# Patient Record
Sex: Male | Born: 1965 | Race: Black or African American | Hispanic: No | Marital: Married | Smoking: Former smoker
Health system: Southern US, Community
[De-identification: ages and names within clinical notes are randomized; demographics above are authoritative.]

## PROBLEM LIST (undated history)

## (undated) VITALS — BP 146/94 | HR 78 | Temp 98.0°F | Resp 18

## (undated) DIAGNOSIS — Z91148 Patient's other noncompliance with medication regimen for other reason: Secondary | ICD-10-CM

## (undated) DIAGNOSIS — Z87891 Personal history of nicotine dependence: Secondary | ICD-10-CM

## (undated) DIAGNOSIS — Z7901 Long term (current) use of anticoagulants: Secondary | ICD-10-CM

## (undated) DIAGNOSIS — I82409 Acute embolism and thrombosis of unspecified deep veins of unspecified lower extremity: Secondary | ICD-10-CM

## (undated) DIAGNOSIS — N400 Enlarged prostate without lower urinary tract symptoms: Secondary | ICD-10-CM

## (undated) DIAGNOSIS — I742 Embolism and thrombosis of arteries of the upper extremities: Secondary | ICD-10-CM

## (undated) DIAGNOSIS — I10 Essential (primary) hypertension: Secondary | ICD-10-CM

## (undated) DIAGNOSIS — Z9114 Patient's other noncompliance with medication regimen: Secondary | ICD-10-CM

## (undated) DIAGNOSIS — F329 Major depressive disorder, single episode, unspecified: Secondary | ICD-10-CM

## (undated) DIAGNOSIS — F32A Depression, unspecified: Secondary | ICD-10-CM

## (undated) DIAGNOSIS — E119 Type 2 diabetes mellitus without complications: Secondary | ICD-10-CM

## (undated) HISTORY — PX: TONSILLECTOMY: SUR1361

---

## 2007-01-27 ENCOUNTER — Emergency Department (HOSPITAL_COMMUNITY): Admission: EM | Admit: 2007-01-27 | Discharge: 2007-01-27 | Payer: Self-pay | Admitting: Emergency Medicine

## 2007-07-26 ENCOUNTER — Emergency Department (HOSPITAL_COMMUNITY): Admission: EM | Admit: 2007-07-26 | Discharge: 2007-07-26 | Payer: Self-pay | Admitting: Family Medicine

## 2007-11-16 ENCOUNTER — Emergency Department (HOSPITAL_COMMUNITY): Admission: EM | Admit: 2007-11-16 | Discharge: 2007-11-17 | Payer: Self-pay | Admitting: Emergency Medicine

## 2009-02-25 ENCOUNTER — Emergency Department (HOSPITAL_COMMUNITY): Admission: EM | Admit: 2009-02-25 | Discharge: 2009-02-25 | Payer: Self-pay | Admitting: Emergency Medicine

## 2010-01-04 ENCOUNTER — Emergency Department (HOSPITAL_COMMUNITY): Admission: EM | Admit: 2010-01-04 | Discharge: 2010-01-05 | Payer: Self-pay | Admitting: Emergency Medicine

## 2010-01-04 ENCOUNTER — Ambulatory Visit: Payer: Self-pay | Admitting: Psychiatry

## 2010-01-05 ENCOUNTER — Inpatient Hospital Stay (HOSPITAL_COMMUNITY)
Admission: EM | Admit: 2010-01-05 | Discharge: 2010-01-10 | Payer: Self-pay | Source: Home / Self Care | Admitting: Psychiatry

## 2010-04-14 ENCOUNTER — Ambulatory Visit (HOSPITAL_COMMUNITY): Payer: Self-pay

## 2010-05-05 ENCOUNTER — Emergency Department (HOSPITAL_COMMUNITY)
Admission: EM | Admit: 2010-05-05 | Discharge: 2010-05-05 | Disposition: A | Discharge status: Home / Self Care | Payer: Self-pay | Attending: Emergency Medicine | Admitting: Emergency Medicine

## 2010-05-05 DIAGNOSIS — S335XXA Sprain of ligaments of lumbar spine, initial encounter: Secondary | ICD-10-CM | POA: Insufficient documentation

## 2010-05-05 DIAGNOSIS — M545 Low back pain, unspecified: Secondary | ICD-10-CM | POA: Insufficient documentation

## 2010-05-05 DIAGNOSIS — X58XXXA Exposure to other specified factors, initial encounter: Secondary | ICD-10-CM | POA: Insufficient documentation

## 2010-05-22 LAB — URINALYSIS, ROUTINE W REFLEX MICROSCOPIC
Bilirubin Urine: NEGATIVE
Nitrite: NEGATIVE
Specific Gravity, Urine: 1.026 (ref 1.005–1.030)
Urobilinogen, UA: 1 mg/dL (ref 0.0–1.0)
pH: 5 (ref 5.0–8.0)

## 2010-05-22 LAB — CBC
HCT: 42.9 % (ref 39.0–52.0)
Hemoglobin: 14.6 g/dL (ref 13.0–17.0)
RDW: 12.7 % (ref 11.5–15.5)
WBC: 6.2 10*3/uL (ref 4.0–10.5)

## 2010-05-22 LAB — BASIC METABOLIC PANEL
Chloride: 105 mEq/L (ref 96–112)
GFR calc non Af Amer: 56 mL/min — ABNORMAL LOW (ref >60)
Potassium: 3.8 mEq/L (ref 3.5–5.1)

## 2010-05-22 LAB — GC/CHLAMYDIA PROBE AMP, URINE

## 2010-05-22 LAB — URINE MICROSCOPIC-ADD ON

## 2010-05-22 LAB — DIFFERENTIAL
Basophils Absolute: 0 10*3/uL (ref 0.0–0.1)
Basophils Relative: 0 % (ref 0–1)
Lymphocytes Relative: 31 % (ref 12–46)
Monocytes Absolute: 0.4 10*3/uL (ref 0.1–1.0)
Monocytes Relative: 6 % (ref 3–12)
Neutro Abs: 3.9 10*3/uL (ref 1.7–7.7)
Neutrophils Relative %: 63 % (ref 43–77)

## 2010-05-22 LAB — HIV ANTIBODY (ROUTINE TESTING W REFLEX): HIV: NONREACTIVE

## 2010-05-22 LAB — RAPID URINE DRUG SCREEN, HOSP PERFORMED
Amphetamines: NOT DETECTED
Opiates: NOT DETECTED
Tetrahydrocannabinol: NOT DETECTED

## 2010-05-22 LAB — ETHANOL

## 2010-05-22 LAB — RPR: RPR Ser Ql: NONREACTIVE

## 2010-06-10 LAB — DIFFERENTIAL
Basophils Absolute: 0 10*3/uL (ref 0.0–0.1)
Basophils Relative: 1 % (ref 0–1)
Lymphocytes Relative: 38 % (ref 12–46)
Monocytes Absolute: 0.5 10*3/uL (ref 0.1–1.0)
Neutro Abs: 3.1 10*3/uL (ref 1.7–7.7)

## 2010-06-10 LAB — BASIC METABOLIC PANEL
BUN: 15 mg/dL (ref 6–23)
Calcium: 9.1 mg/dL (ref 8.4–10.5)
Creatinine, Ser: 1.38 mg/dL (ref 0.4–1.5)
GFR calc non Af Amer: 56 mL/min — ABNORMAL LOW (ref >60)
Glucose, Bld: 96 mg/dL (ref 70–99)
Potassium: 3.5 mEq/L (ref 3.5–5.1)

## 2010-06-10 LAB — POCT CARDIAC MARKERS
Myoglobin, poc: 77.4 ng/mL (ref 12–200)
Troponin i, poc: <0.05 ng/mL (ref 0.00–0.09)
Troponin i, poc: <0.05 ng/mL (ref 0.00–0.09)

## 2010-06-10 LAB — CBC
Hemoglobin: 15 g/dL (ref 13.0–17.0)
MCHC: 34.2 g/dL (ref 30.0–36.0)
Platelets: 242 10*3/uL (ref 150–400)
RDW: 13.6 % (ref 11.5–15.5)

## 2011-01-07 ENCOUNTER — Emergency Department (HOSPITAL_COMMUNITY): Payer: Self-pay

## 2011-01-07 ENCOUNTER — Emergency Department (HOSPITAL_COMMUNITY)
Admission: EM | Admit: 2011-01-07 | Discharge: 2011-01-07 | Disposition: A | Discharge status: Home / Self Care | Payer: Self-pay | Attending: Emergency Medicine | Admitting: Emergency Medicine

## 2011-01-07 DIAGNOSIS — I10 Essential (primary) hypertension: Secondary | ICD-10-CM | POA: Insufficient documentation

## 2011-01-07 DIAGNOSIS — R059 Cough, unspecified: Secondary | ICD-10-CM | POA: Insufficient documentation

## 2011-01-07 DIAGNOSIS — M79609 Pain in unspecified limb: Secondary | ICD-10-CM | POA: Insufficient documentation

## 2011-01-07 DIAGNOSIS — M7989 Other specified soft tissue disorders: Secondary | ICD-10-CM | POA: Insufficient documentation

## 2011-01-07 DIAGNOSIS — R079 Chest pain, unspecified: Secondary | ICD-10-CM | POA: Insufficient documentation

## 2011-01-07 DIAGNOSIS — R05 Cough: Secondary | ICD-10-CM | POA: Insufficient documentation

## 2011-01-07 DIAGNOSIS — F329 Major depressive disorder, single episode, unspecified: Secondary | ICD-10-CM | POA: Insufficient documentation

## 2011-01-07 DIAGNOSIS — R0989 Other specified symptoms and signs involving the circulatory and respiratory systems: Secondary | ICD-10-CM | POA: Insufficient documentation

## 2011-01-07 DIAGNOSIS — Z79899 Other long term (current) drug therapy: Secondary | ICD-10-CM | POA: Insufficient documentation

## 2011-01-07 DIAGNOSIS — R0609 Other forms of dyspnea: Secondary | ICD-10-CM | POA: Insufficient documentation

## 2011-01-07 DIAGNOSIS — J329 Chronic sinusitis, unspecified: Secondary | ICD-10-CM | POA: Insufficient documentation

## 2011-01-07 DIAGNOSIS — R509 Fever, unspecified: Secondary | ICD-10-CM | POA: Insufficient documentation

## 2011-01-07 DIAGNOSIS — J3489 Other specified disorders of nose and nasal sinuses: Secondary | ICD-10-CM | POA: Insufficient documentation

## 2011-01-07 DIAGNOSIS — F3289 Other specified depressive episodes: Secondary | ICD-10-CM | POA: Insufficient documentation

## 2011-01-08 ENCOUNTER — Encounter (HOSPITAL_COMMUNITY): Payer: Self-pay

## 2011-01-08 ENCOUNTER — Inpatient Hospital Stay (HOSPITAL_COMMUNITY)
Admission: EM | Admit: 2011-01-08 | Discharge: 2011-01-16 | DRG: 176 | Disposition: A | Discharge status: Home / Self Care | Payer: Non-veteran care | Attending: Internal Medicine | Admitting: Internal Medicine

## 2011-01-08 ENCOUNTER — Ambulatory Visit (HOSPITAL_COMMUNITY)
Admission: RE | Admit: 2011-01-08 | Discharge: 2011-01-08 | Disposition: A | Discharge status: Home / Self Care | Payer: Non-veteran care | Source: Ambulatory Visit

## 2011-01-08 ENCOUNTER — Encounter (HOSPITAL_COMMUNITY): Payer: Self-pay | Admitting: Radiology

## 2011-01-08 ENCOUNTER — Emergency Department (HOSPITAL_COMMUNITY): Payer: Non-veteran care

## 2011-01-08 DIAGNOSIS — M79609 Pain in unspecified limb: Secondary | ICD-10-CM

## 2011-01-08 DIAGNOSIS — R7309 Other abnormal glucose: Secondary | ICD-10-CM | POA: Diagnosis present

## 2011-01-08 DIAGNOSIS — M7989 Other specified soft tissue disorders: Secondary | ICD-10-CM

## 2011-01-08 DIAGNOSIS — J019 Acute sinusitis, unspecified: Secondary | ICD-10-CM

## 2011-01-08 DIAGNOSIS — Z7901 Long term (current) use of anticoagulants: Secondary | ICD-10-CM

## 2011-01-08 DIAGNOSIS — N400 Enlarged prostate without lower urinary tract symptoms: Secondary | ICD-10-CM | POA: Diagnosis present

## 2011-01-08 DIAGNOSIS — I2699 Other pulmonary embolism without acute cor pulmonale: Principal | ICD-10-CM | POA: Diagnosis present

## 2011-01-08 DIAGNOSIS — I129 Hypertensive chronic kidney disease with stage 1 through stage 4 chronic kidney disease, or unspecified chronic kidney disease: Secondary | ICD-10-CM | POA: Diagnosis present

## 2011-01-08 DIAGNOSIS — I824Z9 Acute embolism and thrombosis of unspecified deep veins of unspecified distal lower extremity: Secondary | ICD-10-CM | POA: Diagnosis present

## 2011-01-08 DIAGNOSIS — I82409 Acute embolism and thrombosis of unspecified deep veins of unspecified lower extremity: Secondary | ICD-10-CM | POA: Diagnosis present

## 2011-01-08 DIAGNOSIS — N182 Chronic kidney disease, stage 2 (mild): Secondary | ICD-10-CM | POA: Diagnosis present

## 2011-01-08 HISTORY — DX: Essential (primary) hypertension: I10

## 2011-01-08 HISTORY — DX: Acute embolism and thrombosis of unspecified deep veins of unspecified lower extremity: I82.409

## 2011-01-08 LAB — DIFFERENTIAL
Basophils Absolute: 0 10*3/uL (ref 0.0–0.1)
Basophils Relative: 0 % (ref 0–1)
Eosinophils Absolute: 0.2 K/uL (ref 0.0–0.7)
Eosinophils Relative: 2 % (ref 0–5)
Lymphocytes Relative: 29 % (ref 12–46)
Lymphs Abs: 2.7 10*3/uL (ref 0.7–4.0)
Monocytes Absolute: 0.7 10*3/uL (ref 0.1–1.0)
Monocytes Relative: 7 % (ref 3–12)
Neutro Abs: 5.8 10*3/uL (ref 1.7–7.7)
Neutrophils Relative %: 62 % (ref 43–77)

## 2011-01-08 LAB — CBC
HCT: 46.5 % (ref 39.0–52.0)
Hemoglobin: 16.2 g/dL (ref 13.0–17.0)
MCH: 32.9 pg (ref 26.0–34.0)
MCHC: 34.8 g/dL (ref 30.0–36.0)
MCV: 94.5 fL (ref 78.0–100.0)
Platelets: 285 K/uL (ref 150–400)
RBC: 4.92 MIL/uL (ref 4.22–5.81)
RDW: 12.6 % (ref 11.5–15.5)
WBC: 9.3 10*3/uL (ref 4.0–10.5)

## 2011-01-08 LAB — BASIC METABOLIC PANEL WITH GFR
BUN: 12 mg/dL (ref 6–23)
Calcium: 10.3 mg/dL (ref 8.4–10.5)
Creatinine, Ser: 1.25 mg/dL (ref 0.50–1.35)
GFR calc non Af Amer: 68 mL/min — ABNORMAL LOW (ref >90)
Glucose, Bld: 100 mg/dL — ABNORMAL HIGH (ref 70–99)
Sodium: 135 meq/L (ref 135–145)

## 2011-01-08 LAB — PROTIME-INR
INR: 0.92 (ref 0.00–1.49)
Prothrombin Time: 12.6 s (ref 11.6–15.2)

## 2011-01-08 LAB — BASIC METABOLIC PANEL
CO2: 28 mEq/L (ref 19–32)
Chloride: 97 mEq/L (ref 96–112)
GFR calc Af Amer: 79 mL/min — ABNORMAL LOW (ref >90)
Potassium: 4.1 mEq/L (ref 3.5–5.1)

## 2011-01-08 LAB — POCT I-STAT TROPONIN I: Troponin i, poc: 0 ng/mL (ref 0.00–0.08)

## 2011-01-08 LAB — APTT: aPTT: 30 seconds (ref 24–37)

## 2011-01-08 MED ORDER — IOHEXOL 350 MG/ML SOLN
80.0000 mL | Freq: Once | INTRAVENOUS | Status: AC | PRN
  Administered 2011-01-08: 80 mL via INTRAVENOUS

## 2011-01-09 DIAGNOSIS — I749 Embolism and thrombosis of unspecified artery: Secondary | ICD-10-CM

## 2011-01-09 LAB — COMPREHENSIVE METABOLIC PANEL
ALT: 38 U/L (ref 0–53)
AST: 22 U/L (ref 0–37)
Alkaline Phosphatase: 67 U/L (ref 39–117)
CO2: 26 mEq/L (ref 19–32)
Calcium: 9.4 mg/dL (ref 8.4–10.5)
Chloride: 99 mEq/L (ref 96–112)
GFR calc Af Amer: 73 mL/min — ABNORMAL LOW (ref >90)
GFR calc non Af Amer: 63 mL/min — ABNORMAL LOW (ref >90)
Glucose, Bld: 102 mg/dL — ABNORMAL HIGH (ref 70–99)
Sodium: 135 mEq/L (ref 135–145)
Total Bilirubin: 0.3 mg/dL (ref 0.3–1.2)

## 2011-01-09 LAB — CBC
Hemoglobin: 13.8 g/dL (ref 13.0–17.0)
MCH: 31.2 pg (ref 26.0–34.0)
MCHC: 33 g/dL (ref 30.0–36.0)
MCV: 94.4 fL (ref 78.0–100.0)

## 2011-01-10 LAB — LUPUS ANTICOAGULANT PANEL
DRVVT: 46.3 secs — ABNORMAL HIGH (ref 34.1–42.2)
Lupus Anticoagulant: NOT DETECTED
PTT Lupus Anticoagulant: 50.9 secs — ABNORMAL HIGH (ref 28.0–43.0)
PTTLA 4:1 Mix: 44.8 secs — ABNORMAL HIGH (ref 28.0–43.0)
PTTLA Confirmation: 3.8 secs (ref <8.0)

## 2011-01-10 LAB — PROTIME-INR
INR: 1.04 (ref 0.00–1.49)
Prothrombin Time: 13.8 s (ref 11.6–15.2)

## 2011-01-10 LAB — BLOOD GAS, ARTERIAL
Bicarbonate: 25.2 mEq/L — ABNORMAL HIGH (ref 20.0–24.0)
TCO2: 26.4 mmol/L (ref 0–100)
pCO2 arterial: 38.8 mmHg (ref 35.0–45.0)
pH, Arterial: 7.429 (ref 7.350–7.450)
pO2, Arterial: 71.7 mmHg — ABNORMAL LOW (ref 80.0–100.0)

## 2011-01-10 LAB — ANTITHROMBIN III: AntiThromb III Func: 100 % (ref 76–126)

## 2011-01-10 LAB — PROTEIN C ACTIVITY: Protein C Activity: 162 % — ABNORMAL HIGH (ref 75–133)

## 2011-01-11 ENCOUNTER — Inpatient Hospital Stay (HOSPITAL_COMMUNITY): Payer: Non-veteran care

## 2011-01-11 LAB — PROTIME-INR
INR: 1.17 (ref 0.00–1.49)
Prothrombin Time: 15.1 seconds (ref 11.6–15.2)

## 2011-01-11 MED ORDER — ACETAMINOPHEN 325 MG PO TABS
650.0000 mg | ORAL_TABLET | ORAL | Status: DC | PRN
Start: 2011-01-11 — End: 2011-01-16

## 2011-01-11 MED ORDER — TRAZODONE 25 MG HALF TABLET
25.0000 mg | ORAL_TABLET | Freq: Every evening | ORAL | Status: DC | PRN
Start: 2011-01-11 — End: 2011-01-16
  Filled 2011-01-11: qty 1

## 2011-01-11 MED ORDER — GUAIFENESIN-DM 100-10 MG/5ML PO SYRP: 5.0000 mL | ORAL_SOLUTION | ORAL | Status: DC | PRN

## 2011-01-11 MED ORDER — TAMSULOSIN HCL 0.4 MG PO CAPS
0.4000 mg | ORAL_CAPSULE | Freq: Every day | ORAL | Status: DC
  Administered 2011-01-12 – 2011-01-15 (×4): 0.4 mg via ORAL
  Filled 2011-01-11 (×6): qty 1

## 2011-01-11 MED ORDER — FLUTICASONE PROPIONATE 50 MCG/ACT NA SUSP
2.0000 | Freq: Every day | NASAL | Status: DC
Start: 2011-01-11 — End: 2011-01-16
  Administered 2011-01-12 – 2011-01-16 (×5): 2 via NASAL

## 2011-01-11 MED ORDER — BISACODYL 10 MG RE SUPP: 10.0000 mg | Freq: Every day | RECTAL | Status: DC | PRN

## 2011-01-11 MED ORDER — ONDANSETRON HCL 4 MG/2ML IJ SOLN: 4.0000 mg | INTRAMUSCULAR | Status: DC | PRN

## 2011-01-11 MED ORDER — LEVOFLOXACIN 750 MG PO TABS
750.0000 mg | ORAL_TABLET | Freq: Every day | ORAL | Status: AC
  Administered 2011-01-12 – 2011-01-15 (×4): 750 mg via ORAL
  Filled 2011-01-11 (×6): qty 1

## 2011-01-11 MED ORDER — ONDANSETRON HCL 4 MG PO TABS: 4.0000 mg | ORAL_TABLET | ORAL | Status: DC | PRN

## 2011-01-11 MED ORDER — SODIUM CHLORIDE 0.9 % IJ SOLN
3.0000 mL | Freq: Two times a day (BID) | INTRAMUSCULAR | Status: DC
  Administered 2011-01-12 – 2011-01-16 (×9): 3 mL via INTRAVENOUS

## 2011-01-11 MED ORDER — ENOXAPARIN SODIUM 100 MG/ML ~~LOC~~ SOLN
90.0000 mg | Freq: Two times a day (BID) | SUBCUTANEOUS | Status: DC
  Administered 2011-01-12 – 2011-01-15 (×8): 90 mg via SUBCUTANEOUS
  Filled 2011-01-11 (×13): qty 1

## 2011-01-11 MED ORDER — DOCUSATE SODIUM 100 MG PO CAPS
100.0000 mg | ORAL_CAPSULE | Freq: Two times a day (BID) | ORAL | Status: DC | PRN
  Filled 2011-01-11: qty 1

## 2011-01-11 MED ORDER — HYDROMORPHONE HCL PF 1 MG/ML IJ SOLN: 0.5000 mg | INTRAMUSCULAR | Status: DC | PRN

## 2011-01-11 MED ORDER — SENNA 8.6 MG PO TABS
2.0000 | ORAL_TABLET | Freq: Every day | ORAL | Status: DC | PRN
  Filled 2011-01-11: qty 2

## 2011-01-11 MED ORDER — OXYCODONE HCL 5 MG PO TABS
5.0000 mg | ORAL_TABLET | ORAL | Status: DC | PRN
  Administered 2011-01-12 – 2011-01-15 (×7): 5 mg via ORAL

## 2011-01-11 NOTE — H&P (Signed)
[name], BUDNICK NO.:  [id number]  MEDICAL RECORD NO.:  [id number]  LOCATION:  4705                         FACILITY:  [facility]  PHYSICIAN:  Vania Rea, M.D. DATE OF BIRTH:  01-03-1966  DATE OF ADMISSION:  01/08/2011 DATE OF DISCHARGE:                             HISTORY & PHYSICAL   PRIMARY CARE PHYSICIAN:  Dr. Carmie Kanner at the [facility] in [place].  CHIEF COMPLAINT:  Swelling of his left leg for the past 8 days.  HISTORY OF PRESENT ILLNESS:  This is a 45 year old African American gentleman who has a history of hypertension and depression but does not know which medications he uses, who reports that he has not been sedentary, has not been on any long journeys, but believes he has a brother with a history of a blood clot in his leg, who reports that he was in good baseline state of health until about 8 or 9 days ago when he developed an upper respiratory infection with coughing and noticed he was also short of breath.  Two days later, he noticed he had pain and swelling of his left leg up to the calf and also some tenderness about the knee.  After seeing no improvement, he called his primary care physician at the [facility] who advised him to come to the emergency room for evaluation.  He presented to the emergency room yesterday where he was felt to have a DVT, received a dose of Lovenox, and was scheduled to have a Doppler ultrasound.  Doppler ultrasound confirmed a DVT and he was called back to the emergency room for further testing since he also gave a history of shortness of breath.  The patient has been in the emergency room since midday.  He eventually had a CT angiogram of the chest which revealed multiple bilateral pulmonary emboli, and the Hospitalist Service was called to assist with management.  The patient does report central chest pain when he coughs.  He is having no hemoptysis, no dizziness or syncope.  No palpitations.  PAST  MEDICAL HISTORY: 1. Hypertension. 2. Depression. 3. History of alcohol abuse. 4. Benign prostatic hypertrophy.  MEDICATIONS:  He reports that he only takes medication for his high blood pressure and a medication for his benign prostatic hypertrophy, but he cannot remember the name.  ALLERGIES:  No known drug allergies.  SOCIAL HISTORY:  Smokes 1 Black and Milds per week.  Admits to drinking 24 ounces of beer 2-3 times per week.  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  He is a Sales executive; left the service in 1996.  FAMILY HISTORY:  Significant for a brother who had a stroke, another brother had diabetes and a blood clots in his legs.  REVIEW OF SYSTEMS:  Other than noted above, unremarkable.  He denies any current depression.  PHYSICAL EXAMINATION:  GENERAL:  Very pleasant, athletic middle-aged African American gentleman, lying in a stretcher, appears mildly dehydrated. VITAL SIGNS:  His temperature is 99, his pulse is 78, respirations 20, blood pressure 130/66.  He is saturating at 94% on  room air. HEENT:  His pupils are round and equal.  Mucous membranes are pink, mildly dry.  Anicteric.  No cervical lymphadenopathy.  No thyromegaly or carotid bruit. CHEST:  Clear to auscultation bilaterally. CARDIOVASCULAR:  Regular rhythm. ABDOMEN:  Soft, nontender.  No masses. EXTREMITIES:  His left leg is tender and tense and swollen at the level of the knee with marked calf tenderness, but dorsalis pedis pulses are 2+ bilaterally.  He does have dilated veins in the left leg. CENTRAL NERVOUS SYSTEM:  Cranial nerves II through XII are grossly intact.  He has no focal lateralizing signs.  LABORATORY DATA:  His white count is 9.3, hemoglobin 16.2, platelets 285.  His sodium is 135, potassium 4.1, chloride 97, CO2 28, glucose 100, BUN 12 creatinine 1.25, calcium is 10.3.  His PT is 12.6 and his PTT is 30.  His  troponin is undetectable.  CT angiogram of his chest shows multiple bilateral pulmonary emboli, particularly in the lower lobe pulmonary arterial tree, but right more than left.  ASSESSMENTS: 1. Venous thromboembolism with bilateral pulmonary emboli and left     lower extremity deep vein thrombosis, unclear precipitating     factors. 2. Hypertension.  Medications unknown. 3. History of depression. 4. History of benign prostatic hypertrophy.  PLAN: 1. We will admit this gentleman for anticoagulation and     hypercoagulability workup. 2. We will also ensure adequate pain control. 3. We will have pharmacy track down his medications and give     prescriptions and adjustments as indicated. 4. Other plans as per orders.     Vania Rea, M.D.     LC/MEDQ  D:  01/09/2011  T:  01/09/2011  Job:  [id number]  cc:   Dr. Carmie Kanner  Electronically Signed by Vania Rea M.D. on 01/11/2011 12:27:44 PM

## 2011-01-12 ENCOUNTER — Encounter (HOSPITAL_COMMUNITY): Payer: Self-pay | Admitting: Internal Medicine

## 2011-01-12 DIAGNOSIS — I82409 Acute embolism and thrombosis of unspecified deep veins of unspecified lower extremity: Secondary | ICD-10-CM

## 2011-01-12 DIAGNOSIS — I2699 Other pulmonary embolism without acute cor pulmonale: Secondary | ICD-10-CM | POA: Diagnosis present

## 2011-01-12 DIAGNOSIS — J019 Acute sinusitis, unspecified: Secondary | ICD-10-CM

## 2011-01-12 HISTORY — DX: Acute embolism and thrombosis of unspecified deep veins of unspecified lower extremity: I82.409

## 2011-01-12 LAB — PROTIME-INR
INR: 1.38 (ref 0.00–1.49)
Prothrombin Time: 17.2 seconds — ABNORMAL HIGH (ref 11.6–15.2)

## 2011-01-12 LAB — CBC
Platelets: 345 10*3/uL (ref 150–400)
RBC: 4.79 MIL/uL (ref 4.22–5.81)
WBC: 6.6 10*3/uL (ref 4.0–10.5)

## 2011-01-12 MED ORDER — WARFARIN SODIUM 2.5 MG PO TABS
12.5000 mg | ORAL_TABLET | Freq: Once | ORAL | Status: AC
  Administered 2011-01-12: 12.5 mg via ORAL
  Filled 2011-01-12: qty 1

## 2011-01-12 NOTE — Progress Notes (Signed)
Subjective: [name] was admitted on October 31 for swelling and pain in his left leg and dyspnea. He was diagnosed to have left lower extremity deep vein thrombosis and acute bilateral pulmonary embolism confirmed on CT angiogram of the chest. He has been started on Lovenox full dose bridging and Coumadin. Dyspnea on exertion continues to improve daily. He denies chest pains. His left leg pain and swelling are decreased. He still has some pain on weightbearing. Yesterday his headaches are improved. He feels less stuffy in his nose and head.  Objective: Blood pressure 127/71, pulse 86, temperature 98.7 F (37.1 C), resp. rate 18, height 5' 7.5" (1.715 m), weight 89.585 kg (197 lb 8 oz).  Intake/Output Summary (Last 24 hours) at 01/12/11 0956 Last data filed at 01/12/11 0700  Gross per 24 hour  Intake    240 ml  Output    851 ml  Net   -611 ml   Gen. exam: Comfortable with no increased work of breathing Respiratory system: clear Cardiovascular system: First and second heart sounds heard, regular Extremities: Left leg pain and swelling are improved. He still has mild tenderness and cordlike feeling in the medial aspect of the left lower thigh. Peripheral pulses are well felt symmetrically. Color is normal.  Lab Results: Basic Metabolic Panel: No results found for this basename: NA:2,K:2,CL:2,CO2:2,GLUCOSE:2,BUN:2,CREATININE:2,CALCIUM:2,MG:2,PHOS:2 in the last 72 hours Liver Function Tests: No results found for this basename: AST:2,ALT:2,ALKPHOS:2,BILITOT:2,PROT:2,ALBUMIN:2 in the last 72 hours No results found for this basename: LIPASE:2,AMYLASE:2 in the last 72 hours No results found for this basename: AMMONIA:2 in the last 72 hours CBC:  Basename 01/12/11 0655  WBC 6.6  NEUTROABS --  HGB 15.5  HCT 44.6  MCV 93.1  PLT 345   Cardiac Enzymes: No results found for this basename: CKTOTAL:3,CKMB:3,CKMBINDEX:3,TROPONINI:3 in the last 72 hours BNP: No results found for this  basename: POCBNP:3 in the last 72 hours D-Dimer: No results found for this basename: DDIMER:2 in the last 72 hours CBG: No results found for this basename: GLUCAP:6 in the last 72 hours Hemoglobin A1C: No results found for this basename: HGBA1C in the last 72 hours Fasting Lipid Panel: No results found for this basename: CHOL,HDL,LDLCALC,TRIG,CHOLHDL,LDLDIRECT in the last 72 hours Thyroid Function Tests: No results found for this basename: TSH,T4TOTAL,FREET4,T3FREE,THYROIDAB in the last 72 hours Anemia Panel: No results found for this basename: VITAMINB12,FOLATE,FERRITIN,TIBC,IRON,RETICCTPCT in the last 72 hours Coagulation:  Basename 01/11/11 0630 01/10/11 0630  INR 1.17 1.04   Urine Drug Screen:  Alcohol Level: No results found for this basename: ETH:2 in the last 72 hours Urinalysis:  Misc. Labs:   Micro Results: No results found for this or any previous visit (from the past 240 hour(s)).  Studies/Results: Dg Chest 2 View  01/07/2011  *RADIOLOGY REPORT*  Clinical Data: Cough, hypertension.  CHEST - 2 VIEW  Comparison: 02/25/2009  Findings: Heart and mediastinal contours are within normal limits. No focal opacities or effusions.  No acute bony abnormality.  IMPRESSION: No active cardiopulmonary disease.  Original Report Authenticated By: Cyndie Chime, M.D.   Ct Head Wo Contrast  01/11/2011  *RADIOLOGY REPORT*  Clinical Data: Right frontal headache, eye pain  CT HEAD WITHOUT CONTRAST  Technique:  Contiguous axial images were obtained from the base of the skull through the vertex without contrast.  Comparison: None.  Findings: No evidence of parenchymal hemorrhage or extra-axial fluid collection. No mass lesion, mass effect, or midline shift.  No CT evidence of acute infarction.  Cerebral volume is age appropriate.  No ventriculomegaly.  Near complete opacification of the left maxillary sinus.  Fluid level in the right maxillary sinus.  Partial opacification of the ethmoid  sinuses.  Mastoid air cells are clear.  No evidence of calvarial fracture.  IMPRESSION: No evidence of acute intracranial abnormality.  Partial opacification of the bilateral maxillary and ethmoid sinuses, as described above.  Original Report Authenticated By: Charline Bills, M.D.   Ct Angio Chest W/cm &/or Wo Cm  01/08/2011  *RADIOLOGY REPORT*  Clinical Data:  Left leg DVT.  Chest pain and shortness of breath.  CT ANGIOGRAPHY CHEST WITH CONTRAST  Technique:  Multidetector CT imaging of the chest was performed using the standard protocol during bolus administration of intravenous contrast.  Multiplanar CT image reconstructions including MIPs were obtained to evaluate the vascular anatomy.  Contrast: 80mL OMNIPAQUE IOHEXOL 350 MG/ML IV SOLN  Comparison:  Chest radiography 10/13  Findings:  The study is positive for multiple bilateral pulmonary emboli, most pronounced in the lower lobe arterial tree right more than left.  The lungs remain clear.  No collapse, effusion or infarction.  No pericardial fluid.  The aorta appears unremarkable. No hilar mediastinal mass.  Upper abdominal structures appear normal as seen.  Review of the MIP images confirms the above findings.  IMPRESSION: The study is positive for multiple bilateral pulmonary emboli, particularly in the lower lobe pulmonary arterial tree and right more than left.Critical Value/emergent results were called by telephone at the time of interpretation on 01/08/2011  at 2140 hours  to  [facility] EDP, who verbally acknowledged these results.  Original Report Authenticated By: Thomasenia Sales, M.D.    Medications: Scheduled Meds:   . enoxaparin (LOVENOX) injection  90 mg Subcutaneous Q12H  . fluticasone  2 spray Each Nare Daily  . levofloxacin  750 mg Oral Daily  . sodium chloride  3 mL Intravenous Q12H  . Tamsulosin HCl  0.4 mg Oral QHS   Continuous Infusions:  PRN Meds:.acetaminophen, bisacodyl, docusate sodium, guaiFENesin-dextromethorphan,  HYDROmorphone (DILAUDID) injection, ondansetron, ondansetron, oxyCODONE, senna, traZODone  Assessment/Plan: Patient Active Hospital Problem List: 1. Acute bilateral pulmonary embolism: Hemodynamically stable. Day 4 for 5 Lovenox bridging. Continue full dose Lovenox and Coumadin as per pharmacy recommendations. 2. Left lower extremity deep vein thrombosis: Continue anticoagulation as above 3. Acute sinusitis: Possibly cause of his headaches. No other acute intracranial abnormality on CT. Patient is day 2 of 5 days levofloxacin. Headache is better. 4. Chronic kidney disease stage II: Stable 5. Hypertension: Controlled without medications.  No active hospital problems.    Tery Hoeger 01/12/2011, 9:56 AM

## 2011-01-12 NOTE — Progress Notes (Signed)
ANTICOAGULATION CONSULT NOTE - Follow Up Consult  Pharmacy Consult for Lovenox/coumadin Indication: pulmonary embolus and DVT  No Known Allergies  Patient Measurements: Height: 5' 7.5" (171.5 cm) (Entered for [employer]) Weight: 197 lb 8 oz (89.585 kg) IBW/kg (Calculated) : 67.25   Vital Signs: Temp: 98.7 F (37.1 C) (11/04 0500) BP: 127/71 mmHg (11/04 0500) Pulse Rate: 86  (11/04 0500)  Labs:  Basename 01/12/11 1020 01/12/11 0655 01/11/11 0630 01/10/11 0630  HGB -- 15.5 -- --  HCT -- 44.6 -- --  PLT -- 345 -- --  APTT -- -- -- --  LABPROT 17.2* -- 15.1 13.8  INR 1.38 -- 1.17 1.04  HEPARINUNFRC -- -- -- --  CREATININE -- -- -- --  CKTOTAL -- -- -- --  CKMB -- -- -- --  TROPONINI -- -- -- --   Estimated Creatinine Clearance: 75.6 ml/min (by C-G formula based on Cr of 1.33).   Medications:   Assessment: 44 YOM on D#5 Lovenox bridge to coumadin for acute B/L PE and LLE DVT, INR remains subtherapeutic but trending up. CBC stable, no bleeding noted. Pt. Renal fx has been stable.  Goal of Therapy:  INR 2-3   Plan:  1) Continue Lovenox 90mg  sq q12hr 2) Give coumadin 12.5mg  this evening 3) Continue to f/u PT/INR, and d/c lovenox when INR is > 2 for 24 hrs  Riki Rusk 01/12/2011,11:44 AM

## 2011-01-13 LAB — PROTIME-INR
INR: 1.43 (ref 0.00–1.49)
Prothrombin Time: 17.7 seconds — ABNORMAL HIGH (ref 11.6–15.2)

## 2011-01-13 MED ORDER — WARFARIN SODIUM 7.5 MG PO TABS
15.0000 mg | ORAL_TABLET | Freq: Once | ORAL | Status: AC
  Administered 2011-01-13: 15 mg via ORAL
  Filled 2011-01-13: qty 2

## 2011-01-13 NOTE — Progress Notes (Signed)
ANTICOAGULATION CONSULT NOTE - Follow Up Consult  Pharmacy Consult for Lovenox/Coumadin Indication: Bilateral PE, DVT  No Known Allergies  Patient Measurements: Height: 5' 7.5" (171.5 cm) (Entered for [employer]) Weight: 196 lb 14.4 oz (89.313 kg) IBW/kg (Calculated) : 67.25  Adjusted Body Weight  Vital Signs: Temp: 98 F (36.7 C) (11/05 0530) Temp src: Oral (11/05 0530) BP: 123/84 mmHg (11/05 0530) Pulse Rate: 79  (11/05 0530)  Labs:  Basename 01/13/11 0605 01/12/11 1020 01/12/11 0655 01/11/11 0630  HGB -- -- 15.5 --  HCT -- -- 44.6 --  PLT -- -- 345 --  APTT -- -- -- --  LABPROT 17.7* 17.2* -- 15.1  INR 1.43 1.38 -- 1.17  HEPARINUNFRC -- -- -- --  CREATININE -- -- -- --  CKTOTAL -- -- -- --  CKMB -- -- -- --  TROPONINI -- -- -- --   Estimated Creatinine Clearance: 75.5 ml/min (by C-G formula based on Cr of 1.33).  Goal of Therapy:  INR 2-3  Assessment: 44yom on Lovenox bridging to Coumadin (Day 6) for bilateral PE and DVT treatment. INR remains subtherapeutic and not responding significantly to Coumadin 12.5mg  doses.No bleeding reported. Wt 89kg, CrCl 17ml/min   Plan:  1. Coumadin 15mg  po x1 today 2. Continue Lovenox 90mg  SQ q12h 3. F/U AM INR  Cleon Dew 01/13/2011,9:17 AM

## 2011-01-13 NOTE — Progress Notes (Signed)
PT SEES DR. SALMON AT [facility], CHECKING TO MAKE SURE SHE CAN FOLLOW OP LABS.  WILL F/U.  Willa Rough [phone] OR [phone] 01/13/2011

## 2011-01-13 NOTE — Progress Notes (Signed)
Subjective:   Overall patient indicates that he feels better. There is improvement in his headaches, dyspnea and left leg pain. He still has dyspnea on exertion on ambulating to the bathroom or talking a lot.  [name] was admitted on October 31 for swelling and pain in his left leg and dyspnea. He was diagnosed to have left lower extremity deep vein thrombosis and acute bilateral pulmonary embolism confirmed on CT angiogram of the chest. He has been started on Lovenox full dose bridging and Coumadin.  Objective: Blood pressure 140/85, pulse 93, temperature 98.6 F (37 C), temperature source Oral, resp. rate 18, height 5' 7.5" (1.715 m), weight 89.313 kg (196 lb 14.4 oz), SpO2 98.00%.  Intake/Output Summary (Last 24 hours) at 01/13/11 1740 Last data filed at 01/13/11 1500  Gross per 24 hour  Intake    723 ml  Output   1050 ml  Net   -327 ml   Gen. exam: Comfortable with no increased work of breathing Respiratory system: clear Cardiovascular system: First and second heart sounds heard, regular Extremities: Left leg pain and swelling are improved. He still has mild tenderness and cordlike feeling in the medial aspect of the left lower thigh. Peripheral pulses are well felt symmetrically. Color is normal.  Lab Results: Basic Metabolic Panel: No results found for this basename: NA:2,K:2,CL:2,CO2:2,GLUCOSE:2,BUN:2,CREATININE:2,CALCIUM:2,MG:2,PHOS:2 in the last 72 hours Liver Function Tests: No results found for this basename: AST:2,ALT:2,ALKPHOS:2,BILITOT:2,PROT:2,ALBUMIN:2 in the last 72 hours No results found for this basename: LIPASE:2,AMYLASE:2 in the last 72 hours No results found for this basename: AMMONIA:2 in the last 72 hours CBC:  Basename 01/12/11 0655  WBC 6.6  NEUTROABS --  HGB 15.5  HCT 44.6  MCV 93.1  PLT 345   Cardiac Enzymes: No results found for this basename: CKTOTAL:3,CKMB:3,CKMBINDEX:3,TROPONINI:3 in the last 72 hours BNP: No results found for this  basename: POCBNP:3 in the last 72 hours D-Dimer: No results found for this basename: DDIMER:2 in the last 72 hours CBG: No results found for this basename: GLUCAP:6 in the last 72 hours Hemoglobin A1C: No results found for this basename: HGBA1C in the last 72 hours Fasting Lipid Panel: No results found for this basename: CHOL,HDL,LDLCALC,TRIG,CHOLHDL,LDLDIRECT in the last 72 hours Thyroid Function Tests: No results found for this basename: TSH,T4TOTAL,FREET4,T3FREE,THYROIDAB in the last 72 hours Anemia Panel: No results found for this basename: VITAMINB12,FOLATE,FERRITIN,TIBC,IRON,RETICCTPCT in the last 72 hours Coagulation:  Basename 01/13/11 0605 01/12/11 1020  INR 1.43 1.38   Urine Drug Screen:  Alcohol Level: No results found for this basename: ETH:2 in the last 72 hours Urinalysis:  Misc. Labs:   Micro Results: No results found for this or any previous visit (from the past 240 hour(s)).  Studies/Results: Dg Chest 2 View  01/07/2011  *RADIOLOGY REPORT*  Clinical Data: Cough, hypertension.  CHEST - 2 VIEW  Comparison: 02/25/2009  Findings: Heart and mediastinal contours are within normal limits. No focal opacities or effusions.  No acute bony abnormality.  IMPRESSION: No active cardiopulmonary disease.  Original Report Authenticated By: Cyndie Chime, M.D.   Ct Head Wo Contrast  01/11/2011  *RADIOLOGY REPORT*  Clinical Data: Right frontal headache, eye pain  CT HEAD WITHOUT CONTRAST  Technique:  Contiguous axial images were obtained from the base of the skull through the vertex without contrast.  Comparison: None.  Findings: No evidence of parenchymal hemorrhage or extra-axial fluid collection. No mass lesion, mass effect, or midline shift.  No CT evidence of acute infarction.  Cerebral volume is age appropriate.  No ventriculomegaly.  Near complete opacification of the left maxillary sinus.  Fluid level in the right maxillary sinus.  Partial opacification of the ethmoid  sinuses.  Mastoid air cells are clear.  No evidence of calvarial fracture.  IMPRESSION: No evidence of acute intracranial abnormality.  Partial opacification of the bilateral maxillary and ethmoid sinuses, as described above.  Original Report Authenticated By: Charline Bills, M.D.   Ct Angio Chest W/cm &/or Wo Cm  01/08/2011  *RADIOLOGY REPORT*  Clinical Data:  Left leg DVT.  Chest pain and shortness of breath.  CT ANGIOGRAPHY CHEST WITH CONTRAST  Technique:  Multidetector CT imaging of the chest was performed using the standard protocol during bolus administration of intravenous contrast.  Multiplanar CT image reconstructions including MIPs were obtained to evaluate the vascular anatomy.  Contrast: 80mL OMNIPAQUE IOHEXOL 350 MG/ML IV SOLN  Comparison:  Chest radiography 10/13  Findings:  The study is positive for multiple bilateral pulmonary emboli, most pronounced in the lower lobe arterial tree right more than left.  The lungs remain clear.  No collapse, effusion or infarction.  No pericardial fluid.  The aorta appears unremarkable. No hilar mediastinal mass.  Upper abdominal structures appear normal as seen.  Review of the MIP images confirms the above findings.  IMPRESSION: The study is positive for multiple bilateral pulmonary emboli, particularly in the lower lobe pulmonary arterial tree and right more than left.Critical Value/emergent results were called by telephone at the time of interpretation on 01/08/2011  at 2140 hours  to  [facility] EDP, who verbally acknowledged these results.  Original Report Authenticated By: Thomasenia Sales, M.D.    Medications: Scheduled Meds:    . enoxaparin (LOVENOX) injection  90 mg Subcutaneous Q12H  . fluticasone  2 spray Each Nare Daily  . levofloxacin  750 mg Oral Daily  . sodium chloride  3 mL Intravenous Q12H  . Tamsulosin HCl  0.4 mg Oral QHS  . warfarin  12.5 mg Oral ONCE-1800  . warfarin  15 mg Oral ONCE-1800   Continuous Infusions:  PRN  Meds:.acetaminophen, bisacodyl, docusate sodium, guaiFENesin-dextromethorphan, HYDROmorphone (DILAUDID) injection, ondansetron, ondansetron, oxyCODONE, senna, traZODone  Assessment/Plan: Patient Active Hospital Problem List: 1. Acute bilateral pulmonary embolism: Hemodynamically stable. Day 5 for 5 Lovenox bridging. Continue full dose Lovenox and Coumadin as per pharmacy recommendations. The INR has finally started to rise. He should be therapeutic in in the next 48 hours. 2. Left lower extremity deep vein thrombosis: Continue anticoagulation as above 3. Acute sinusitis: Possibly cause of his headaches. No other acute intracranial abnormality on CT. Patient is day 3 of 5 days levofloxacin. Headache is better. 4. Chronic kidney disease stage II: Stable 5.    Hypertension: Controlled without medications.   Disposition: Patient should be able to discharge on 7 November.  [name],[name] 01/13/2011, 5:40 PM

## 2011-01-14 LAB — CBC
HCT: 43.2 % (ref 39.0–52.0)
Hemoglobin: 15.7 g/dL (ref 13.0–17.0)
MCH: 33.6 pg (ref 26.0–34.0)
MCHC: 36.3 g/dL — ABNORMAL HIGH (ref 30.0–36.0)

## 2011-01-14 LAB — CARDIOLIPIN ANTIBODIES, IGG, IGM, IGA
Anticardiolipin IgG: 8 GPL U/mL — ABNORMAL LOW (ref <23)
Anticardiolipin IgM: 5 MPL U/mL — ABNORMAL LOW (ref <11)

## 2011-01-14 MED ORDER — WARFARIN SODIUM 7.5 MG PO TABS
15.0000 mg | ORAL_TABLET | Freq: Once | ORAL | Status: AC
  Administered 2011-01-14: 15 mg via ORAL
  Filled 2011-01-14: qty 2

## 2011-01-14 MED ORDER — LORATADINE 10 MG PO TABS
10.0000 mg | ORAL_TABLET | Freq: Every day | ORAL | Status: DC
  Administered 2011-01-14 – 2011-01-16 (×4): 10 mg via ORAL
  Filled 2011-01-14 (×4): qty 1

## 2011-01-14 NOTE — Progress Notes (Signed)
Summary: [name] was admitted on October 31 for swelling and pain in his left leg and dyspnea. He had dyspnea both at rest and on minimal exertion and left lower extremity pain. He was diagnosed to have left lower extremity deep vein thrombosis and acute bilateral pulmonary embolism confirmed on CT angiogram of the chest. He has been started on Lovenox full dose bridging and Coumadin. He is day #7 of Lovenox Coumadin bridging. His dyspnea on exertion and left lower extremity pain continue to improve but have not fully resolved.Marland Kitchen He was managed as inpatient because of how severely symptomatic he was on admission. He continues to improve and should be stable for discharge in the next 24-48 hours. His Lovenox will be discontinued after his INR has been therapeutic on 2 consecutive days. He has symptoms suggestive of chronic sinusitis but had worsening nasal congestion and headache on admission attribute it to possible acute sinusitis. This is improving with antibiotics. Patient follows up with primary care physician at the [facility]. Arrangements are to be made on discharge for close monitoring of his INR as outpatient.     Subjective: Dyspnea on exertion, left lower extremity pain and headache all continued to improve. He has some dyspnea on exertion on ambulating to the bathroom. Left lower extremity pain is more on ambulation. He complains of some postnasal drip but nasal stuffiness has improved.  Objective: Blood pressure 127/82, pulse 89, temperature 98.5 F (36.9 C), temperature source Oral, resp. rate 18, height 5' 7.5" (1.715 m), weight 90.1 kg (198 lb 10.2 oz), SpO2 96.00%.  Intake/Output Summary (Last 24 hours) at 01/14/11 1325 Last data filed at 01/14/11 1100  Gross per 24 hour  Intake    603 ml  Output   1575 ml  Net   -972 ml   Gen. exam: Comfortable and in no obvious distress. Respiratory system: Clear. Cardiovascular system: First and second heart sounds heard regular. Nor  JVD. Extremities: Left lower extremity swelling has resolved. It is too asymmetrically mildly warm but improved. Mild tenderness in the medial left lower thigh. Central nervous system: Awake alert and oriented  Lab Results: Basic Metabolic Panel: No results found for this basename: NA:2,K:2,CL:2,CO2:2,GLUCOSE:2,BUN:2,CREATININE:2,CALCIUM:2,MG:2,PHOS:2 in the last 72 hours Liver Function Tests: No results found for this basename: AST:2,ALT:2,ALKPHOS:2,BILITOT:2,PROT:2,ALBUMIN:2 in the last 72 hours No results found for this basename: LIPASE:2,AMYLASE:2 in the last 72 hours No results found for this basename: AMMONIA:2 in the last 72 hours CBC:  Basename 01/14/11 0635 01/12/11 0655  WBC 6.0 6.6  NEUTROABS -- --  HGB 15.7 15.5  HCT 43.2 44.6  MCV 92.5 93.1  PLT 342 345   Cardiac Enzymes: No results found for this basename: CKTOTAL:3,CKMB:3,CKMBINDEX:3,TROPONINI:3 in the last 72 hours BNP: No results found for this basename: POCBNP:3 in the last 72 hours D-Dimer: No results found for this basename: DDIMER:2 in the last 72 hours CBG: No results found for this basename: GLUCAP:6 in the last 72 hours Hemoglobin A1C: No results found for this basename: HGBA1C in the last 72 hours Fasting Lipid Panel: No results found for this basename: CHOL,HDL,LDLCALC,TRIG,CHOLHDL,LDLDIRECT in the last 72 hours Thyroid Function Tests: No results found for this basename: TSH,T4TOTAL,FREET4,T3FREE,THYROIDAB in the last 72 hours Anemia Panel: No results found for this basename: VITAMINB12,FOLATE,FERRITIN,TIBC,IRON,RETICCTPCT in the last 72 hours Coagulation:  Basename 01/14/11 0635 01/13/11 0605  LABPROT 20.2* 17.7*  INR 1.69* 1.43   Urine Drug Screen:  Alcohol Level: No results found for this basename: ETH:2 in the last 72 hours Urinalysis:  Misc. Labs:   Micro Results: No results found for this or any previous visit (from the past 240 hour(s)).  Studies/Results: Dg Chest 2  View  01/07/2011  *RADIOLOGY REPORT*  Clinical Data: Cough, hypertension.  CHEST - 2 VIEW  Comparison: 02/25/2009  Findings: Heart and mediastinal contours are within normal limits. No focal opacities or effusions.  No acute bony abnormality.  IMPRESSION: No active cardiopulmonary disease.  Original Report Authenticated By: Cyndie Chime, M.D.   Ct Head Wo Contrast  01/11/2011  *RADIOLOGY REPORT*  Clinical Data: Right frontal headache, eye pain  CT HEAD WITHOUT CONTRAST  Technique:  Contiguous axial images were obtained from the base of the skull through the vertex without contrast.  Comparison: None.  Findings: No evidence of parenchymal hemorrhage or extra-axial fluid collection. No mass lesion, mass effect, or midline shift.  No CT evidence of acute infarction.  Cerebral volume is age appropriate.  No ventriculomegaly.  Near complete opacification of the left maxillary sinus.  Fluid level in the right maxillary sinus.  Partial opacification of the ethmoid sinuses.  Mastoid air cells are clear.  No evidence of calvarial fracture.  IMPRESSION: No evidence of acute intracranial abnormality.  Partial opacification of the bilateral maxillary and ethmoid sinuses, as described above.  Original Report Authenticated By: Charline Bills, M.D.   Ct Angio Chest W/cm &/or Wo Cm  01/08/2011  *RADIOLOGY REPORT*  Clinical Data:  Left leg DVT.  Chest pain and shortness of breath.  CT ANGIOGRAPHY CHEST WITH CONTRAST  Technique:  Multidetector CT imaging of the chest was performed using the standard protocol during bolus administration of intravenous contrast.  Multiplanar CT image reconstructions including MIPs were obtained to evaluate the vascular anatomy.  Contrast: 80mL OMNIPAQUE IOHEXOL 350 MG/ML IV SOLN  Comparison:  Chest radiography 10/13  Findings:  The study is positive for multiple bilateral pulmonary emboli, most pronounced in the lower lobe arterial tree right more than left.  The lungs remain clear.  No  collapse, effusion or infarction.  No pericardial fluid.  The aorta appears unremarkable. No hilar mediastinal mass.  Upper abdominal structures appear normal as seen.  Review of the MIP images confirms the above findings.  IMPRESSION: The study is positive for multiple bilateral pulmonary emboli, particularly in the lower lobe pulmonary arterial tree and right more than left.Critical Value/emergent results were called by telephone at the time of interpretation on 01/08/2011  at 2140 hours  to  [facility] EDP, who verbally acknowledged these results.  Original Report Authenticated By: Thomasenia Sales, M.D.    Medications: Scheduled Meds:   . enoxaparin (LOVENOX) injection  90 mg Subcutaneous Q12H  . fluticasone  2 spray Each Nare Daily  . levofloxacin  750 mg Oral Daily  . loratadine  10 mg Oral Daily  . sodium chloride  3 mL Intravenous Q12H  . Tamsulosin HCl  0.4 mg Oral QHS  . warfarin  15 mg Oral ONCE-1800  . warfarin  15 mg Oral ONCE-1800   Continuous Infusions:  PRN Meds:.acetaminophen, bisacodyl, docusate sodium, guaiFENesin-dextromethorphan, HYDROmorphone (DILAUDID) injection, ondansetron, ondansetron, oxyCODONE, senna, traZODone  Assessment/Plan Patient Active Hospital Problem List: 1. Acute bilateral pulmonary embolism: Hemodynamically stable. INR continues to gradually increase. Discontinue Lovenox after his INR is therapeutic for 2 consecutive days. 2. Left lower extremity deep vein thrombosis: Continue anticoagulation as above 3. Acute sinusitis: Possibly cause of his headaches. No other acute intracranial abnormality on CT. Patient is day 4 of 5 days levofloxacin. Headache is better.  4. Chronic kidney disease stage II: Stable 5. Hypertension: Controlled without medications.    [name] 01/14/2011, 1:25 PM

## 2011-01-14 NOTE — Progress Notes (Signed)
ANTICOAGULATION CONSULT NOTE - Follow Up Consult  Pharmacy Consult for Lovenox and Coumadin Indication: pulmonary embolus and DVT  No Known Allergies  Patient Measurements: Height: 5' 7.5" (171.5 cm) (Entered for Cutover) Weight: 198 lb 10.2 oz (90.1 kg) (scale a) IBW/kg (Calculated) : 67.25  Adjusted Body Weight:   Vital Signs: Temp: 98.5 F (36.9 C) (11/06 0921) Temp src: Oral (11/06 0921) BP: 127/82 mmHg (11/06 0921) Pulse Rate: 89  (11/06 0921)  Labs:  Basename 01/14/11 0635 01/13/11 0605 01/12/11 1020 01/12/11 0655  HGB 15.7 -- -- 15.5  HCT 43.2 -- -- 44.6  PLT 342 -- -- 345  APTT -- -- -- --  LABPROT 20.2* 17.7* 17.2* --  INR 1.69* 1.43 1.38 --  HEPARINUNFRC -- -- -- --  CREATININE -- -- -- --  CKTOTAL -- -- -- --  CKMB -- -- -- --  TROPONINI -- -- -- --   Estimated Creatinine Clearance: 75.8 ml/min (by C-G formula based on Cr of 1.33).   Medications:  Scheduled:    . enoxaparin (LOVENOX) injection  90 mg Subcutaneous Q12H  . fluticasone  2 spray Each Nare Daily  . levofloxacin  750 mg Oral Daily  . loratadine  10 mg Oral Daily  . sodium chloride  3 mL Intravenous Q12H  . Tamsulosin HCl  0.4 mg Oral QHS  . warfarin  15 mg Oral ONCE-1800    Assessment: 44yom on Lovenox bridging to Coumadin (Day 7) for bilateral PE and DVT treatment. INR is subtherapeutic (1.69) but trended up appropriately with 15mg  dose.  - Wt 90kg - CrCl 68ml/min  Goal of Therapy:  INR 2-3; Anti-Xa level: 0.6-1.2   Plan:  1. Coumadin 15mg  po x 1 2. Continue Lovenox 90mg  (1mg /kg) SQ q12h 3. Check BMET in AM  Cleon Dew [phone] 01/14/2011,11:42 AM

## 2011-01-14 NOTE — Progress Notes (Signed)
Clinical Social Work completed the psychosocial assessment which can be found in the shadow chart. Pt given resources for [employer] and the [employer] for financial resources while patient is out of work. No further CSW needs, signing off.   Catha Gosselin,  Theresia Majors [phone]

## 2011-01-15 ENCOUNTER — Encounter (HOSPITAL_COMMUNITY): Payer: Self-pay | Admitting: *Deleted

## 2011-01-15 LAB — BASIC METABOLIC PANEL
BUN: 17 mg/dL (ref 6–23)
CO2: 25 mEq/L (ref 19–32)
Calcium: 9.3 mg/dL (ref 8.4–10.5)
Creatinine, Ser: 1.32 mg/dL (ref 0.50–1.35)
GFR calc Af Amer: 74 mL/min — ABNORMAL LOW (ref >90)

## 2011-01-15 LAB — PROTIME-INR
INR: 1.96 — ABNORMAL HIGH (ref 0.00–1.49)
Prothrombin Time: 22.7 seconds — ABNORMAL HIGH (ref 11.6–15.2)

## 2011-01-15 MED ORDER — WARFARIN SODIUM 7.5 MG PO TABS
15.0000 mg | ORAL_TABLET | Freq: Once | ORAL | Status: AC
  Administered 2011-01-15: 15 mg via ORAL
  Filled 2011-01-15: qty 2

## 2011-01-15 NOTE — Clinical Documentation Improvement (Signed)
While training, I co-signed this chart in error.  Terrance Mass

## 2011-01-15 NOTE — Progress Notes (Signed)
Subjective: Patient seen and examined this morning. he informs having some shortness of breath on exertion however feels much better. Denies chest pain. No overnight issues.  Objective:  Vital signs in last 24 hours:  Filed Vitals:   01/14/11 0921 01/14/11 1405 01/14/11 2157 01/15/11 0529  BP: 127/82 144/69 128/78 116/78  Pulse: 89 69 96 85  Temp: 98.5 F (36.9 C) 98.7 F (37.1 C) 98.2 F (36.8 C) 98.5 F (36.9 C)  TempSrc: Oral  Oral Oral  Resp: 18 20 18 16   Height:      Weight:    89.9 kg (198 lb 3.1 oz)  SpO2: 96% 99% 94% 98%    Intake/Output from previous day:   Intake/Output Summary (Last 24 hours) at 01/15/11 1334 Last data filed at 01/15/11 0936  Gross per 24 hour  Intake   1143 ml  Output    575 ml  Net    568 ml    Physical Exam:  General: Middle-aged male lying in bed in no acute distress. HEENT: no pallor, no icterus, moist oral mucosa, no JVD, no lymphadenopathy Heart: Normal  s1 &s2  Regular rate and rhythm, without murmurs, rubs, gallops. Lungs: Clear to auscultation bilaterally. Abdomen: Soft, nontender, nondistended, positive bowel sounds. Extremities: No clubbing cyanosis or edema  Neuro: Alert, awake, oriented x3, nonfocal.   Lab Results:  Basic Metabolic Panel:    Component Value Date/Time   NA 131* 01/15/2011 0720   K 4.4 01/15/2011 0720   CL 97 01/15/2011 0720   CO2 25 01/15/2011 0720   BUN 17 01/15/2011 0720   CREATININE 1.32 01/15/2011 0720   GLUCOSE 246* 01/15/2011 0720   CALCIUM 9.3 01/15/2011 0720   CBC:    Component Value Date/Time   WBC 6.0 01/14/2011 0635   HGB 15.7 01/14/2011 0635   HCT 43.2 01/14/2011 0635   PLT 342 01/14/2011 0635   MCV 92.5 01/14/2011 0635   NEUTROABS 5.8 01/08/2011 1832   LYMPHSABS 2.7 01/08/2011 1832   MONOABS 0.7 01/08/2011 1832   EOSABS 0.2 01/08/2011 1832   BASOSABS 0.0 01/08/2011 1832    No results found for this or any previous visit (from the past 240 hour(s)).  Studies/Results: No results  found.  Medications: Scheduled Meds:   . enoxaparin (LOVENOX) injection  90 mg Subcutaneous Q12H  . fluticasone  2 spray Each Nare Daily  . levofloxacin  750 mg Oral Daily  . loratadine  10 mg Oral Daily  . sodium chloride  3 mL Intravenous Q12H  . Tamsulosin HCl  0.4 mg Oral QHS  . warfarin  15 mg Oral ONCE-1800  . warfarin  15 mg Oral ONCE-1800   Continuous Infusions:  PRN Meds:.acetaminophen, bisacodyl, docusate sodium, guaiFENesin-dextromethorphan, HYDROmorphone (DILAUDID) injection, ondansetron, ondansetron, oxyCODONE, senna, traZODone  Assessment  Mr. [name] is a 45 year old African American male with history of hypertension and stage II kidney disease who presented with pain and swelling in his left leg with worsening dyspnea. He was diagnosed to have left lower extremity deep vein thrombosis and acute bilateral pulmonary embolism. Given his significant symptoms of dyspnea he has been started on subcutaneous Lovenox to bridge with by mouth Coumadin while in the hospital. An unrelated   Plan #1 acute bilateral pulmonary embolism Secondary to lower extremity DVT. Patient is hemodynamically stable at this time He has been started on subcutaneous Lovenox to bridge for her oral Coumadin. His INR is improving progressively. INR today is 1.96. Continue subcutaneous Lovenox for now and if INR becomes  therapeutic tomorrow we will monitor her for one more day and he can be discharged in the next day on oral Coumadin.  #2 acute sinusitis Claudie Fisherman has completed a 5 day course of by mouth Levaquin and he's asymptomatic at this time  #3 chronic kidney disease stage II: Stable  #4 elevated blood glucose: His blood glucose this morning was noted to be 246. He does not have a history of underlying diabetes. I would recheck his fingerstick monitoring. #5 hypertension stable  LOS: 7 days   Julita Ozbun 01/15/2011, 1:34 PM

## 2011-01-15 NOTE — Progress Notes (Signed)
ANTICOAGULATION CONSULT NOTE - Follow Up Consult  Pharmacy Consult for Lovenox and Coumadin Indication: pulmonary embolus and DVT  No Known Allergies  Patient Measurements: Height: 5' 7.5" (171.5 cm) (Entered for Cutover) Weight: 198 lb 3.1 oz (89.9 kg) (scale a) IBW/kg (Calculated) : 67.25  Adjusted Body Weight:   Vital Signs: Temp: 98.5 F (36.9 C) (11/07 0529) Temp src: Oral (11/07 0529) BP: 116/78 mmHg (11/07 0529) Pulse Rate: 85  (11/07 0529)  Labs:  Basename 01/15/11 0720 01/14/11 0635 01/13/11 0605  HGB -- 15.7 --  HCT -- 43.2 --  PLT -- 342 --  APTT -- -- --  LABPROT 22.7* 20.2* 17.7*  INR 1.96* 1.69* 1.43  HEPARINUNFRC -- -- --  CREATININE 1.32 -- --  CKTOTAL -- -- --  CKMB -- -- --  TROPONINI -- -- --   Estimated Creatinine Clearance: 76.3 ml/min (by C-G formula based on Cr of 1.32).   Medications:  See electronic MAR Lovenox 90mg  SQ q12h  Assessment: 44yom on Lovenox bridging to Coumadin (Day 8) for bilateral PE and DVT treatment. INR (1.96) is just below goal but continues to trend up appropriately with Coumadin 15mg  doses. Pt's renal function has remain stable  Goal of Therapy:  INR 2-3 Anti-Xa level 06-1.2  Plan:  1. Coumadin 15mg  po x1 2. Continue Lovenox 90mg  SQ Q12h. Will most likely be able to discontinue Lovenox tomorrow if INR is therapeutic  Cleon Dew [phone] 01/15/2011,9:59 AM

## 2011-01-16 LAB — PROTIME-INR: Prothrombin Time: 23.6 seconds — ABNORMAL HIGH (ref 11.6–15.2)

## 2011-01-16 MED ORDER — LORATADINE 10 MG PO TABS: 10.0000 mg | ORAL_TABLET | Freq: Every day | ORAL | Status: DC

## 2011-01-16 MED ORDER — OXYCODONE HCL 5 MG PO TABS: 5.0000 mg | ORAL_TABLET | Freq: Four times a day (QID) | ORAL | Status: AC | PRN

## 2011-01-16 MED ORDER — COUMADIN 10 MG PO TABS: 10.0000 mg | ORAL_TABLET | Freq: Every day | ORAL | Status: DC

## 2011-01-16 NOTE — Plan of Care (Signed)
Problem: Phase I Progression Outcomes Goal: Discharge plan established Outcome: Completed/Met Date Met:  01/16/11 Will discharge home when INR is therapeutic and observation is complete.

## 2011-01-16 NOTE — Discharge Summary (Addendum)
Patient ID: [name] MRN: [id number] DOB/AGE: 46-May-1967 46 y.o.  Admit date: 01/08/2011 Discharge date: 01/16/2011  Primary Care Physician:  Dr Concha Pyo at [facility]   Principal discharge diagnosis:  . Acute bilateral pulmonary embolism  . acute deep vein thrombosis of left lower extremity . New onset diabetes mellitus  Secondary discharge diagnosis Acute sinusitis CKD stage II BPH   Current Discharge Medication List    CONTINUE these medications which have NOT CHANGED   Details  fish oil-omega-3 fatty acids 1000 MG capsule Take 2 g by mouth 2 (two) times daily.      naproxen (NAPROSYN) 500 MG tablet Take 500 mg by mouth 2 (two) times daily with a meal.      Tamsulosin HCl (FLOMAX) 0.4 MG CAPS Take 0.4 mg by mouth daily.    Oxycodone 5 mg                                     Take 1 tablet every 6hrs as needed for pain, total 12 tablets  Tab warfarin 10 mg                                     Take 1 tab daily in the evening     Disposition and Follow-up:  Discharge home with a followup with Dr. Langston Masker in the Coumadin clinic in [place] at 9:30 AM 1 in 11/9  Consults: None  Significant Diagnostic Studies:  Ct Angio Chest W/cm &/or Wo Cm  01/08/2011  *RADIOLOGY REPORT*  Clinical Data:  Left leg DVT.  Chest pain and shortness of breath.  CT ANGIOGRAPHY CHEST WITH CONTRAST  Technique:  Multidetector CT imaging of the chest was performed using the standard protocol during bolus administration of intravenous contrast.  Multiplanar CT image reconstructions including MIPs were obtained to evaluate the vascular anatomy.  Contrast: 80mL OMNIPAQUE IOHEXOL 350 MG/ML IV SOLN  Comparison:  Chest radiography 10/13  Findings:  The study is positive for multiple bilateral pulmonary emboli, most pronounced in the lower lobe arterial tree right more than left.  The lungs remain clear.  No collapse, effusion or infarction.  No pericardial fluid.  The aorta appears  unremarkable. No hilar mediastinal mass.  Upper abdominal structures appear normal as seen.  Review of the MIP images confirms the above findings.  IMPRESSION: The study is positive for multiple bilateral pulmonary emboli, particularly in the lower lobe pulmonary arterial tree and right more than left.Critical Value/emergent results were called by telephone at the time of interpretation on 01/08/2011  at 2140 hours  to  [facility] EDP, who verbally acknowledged these results.  Original Report Authenticated By: Thomasenia Sales, M.D.   Venous Doppler off lower extremity DVT of left lower extremity  Brief H and P: For complete details please refer to admission H and P, but in brief patient is a 45 year old [place] American male with a history of hypertension and depression not on medications, who had an upper respiratory infection about 8 or 9 days back with persistent cough and shortness of breath 2 days later he also noticed pain and swelling in his left leg up to the calf. He spoke with his primary care physician who suggested him and took go to the emergency room. Patient had a Doppler of the lower extremity done in the emergency  room which confirmed for DVT off and left lower extremity. Since he was persistently short of breath he also had a CT angiogram of the chest which revealed multiple bilateral pulmonary emboli. Given his a significant symptom of dyspnea he was started on subcutaneous Lovenox to bridge with her by mouth Coumadin 1 and a hospital.  Physical Exam on Discharge:  Filed Vitals:   01/15/11 0529 01/15/11 1358 01/15/11 2114 01/16/11 0357  BP: 116/78 127/76 118/77 129/72  Pulse: 85 88 87 76  Temp: 98.5 F (36.9 C) 98.9 F (37.2 C) 98.6 F (37 C) 97.8 F (36.6 C)  TempSrc: Oral Oral    Resp: 16 20 18 18   Height:      Weight: 89.9 kg (198 lb 3.1 oz)   90.4 kg (199 lb 4.7 oz)  SpO2: 98% 95% 96% 95%     Intake/Output Summary (Last 24 hours) at 01/16/11 1006 Last data filed at  01/16/11 0957  Gross per 24 hour  Intake   1086 ml  Output   1000 ml  Net     86 ml    General: Middle aged male  in no acute distress. HEENT: no pallor , no icterus Heart: Regular rate and rhythm, without murmurs, rubs, gallops. Lungs: Clear to auscultation bilaterally. Abdomen: Soft, nontender, nondistended, positive bowel sounds. Extremities: No clubbing cyanosis or edema with positive pedal pulses. Neuro: AAOx3, non focal  CBC:    Component Value Date/Time   WBC 6.0 01/14/2011 0635   HGB 15.7 01/14/2011 0635   HCT 43.2 01/14/2011 0635   PLT 342 01/14/2011 0635   MCV 92.5 01/14/2011 0635   NEUTROABS 5.8 01/08/2011 1832   LYMPHSABS 2.7 01/08/2011 1832   MONOABS 0.7 01/08/2011 1832   EOSABS 0.2 01/08/2011 1832   BASOSABS 0.0 01/08/2011 1832    Basic Metabolic Panel:    Component Value Date/Time   NA 131* 01/15/2011 0720   K 4.4 01/15/2011 0720   CL 97 01/15/2011 0720   CO2 25 01/15/2011 0720   BUN 17 01/15/2011 0720   CREATININE 1.32 01/15/2011 0720   GLUCOSE 246* 01/15/2011 0720   CALCIUM 9.3 01/15/2011 0720    Hospital Course:  Acute bilateral pulmonary embolism  Secondary to left lower extremity DVT. Patient was started on subcutaneous Lovenox with plan on inpatient management given his significant shortness of breath.  He has been started on oral Coumadin. His INR is improving progressively. INR today is 2.06. His symptoms all for shortness of breath has resolved and lower extremity pain over the left calf has also significantly improved. He will be discharged home on her oral warfarin 10 mg daily and has a scheduled followup in the Coumadin clinic in [facility] tomorrow morning at 9:30 AM with Dr. Langston Masker   acute sinusitis   has completed a 5 day course of by mouth Levaquin and he's asymptomatic at this time    chronic kidney disease stage II: Stable   elevated blood glucose: His blood glucose on 11/7 was noted to be 246. He does not have a history of underlying  diabetes.  rechecked his fingerstick this morning and it was 243. It is likely that he has a new onset diabetes mellitus. He agrees to follow up with his primary care doctor regarding this.   Patient clinically stable to be discharged home with outpatient followup. A copy of the discharge summary has been provided to the patient.   Time spent on Discharge: 45 minutes  Signed: Keashia Haskins 01/16/2011, 10:06 AM  Patient had hypercoaguable work up sent on admission.  his antithrombin III level was wnl Lupus AC was not detected Homocysteine level was wnl ( 11.3) Protein C was elevated ( 162%) Protein S was high normal ( 129%)  Discussed hospital course and follow up with PCP who had called after aptient discharged. D/c summary to be sent to PCP

## 2011-01-16 NOTE — Progress Notes (Signed)
PT DC'D TO HOME WITH SELF CARE.  PT HAS OP APPT TOMORROW [facility] WITH DR. Baxter Hire MORRIS AT 0930 FOR INITIAL PLAN OF CARE TEST. Willa Rough 01/16/2011 [phone] OR [phone]

## 2011-04-01 ENCOUNTER — Encounter (HOSPITAL_COMMUNITY): Payer: Self-pay | Admitting: Emergency Medicine

## 2011-04-01 ENCOUNTER — Emergency Department (HOSPITAL_COMMUNITY)
Admission: EM | Admit: 2011-04-01 | Discharge: 2011-04-01 | Disposition: A | Discharge status: Home / Self Care | Payer: Non-veteran care | Attending: Emergency Medicine | Admitting: Emergency Medicine

## 2011-04-01 DIAGNOSIS — I10 Essential (primary) hypertension: Secondary | ICD-10-CM | POA: Insufficient documentation

## 2011-04-01 DIAGNOSIS — Z86718 Personal history of other venous thrombosis and embolism: Secondary | ICD-10-CM | POA: Insufficient documentation

## 2011-04-01 DIAGNOSIS — R51 Headache: Secondary | ICD-10-CM | POA: Insufficient documentation

## 2011-04-01 DIAGNOSIS — Z7901 Long term (current) use of anticoagulants: Secondary | ICD-10-CM | POA: Insufficient documentation

## 2011-04-01 DIAGNOSIS — Z79899 Other long term (current) drug therapy: Secondary | ICD-10-CM | POA: Insufficient documentation

## 2011-04-01 DIAGNOSIS — M25519 Pain in unspecified shoulder: Secondary | ICD-10-CM | POA: Insufficient documentation

## 2011-04-01 DIAGNOSIS — M542 Cervicalgia: Secondary | ICD-10-CM | POA: Insufficient documentation

## 2011-04-01 MED ORDER — TRAMADOL HCL 50 MG PO TABS: 50.0000 mg | ORAL_TABLET | Freq: Four times a day (QID) | ORAL | Status: AC | PRN

## 2011-04-01 MED ORDER — CYCLOBENZAPRINE HCL 10 MG PO TABS: 10.0000 mg | ORAL_TABLET | Freq: Two times a day (BID) | ORAL | Status: AC | PRN
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
## 2011-04-03 NOTE — ED Provider Notes (Signed)
Medical screening examination/treatment/procedure(s) were performed by non-physician practitioner and as supervising physician I was immediately available for consultation/collaboration.  Loren Racer, MD 04/03/11 [phone]

## 2011-07-22 ENCOUNTER — Inpatient Hospital Stay (HOSPITAL_COMMUNITY)
Admission: EM | Admit: 2011-07-22 | Discharge: 2011-07-25 | DRG: 301 | Disposition: A | Discharge status: Home / Self Care | Payer: Non-veteran care | Source: Ambulatory Visit | Attending: Internal Medicine | Admitting: Internal Medicine

## 2011-07-22 ENCOUNTER — Encounter (HOSPITAL_COMMUNITY): Payer: Self-pay | Admitting: Emergency Medicine

## 2011-07-22 ENCOUNTER — Emergency Department (INDEPENDENT_AMBULATORY_CARE_PROVIDER_SITE_OTHER)
Admission: EM | Admit: 2011-07-22 | Discharge: 2011-07-22 | Disposition: A | Discharge status: Nursing Home (Includes ICF) | Payer: Non-veteran care | Source: Home / Self Care | Attending: Emergency Medicine | Admitting: Emergency Medicine

## 2011-07-22 ENCOUNTER — Encounter (HOSPITAL_COMMUNITY): Payer: Self-pay

## 2011-07-22 ENCOUNTER — Emergency Department (INDEPENDENT_AMBULATORY_CARE_PROVIDER_SITE_OTHER): Payer: Non-veteran care

## 2011-07-22 DIAGNOSIS — M79646 Pain in unspecified finger(s): Secondary | ICD-10-CM

## 2011-07-22 DIAGNOSIS — Z91199 Patient's noncompliance with other medical treatment and regimen due to unspecified reason: Secondary | ICD-10-CM

## 2011-07-22 DIAGNOSIS — N139 Obstructive and reflux uropathy, unspecified: Secondary | ICD-10-CM | POA: Diagnosis present

## 2011-07-22 DIAGNOSIS — M25531 Pain in right wrist: Secondary | ICD-10-CM | POA: Diagnosis present

## 2011-07-22 DIAGNOSIS — I80299 Phlebitis and thrombophlebitis of other deep vessels of unspecified lower extremity: Secondary | ICD-10-CM

## 2011-07-22 DIAGNOSIS — N400 Enlarged prostate without lower urinary tract symptoms: Secondary | ICD-10-CM | POA: Diagnosis present

## 2011-07-22 DIAGNOSIS — N401 Enlarged prostate with lower urinary tract symptoms: Secondary | ICD-10-CM

## 2011-07-22 DIAGNOSIS — Z86718 Personal history of other venous thrombosis and embolism: Secondary | ICD-10-CM

## 2011-07-22 DIAGNOSIS — Z9114 Patient's other noncompliance with medication regimen: Secondary | ICD-10-CM

## 2011-07-22 DIAGNOSIS — R0989 Other specified symptoms and signs involving the circulatory and respiratory systems: Secondary | ICD-10-CM

## 2011-07-22 DIAGNOSIS — M25539 Pain in unspecified wrist: Secondary | ICD-10-CM

## 2011-07-22 DIAGNOSIS — Z9119 Patient's noncompliance with other medical treatment and regimen: Secondary | ICD-10-CM

## 2011-07-22 DIAGNOSIS — I749 Embolism and thrombosis of unspecified artery: Secondary | ICD-10-CM | POA: Diagnosis present

## 2011-07-22 DIAGNOSIS — I742 Embolism and thrombosis of arteries of the upper extremities: Secondary | ICD-10-CM

## 2011-07-22 DIAGNOSIS — I10 Essential (primary) hypertension: Secondary | ICD-10-CM | POA: Diagnosis present

## 2011-07-22 DIAGNOSIS — Z87891 Personal history of nicotine dependence: Secondary | ICD-10-CM

## 2011-07-22 DIAGNOSIS — M79609 Pain in unspecified limb: Secondary | ICD-10-CM

## 2011-07-22 DIAGNOSIS — Z86711 Personal history of pulmonary embolism: Secondary | ICD-10-CM

## 2011-07-22 DIAGNOSIS — I7779 Dissection of other artery: Secondary | ICD-10-CM | POA: Diagnosis present

## 2011-07-22 DIAGNOSIS — N138 Other obstructive and reflux uropathy: Secondary | ICD-10-CM | POA: Diagnosis present

## 2011-07-22 HISTORY — DX: Patient's other noncompliance with medication regimen: Z91.14

## 2011-07-22 HISTORY — DX: Embolism and thrombosis of arteries of the upper extremities: I74.2

## 2011-07-22 HISTORY — DX: Patient's other noncompliance with medication regimen for other reason: Z91.148

## 2011-07-22 HISTORY — DX: Long term (current) use of anticoagulants: Z79.01

## 2011-07-22 HISTORY — DX: Benign prostatic hyperplasia without lower urinary tract symptoms: N40.0

## 2011-07-22 LAB — DIFFERENTIAL
Eosinophils Relative: 1 % (ref 0–5)
Lymphocytes Relative: 36 % (ref 12–46)
Lymphs Abs: 2.3 10*3/uL (ref 0.7–4.0)
Monocytes Absolute: 0.6 10*3/uL (ref 0.1–1.0)

## 2011-07-22 LAB — CBC
HCT: 43.2 % (ref 39.0–52.0)
MCV: 94.3 fL (ref 78.0–100.0)
RDW: 13.4 % (ref 11.5–15.5)
WBC: 6.5 10*3/uL (ref 4.0–10.5)

## 2011-07-22 LAB — URIC ACID: Uric Acid, Serum: 6.9 mg/dL (ref 4.0–7.8)

## 2011-07-22 MED ORDER — HEPARIN (PORCINE) IN NACL 100-0.45 UNIT/ML-% IJ SOLN
1100.0000 [IU]/h | INTRAMUSCULAR | Status: DC
  Administered 2011-07-22: 1150 [IU]/h via INTRAVENOUS
  Administered 2011-07-23: 1100 [IU]/h via INTRAVENOUS
  Filled 2011-07-22 (×4): qty 250

## 2011-07-22 MED ORDER — SODIUM CHLORIDE 0.9 % IV SOLN: 250.0000 mL | INTRAVENOUS | Status: DC | PRN

## 2011-07-22 MED ORDER — SODIUM CHLORIDE 0.9 % IJ SOLN: 3.0000 mL | INTRAMUSCULAR | Status: DC | PRN

## 2011-07-22 MED ORDER — HEPARIN BOLUS VIA INFUSION
5000.0000 [IU] | Freq: Once | INTRAVENOUS | Status: AC
  Administered 2011-07-22: 5000 [IU] via INTRAVENOUS

## 2011-07-22 MED ORDER — ACETAMINOPHEN 650 MG RE SUPP: 650.0000 mg | Freq: Four times a day (QID) | RECTAL | Status: DC | PRN

## 2011-07-22 MED ORDER — SODIUM CHLORIDE 0.9 % IJ SOLN
3.0000 mL | Freq: Two times a day (BID) | INTRAMUSCULAR | Status: DC
  Administered 2011-07-22 – 2011-07-23 (×2): 3 mL via INTRAVENOUS

## 2011-07-22 MED ORDER — ONDANSETRON HCL 4 MG/2ML IJ SOLN: 4.0000 mg | Freq: Four times a day (QID) | INTRAMUSCULAR | Status: DC | PRN

## 2011-07-22 MED ORDER — SENNOSIDES-DOCUSATE SODIUM 8.6-50 MG PO TABS: 1.0000 | ORAL_TABLET | Freq: Every evening | ORAL | Status: DC | PRN

## 2011-07-22 MED ORDER — ONDANSETRON HCL 4 MG PO TABS: 4.0000 mg | ORAL_TABLET | Freq: Four times a day (QID) | ORAL | Status: DC | PRN

## 2011-07-22 MED ORDER — MORPHINE SULFATE 2 MG/ML IJ SOLN: 1.0000 mg | INTRAMUSCULAR | Status: DC | PRN

## 2011-07-22 MED ORDER — HEPARIN SODIUM (PORCINE) 5000 UNIT/ML IJ SOLN
INTRAMUSCULAR | Status: AC
  Filled 2011-07-22: qty 1

## 2011-07-22 MED ORDER — ACETAMINOPHEN 325 MG PO TABS: 650.0000 mg | ORAL_TABLET | Freq: Four times a day (QID) | ORAL | Status: DC | PRN

## 2011-07-22 MED ORDER — HYDROCODONE-ACETAMINOPHEN 5-325 MG PO TABS
1.0000 | ORAL_TABLET | ORAL | Status: DC | PRN
  Administered 2011-07-22 – 2011-07-25 (×8): 2 via ORAL
  Filled 2011-07-22 (×8): qty 2

## 2011-07-22 NOTE — ED Notes (Signed)
Attempted to call report nurse unavailable at this time will call back

## 2011-07-22 NOTE — ED Notes (Signed)
Vascular surgeons in to assess

## 2011-07-22 NOTE — ED Notes (Signed)
PT HERE WITH SUDDEN RIGHT PALM BELOW THUMB WITH SWELLING AND DISCOLORATION THAT FLARED UP X 3 DYS AGO.PT UNAWARE OF REASON.SHARP CONSTANT PAIN WITH GRASP OR MOVEMENT.NAILBED DISCOLORED AND SWOLLEN.PAIN RADIATING TO WRIST

## 2011-07-22 NOTE — Consult Note (Addendum)
[facility] CONSULT NOTE 07/22/2011 DOB: [id number] MRN : [id number]  CC: right thumb pain/discoloration  History of Present Illness: [name] is a 46 y.o. male with Hx of DVT in past who has been on Xarelto who presents with right thumb pain.  Pt states he has not taken Xarelto over the past 3 days. Last PM He noted increasing pain and discoloration in his right thumb from wrist area at base of thumb to the fingertip. He also has had decreased sensation in the thumb and wrist. His grip is normal. He denies pain or numbness in other fingers of either hand. He is right hand dominant.  He denies any recent trauma to right arm or any smoking.  He came to ER for evaluation.  Past Medical History  Diagnosis Date  . DVT (deep venous thrombosis) 01/08/2011    lt leg  . Hypertension   . DVT of lower limb, acute 01/12/2011   History reviewed. No pertinent past surgical history.  Social History History  Substance Use Topics  . Smoking status: Former Smoker -- 6 years    Types: Cigars    Quit date: 09/14/2010  . Smokeless tobacco: Never Used  . Alcohol Use: 1.2 oz/week    2 Cans of beer per week    Family History History reviewed. No pertinent family history.  No Known Allergies  No current facility-administered medications for this encounter.   Current Outpatient Prescriptions  Medication Sig Dispense Refill  . acetaminophen (TYLENOL ARTHRITIS PAIN) 650 MG CR tablet Take 1,300 mg by mouth every 8 (eight) hours as needed. For arthritis pain      . Rivaroxaban (XARELTO) 20 MG TABS Take 20 mg by mouth daily.       ROS: [x]  Positive  [ ]  Denies    General: [ ]  Weight loss, [ ]  Fever, [ ]  chills Neurologic: [ ]  Dizziness, [ ]  Blackouts, [ ]  Seizure [ ]  Stroke, [ ]  "Mini stroke", [ ]  Slurred speech, [ ]  Temporary blindness; [ ]  weakness in arms or legs, [ ]  Hoarseness Cardiac: [ ]  Chest pain/pressure, [ ]  Shortness of breath at rest [ ]  Shortness of breath  with exertion, [ ]  Atrial fibrillation or irregular heartbeat Vascular: [ ]  Pain in legs with walking, [ ]  Pain in legs at rest, [ ]  Pain in legs at night,  [ ]  Non-healing ulcer, [x ] Blood clot in vein/DVT,   Pulmonary: [ ]  Home oxygen, [ ]  Productive cough, [ ]  Coughing up blood, [ ]  Asthma,  [ ]  Wheezing Musculoskeletal:  [ ]  Arthritis, [ ]  Low back pain, [ ]  Joint pain Hematologic: [ ]  Easy Bruising, [ ]  Anemia; [ ]  Hepatitis Gastrointestinal: [ ]  Blood in stool, [ ]  Gastroesophageal Reflux/heartburn, [ ]  Trouble swallowing Urinary: [ ]  chronic Kidney disease, [ ]  on HD - [ ]  MWF or [ ]  TTHS, [ ]  Burning with urination, [ ]  Difficulty urinating Skin: [ ]  Rashes, [ ]  Wounds Psychological: [ ]  Anxiety, [ ]  Depression  Physical Examination  Patient Vitals for the past 24 hrs:  BP Temp Temp src Pulse Resp SpO2  07/22/11 1414 149/94 mmHg 97.9 F (36.6 C) Oral 58  22  95 %   Pulse Readings from Last 3 Encounters:  07/22/11 58  04/01/11 70  01/16/11 76    General:  WDWN in NAD Gait: Normal HENT: WNL Eyes: Pupils equal Pulmonary: normal non-labored breathing , without Rales, rhonchi,  wheezing Cardiac:  RRR, without  Murmurs, rubs or gallops; No carotid bruits Abdomen: soft, NT, no masses Skin: no rashes, ulcers noted Vascular Exam/Pulses:  Right thumb warm, painful/pale distal phalanx to tip,  RUE Prob venous flow at radial -non palppable Biphasic Doppler ulnar  Biphasic flow in palmar arch  Palpable brachial pulses bilat Right biphasic doppler flow  LUE 3+ Palpable radial pulse Palp ulnar pulse BLE 3+ PT pulses  Both hands are warm and grip is 5/5 bilat. Extremities with ischemic changes in right thumb - discoloration, no Gangrene , no cellulitis; no open wounds;  Musculoskeletal: no muscle wasting or atrophy  Neurologic: A&O X 3; Appropriate Affect ;  SENSATION: normal; MOTOR FUNCTION:  moving all extremities equally.  Speech is fluent/normal Psychiatric:  judgement intact, mood and affect appropriate Lymph: no LAD in all basins    Imaging: Dg Wrist Complete Right  07/22/2011  *RADIOLOGY REPORT*  Clinical Data: Right wrist pain and swelling, no injury  RIGHT WRIST - COMPLETE 3+ VIEW  Comparison: None  Findings: Bone mineralization normal. Joint spaces preserved. No fracture, dislocation, or bone destruction.  IMPRESSION: No acute abnormalities.  Original Report Authenticated By: Lollie Marrow, M.D.    Significant Diagnostic Studies: CBC Lab Results Component Value Date  WBC 6.5 07/22/2011  HGB 15.2 07/22/2011  HCT 43.2 07/22/2011  MCV 94.3 07/22/2011  PLT 262 07/22/2011   BMET   Component Value Date/Time  NA 131* 01/15/2011 0720  K 4.4 01/15/2011 0720  CL 97 01/15/2011 0720  CO2 25 01/15/2011 0720  GLUCOSE 246* 01/15/2011 0720  BUN 17 01/15/2011 0720  CREATININE 1.32 01/15/2011 0720  CALCIUM 9.3 01/15/2011 0720  GFRNONAA 64* 01/15/2011 0720  GFRAA 74* 01/15/2011 0720   COAG Lab Results Component Value Date  INR 1.36 07/22/2011  INR 2.06* 01/16/2011  INR 1.96* 01/15/2011  No results found for this basename: PTT    Non-Invasive Vascular Imaging:duplex pending  ASSESSMENT: 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 May need further hypercoag W/U in this pt with HX of DVT. Also may need 2D echo. Maintain NPO  Addendum  I have independently interviewed and examined the patient, and I agree with the physician assistant's findings.  I reviewed the preliminary findings directly with the vascular technician: tapering of the radial artery off the take off from the brachial artery.  No evidence of thrombus.  Intact ulnar flow with trickle of radial artery flow.  On exam, he has a viable hand with intact palmar flow via the ulnar artery.  I repeated the ultrasound exam with a Sonosite at bedside.  I found findings consist with the  radial arterial tapering and also likely dissection flap.  The distal radial artery has some findings possibly consistent with thrombus.  Based on these findings, I suspect this patient has:  1. Isolated right radial artery dissection 2. 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 Non-surgical management: no advantage to surgical thrombectomy as the dissection flap can be accidentally propagated more proximally with a retrograde thrombectomy. 2. Most dissections can managed non-operatively with anticoagulation. 3. Admit to Internal Medicine service for Heparin drip, thrombophilia work-up, and transition back to an anticoagulation regimen dependent on thrombophilia work-up 4. Right arm angiogram tomorrow to further evaluate the arterial supply of this right arm 5. I doubt this patient will need any revascularization surgery for this right hand.  Additionally, any long-term patency of such a reconstruction would be limited in the setting of poor compliance with anticoagulation given the small size of the radial artery.  Leonides Sake, MD [facility] Office: [phone] Pager: [phone]  07/22/2011, 4:52 PM

## 2011-07-22 NOTE — ED Notes (Signed)
Bag lunch and Ginger Ale given per pt request - awaiting inpt bed assignment - pt aware of same

## 2011-07-22 NOTE — ED Notes (Signed)
hospitalist in to assess  

## 2011-07-22 NOTE — ED Provider Notes (Signed)
History     CSN: [id number]  Arrival date & time 07/22/11  1332   None     Chief Complaint  Patient presents with  . Wrist Pain    (Consider location/radiation/quality/duration/timing/severity/associated sxs/prior treatment) HPI Comments: Worsening pain in R wrist for last few days.  Pain, coolness in right thumb today.  Otherwise doing well.  On riveroxiban for prior DVT, PE.  Has missed several doses including one 3 days ago.  Patient is a 46 y.o. male presenting with wrist pain. The history is provided by the patient.  Wrist Pain This is a new problem. Episode onset: 3 days. The problem occurs constantly. The problem has been gradually worsening. Pertinent negatives include no abdominal pain, arthralgias, chest pain, congestion, fatigue, fever, headaches or rash. The symptoms are aggravated by nothing. He has tried nothing for the symptoms.    Past Medical History  Diagnosis Date  . DVT (deep venous thrombosis) 01/08/2011    lt leg  . Hypertension   . DVT of lower limb, acute 01/12/2011    History reviewed. No pertinent past surgical history.  History reviewed. No pertinent family history.  History  Substance Use Topics  . Smoking status: Former Smoker -- 6 years    Types: Cigars    Quit date: 09/14/2010  . Smokeless tobacco: Never Used  . Alcohol Use: 1.2 oz/week    2 Cans of beer per week      Review of Systems  Constitutional: Negative for fever, activity change and fatigue.  HENT: Negative for congestion.   Eyes: Negative for pain.  Respiratory: Negative for chest tightness, shortness of breath, wheezing and stridor.   Cardiovascular: Negative for chest pain and leg swelling.  Gastrointestinal: Negative for abdominal pain.  Genitourinary: Negative for dysuria.  Musculoskeletal: Negative for arthralgias.  Skin: Negative for rash.  Neurological: Negative for headaches.  Psychiatric/Behavioral: Negative for behavioral problems.    Allergies  Review of  patient's allergies indicates no known allergies.  Home Medications   Current Outpatient Rx  Name Route Sig Dispense Refill  . ACETAMINOPHEN ER 650 MG PO TBCR Oral Take 1,300 mg by mouth every 8 (eight) hours as needed. For arthritis pain    . RIVAROXABAN 20 MG PO TABS Oral Take 20 mg by mouth daily.      BP 149/94  Pulse 58  Temp(Src) 97.9 F (36.6 C) (Oral)  Resp 22  SpO2 95%  Physical Exam  Constitutional: He is oriented to person, place, and time. He appears well-developed and well-nourished. No distress.  HENT:  Head: Normocephalic and atraumatic.  Eyes: Conjunctivae and EOM are normal. Pupils are equal, round, and reactive to light. No scleral icterus.  Neck: Normal range of motion. Neck supple.  Cardiovascular: Normal rate and regular rhythm.  Exam reveals no gallop and no friction rub.   No murmur heard. Pulmonary/Chest: Effort normal and breath sounds normal. No respiratory distress. He has no wheezes. He has no rales. He exhibits no tenderness.  Abdominal: Soft. He exhibits no distension and no mass. There is no tenderness. There is no rebound and no guarding.  Musculoskeletal: Normal range of motion. He exhibits no edema.       Cool right thumb with duskiness and delayed cap refill.  2+ radial pulse.  Neurological: He is alert and oriented to person, place, and time. He has normal reflexes. No cranial nerve deficit. He exhibits normal muscle tone. Coordination normal.  Skin: Skin is warm and dry. No rash noted. He is  not diaphoretic. No erythema.  Psychiatric: He has a normal mood and affect. His behavior is normal. Judgment and thought content normal.    ED Course  Procedures (including critical care time)  Labs Reviewed - No data to display Dg Wrist Complete Right  07/22/2011  *RADIOLOGY REPORT*  Clinical Data: Right wrist pain and swelling, no injury  RIGHT WRIST - COMPLETE 3+ VIEW  Comparison: None  Findings: Bone mineralization normal. Joint spaces preserved. No  fracture, dislocation, or bone destruction.  IMPRESSION: No acute abnormalities.  Original Report Authenticated By: Lollie Marrow, M.D.     1. Right radial artery thrombus       MDM  Worsening pain in R wrist for last few days.  Pain, coolness in right thumb today.  Otherwise doing well.  On riveroxiban for prior DVT, PE.  Has missed several doses including one 3 days ago.  Vascular - Dr. Imogene Burn has evaled.  Duplex shows radial arterial thrombus/dissection.  Recommended admission and continued anticoagulation.  Plan for am angio.  D/w hospitalist- will admit for further management.        Army Chaco, MD 07/22/11 1758

## 2011-07-22 NOTE — ED Notes (Signed)
Remains in vascular radiology

## 2011-07-22 NOTE — ED Notes (Signed)
Pt states he woke this am with right thumb with pain and discoloration, feels cold, pain into the wrist

## 2011-07-22 NOTE — ED Notes (Addendum)
In to obtain consent for angiogram; pt has questions; Dr.Neuner paged - states procedure will not be done today; therefore, consent should not be signed until all questions answered for pt; for this reason, consent not obtained at this time

## 2011-07-22 NOTE — ED Notes (Signed)
Ambulatory to vascular radiology

## 2011-07-22 NOTE — Progress Notes (Signed)
ANTICOAGULATION CONSULT NOTE - Initial Consult  Pharmacy Consult for Heparin Indication: Right radial artery thrombus  No Known Allergies  Patient Measurements:   Heparin Dosing Weight: 86kg  Vital Signs: Temp: 97.9 F (36.6 C) (05/14 1414) Temp src: Oral (05/14 1414) BP: 144/96 mmHg (05/14 1757) Pulse Rate: 63  (05/14 1757)  Labs:  Basename 07/22/11 1210  HGB 15.2  HCT 43.2  PLT 262  APTT 33  LABPROT 17.0*  INR 1.36  HEPARINUNFRC --  CREATININE --  CKTOTAL --  CKMB --  TROPONINI --    The CrCl is unknown because both a height and weight (above a minimum accepted value) are required for this calculation.   Medical History: Past Medical History  Diagnosis Date  . DVT (deep venous thrombosis) 01/08/2011    lt leg  . Hypertension   . DVT of lower limb, acute 01/12/2011  . BPH (benign prostatic hyperplasia)   . Current use of long term anticoagulation     Medications:   (Not in a hospital admission)  Assessment: 46 y/o male patient admitted with right radial artery thrombus requiring anticoagulation with heparin. Previously was receiving Xarelto but has been off for past 3 days, ok for full dose ufh gtt.  Goal of Therapy:  Heparin level 0.3-0.7 units/ml Monitor platelets by anticoagulation protocol: Yes   Plan:  Heparin 5000 unit IV bolus followed by infusion at 1150 units/hr. Check anti-Xa level in 6 hours and daily while on heparin Continue to monitor H&H and platelets  Verlene Mayer, PharmD, BCPS Pager [phone] 07/22/2011,6:41 PM

## 2011-07-22 NOTE — ED Notes (Signed)
Pt complains of pain in right thumb and wrist, thumb noted to be cool to touch, pale and now purplelish in color. Pt sts decreased sensation and painful. Denies injry

## 2011-07-22 NOTE — ED Provider Notes (Signed)
History     CSN: [id number]  Arrival date & time 07/22/11  1039   First MD Initiated Contact with Patient 07/22/11 1114      Chief Complaint  Patient presents with  . Hand Pain    (Consider location/radiation/quality/duration/timing/severity/associated sxs/prior treatment) HPI Comments: Been having pain in my right wrist near where my comments have noticed some swelling and discoloration of my thumb. It feels like my thumb is partially numb and it looks pale. His constantly hurting even without movement. I have not had any injuries or falls or anything that I can think of to have injured my right thumb or wrist. Pain shoots up towards my wrist and its worsening. Yesterday he felt much colder than the other thumb  No fevers.  Patient is a 46 y.o. male presenting with hand pain. The history is provided by the patient.  Hand Pain This is a new problem. The current episode started more than 2 days ago. The problem occurs constantly. The problem has been gradually worsening. Pertinent negatives include no chest pain, no abdominal pain and no shortness of breath. Exacerbated by: MOVEMENT. He has tried nothing for the symptoms. The treatment provided no relief.    Past Medical History  Diagnosis Date  . DVT (deep venous thrombosis) 01/08/2011    lt leg  . Hypertension   . DVT of lower limb, acute 01/12/2011    History reviewed. No pertinent past surgical history.  History reviewed. No pertinent family history.  History  Substance Use Topics  . Smoking status: Former Smoker -- 6 years    Types: Cigars    Quit date: 09/14/2010  . Smokeless tobacco: Never Used  . Alcohol Use: 1.2 oz/week    2 Cans of beer per week      Review of Systems  Constitutional: Negative for fever, chills, activity change and fatigue.  Respiratory: Negative for shortness of breath.   Cardiovascular: Negative for chest pain and leg swelling.  Gastrointestinal: Negative for abdominal pain.    Musculoskeletal: Negative for myalgias.  Skin: Positive for color change and pallor. Negative for rash and wound.    Allergies  Review of patient's allergies indicates no known allergies.  Home Medications   Current Outpatient Rx  Name Route Sig Dispense Refill  . RIVAROXABAN 15 MG PO TABS Oral Take 20 mg by mouth daily.    Marland Kitchen TAMSULOSIN HCL 0.4 MG PO CAPS Oral Take 0.4 mg by mouth daily.      . WARFARIN SODIUM 5 MG PO TABS Oral Take 10-15 mg by mouth daily. 2 tablets on Mon Wed and Friday, 3 tablets all other days      There were no vitals taken for this visit.  Physical Exam  Nursing note and vitals reviewed. Constitutional: He appears well-developed and well-nourished.  Musculoskeletal: He exhibits tenderness. He exhibits no edema.       Hands: Neurological: He is alert. He exhibits normal muscle tone. Coordination normal.  Skin: No rash noted. No erythema. There is pallor.    ED Course  Procedures (including critical care time)  Labs Reviewed  PROTIME-INR - Abnormal; Notable for the following:    Prothrombin Time 17.0 (*)    All other components within normal limits  CBC  DIFFERENTIAL  APTT  URIC ACID   Dg Wrist Complete Right  07/22/2011  *RADIOLOGY REPORT*  Clinical Data: Right wrist pain and swelling, no injury  RIGHT WRIST - COMPLETE 3+ VIEW  Comparison: None  Findings: Bone mineralization normal.  Joint spaces preserved. No fracture, dislocation, or bone destruction.  IMPRESSION: No acute abnormalities.  Original Report Authenticated By: Lollie Marrow, M.D.     1. Thumb pain   2. Wrist pain       MDM  Patient with exam suggestive of a peripheral vascular deficiency on the radial and perfusion, 6 x-rays and pulmonary lab work at [facility] was inconclusive and within normal. Patient has a significant history of thromboembolic events and is reporting that he is currently taking anticoagulant medicine. Exam and symptoms were not suggestive of an infectious  tenosynovitis or tendinitis as patient has no discomfort pain on the dorsal aspect of his thumb. On exam is most remarkable for a decrease capillary refill and a barely palpable radial pulse when compare with the opposite side        Jimmie Molly, MD 07/22/11 1321

## 2011-07-22 NOTE — ED Provider Notes (Addendum)
I saw and evaluated the patient, reviewed the resident's note and I agree with the findings and plan.  Hx of dvt. On anticoagulant.   Wrist pain for 3 d.  Today got "duskiness" in right thumb.  Mild cyanosis.  crf delayed. Consulted Radio producer.  Will admit.  Cheri Guppy, MD 07/22/11 1836  Cheri Guppy, MD 07/22/11 2011

## 2011-07-22 NOTE — H&P (Addendum)
History and Physical Examination  Date: 07/22/2011  Patient name: [name] Medical record number: [id number] Date of birth: Jun 09, 1965 Age: 46 y.o. Gender: male  PCP: Dr. Fredrich Birks at [facility]  Chief Complaint:  Chief Complaint  Patient presents with  . Wrist Pain     History of Present Illness: [name] is an 46 y.o. male hypertension a history of a DVT and bilateral pulmonary emboli and October 2012 currently on Rivaroxaban after being on Coumadin for approximately 6 months presented to the urgent care Center today with reports of 3 days of having a painful right wrist.  The patient reports that the pain increased in intensity and the wrist pain woke him up in the middle of the night.  The patient reports that he will woke up early this morning with right wrist pain he noticed that his thumb began to turn a purple color.  The patient reports that he has been followed by hematologist Dr. Randa Evens at the [facility] clinic in [place].  The patient says that he's been on Rivaroxaban for approximately 3 weeks.  He reports that in the last week he has missed several doses of the medication.  The patient reports that he believes that he had a hypercoagulable workup by his hematologist earlier in the year.  The patient says that he was told that he was going to need to be anticoagulated for the rest of his life.  He is not able to recall that he was given a specific diagnosis of a hypercoagulable disease.  The patient was seen at the urgent care center and sent to the emergency department for evaluation.  In the emergency department he was evaluated with an arterial Doppler ultrasound of the right upper extremity that revealed thrombosis of the distal right radius.  He had diminished pulses.  He had significant pain and discoloration of the right thumb.  He was seen by the vascular surgeon who reported isolated radial artery dissection and that no acute surgical intervention was  required at this time.  He did recommend that the patient be started on a heparin infusion.  The patient is going to be scheduled for an arteriogram tomorrow morning.  A medical hospitalization was requested to facilitate full anticoagulation with heparin.  Past Medical History Past Medical History  Diagnosis Date  . DVT (deep venous thrombosis) 01/08/2011    lt leg  . Hypertension   . DVT of lower limb, acute 01/12/2011       BPH      Long term anticoagulation  Past Surgical History History reviewed. No pertinent past surgical history.  Home Meds: Prior to Admission medications   Medication Sig Start Date End Date Taking? Authorizing Provider  acetaminophen (TYLENOL ARTHRITIS PAIN) 650 MG CR tablet Take 1,300 mg by mouth every 8 (eight) hours as needed. For arthritis pain   Yes Historical Provider, MD  Rivaroxaban (XARELTO) 20 MG TABS Take 20 mg by mouth daily.   Yes Historical Provider, MD    Allergies: Review of patient's allergies indicates no known allergies.  Social History:  History   Social History  . Marital Status: Married    Spouse Name: N/A    Number of Children: N/A  . Years of Education: N/A   Occupational History  . Not on file.   Social History Main Topics  . Smoking status: Former Smoker -- 6 years    Types: Cigars    Quit date: 09/14/2010  . Smokeless tobacco: Never Used  .  Alcohol Use: 1.2 oz/week    2 Cans of beer per week  . Drug Use: No  . Sexually Active:    Other Topics Concern  . Not on file   Social History Narrative  . No narrative on file   Family History: History reviewed. No pertinent family history.  Review of Systems: Pertinent items are noted in HPI. All other systems reviewed and reported as negative.   Physical Exam: Blood pressure 144/96, pulse 63, temperature 97.9 F (36.6 C), temperature source Oral, resp. rate 17, SpO2 95.00%. General appearance: alert, cooperative and appears stated age Head: Normocephalic, without  obvious abnormality, atraumatic Eyes: negative Nose: Nares normal. Septum midline. Mucosa normal. No drainage or sinus tenderness., no discharge Neck: no adenopathy, no carotid bruit, no JVD, supple, symmetrical, trachea midline and thyroid not enlarged, symmetric, no tenderness/mass/nodules Lungs: clear to auscultation bilaterally and normal percussion bilaterally Chest wall: no tenderness Heart: regular rate and rhythm, S1, S2 normal, no murmur, click, rub or gallop Abdomen: soft, non-tender; bowel sounds normal; no masses,  no organomegaly Extremities: Weak radial pulse palpated right upper extremity, weak ulnar pulse palpated, discoloration with the purplish hue of the right thumb Skin: Purple discoloration of right thumb Neurologic: Alert and oriented X 3, normal strength and tone. Normal symmetric reflexes. Normal coordination and gait  Lab  And Imaging results:  Results for orders placed during the hospital encounter of 07/22/11 (from the past 24 hour(s))  CBC     Status: Normal   Collection Time   07/22/11 12:10 PM      Component Value Range   WBC 6.5  4.0 - 10.5 (K/uL)   RBC 4.58  4.22 - 5.81 (MIL/uL)   Hemoglobin 15.2  13.0 - 17.0 (g/dL)   HCT 78.2  95.6 - 21.3 (%)   MCV 94.3  78.0 - 100.0 (fL)   MCH 33.2  26.0 - 34.0 (pg)   MCHC 35.2  30.0 - 36.0 (g/dL)   RDW 08.6  57.8 - 46.9 (%)   Platelets 262  150 - 400 (K/uL)  DIFFERENTIAL     Status: Normal   Collection Time   07/22/11 12:10 PM      Component Value Range   Neutrophils Relative 55  43 - 77 (%)   Neutro Abs 3.6  1.7 - 7.7 (K/uL)   Lymphocytes Relative 36  12 - 46 (%)   Lymphs Abs 2.3  0.7 - 4.0 (K/uL)   Monocytes Relative 9  3 - 12 (%)   Monocytes Absolute 0.6  0.1 - 1.0 (K/uL)   Eosinophils Relative 1  0 - 5 (%)   Eosinophils Absolute 0.1  0.0 - 0.7 (K/uL)   Basophils Relative 0  0 - 1 (%)   Basophils Absolute 0.0  0.0 - 0.1 (K/uL)  PROTIME-INR     Status: Abnormal   Collection Time   07/22/11 12:10 PM       Component Value Range   Prothrombin Time 17.0 (*) 11.6 - 15.2 (seconds)   INR 1.36  0.00 - 1.49   APTT     Status: Normal   Collection Time   07/22/11 12:10 PM      Component Value Range   aPTT 33  24 - 37 (seconds)  URIC ACID     Status: Normal   Collection Time   07/22/11 12:10 PM      Component Value Range   Uric Acid, Serum 6.9  4.0 - 7.8 (mg/dL)     Impression   *  Thrombosis/embolism, 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  Consult pharmacy for management of heparin infusion.  The patient reports that he has a Theatre manager that is working with him for anticoagulation and that he has had a workup for hypercoagulable state which which she reports that he was told he would have to be fully anticoagulated for life.  The patient has missed several doses of Rivaroxaban which may have led to this thrombosis event.  I had a long discussion with the patient regarding this.  He prefers at this time the benefits of using Rivaroxaban for anticoagulation versus warfarin.  The patient says that the food and drug interactions associated with warfarin diminished his quality of life.  He reports that he had better quality of life using the Rivaroxaban.  Vascular surgery has been consult and following the patient.  He is scheduled for an arteriogram tomorrow.  Will make him n.p.o. after midnight.  Will provide medication for pain management as the patient does have significant pain.  Will monitor blood pressure.  Please see orders.  Follow hospital course and consultation notes and procedural notes.  Standley Dakins MD [facility] [place], [place] [phone] 07/22/2011, 6:26 PM

## 2011-07-22 NOTE — ED Notes (Signed)
Ambulatory from vascular radiology

## 2011-07-22 NOTE — ED Notes (Addendum)
Care assumed at this time; resting quietly in chair; presently c/o pain to rgt thumb; rgt thumb cool to touch, medial and lateral aspects of thumb pale, mottled appearing with ecchymosis noted to plantar aspect; area painful to touch; palpable rgt radial pulse noted at this time

## 2011-07-22 NOTE — ED Notes (Signed)
After pt. Transferred upstairs to Room 5525, E.D. Secretary asked me to check in ED room #10 for pt's cell phone and charger. The phone and charger were found, plugged into the wall. Both items were walked up by myself to Room #5525 and given to the pt.

## 2011-07-22 NOTE — ED Notes (Signed)
Report called to [place], RN on [phone]

## 2011-07-23 ENCOUNTER — Encounter (HOSPITAL_COMMUNITY)
Admission: EM | Disposition: A | Discharge status: Home / Self Care | Payer: Self-pay | Source: Ambulatory Visit | Attending: Internal Medicine

## 2011-07-23 DIAGNOSIS — M79609 Pain in unspecified limb: Secondary | ICD-10-CM

## 2011-07-23 DIAGNOSIS — Z86711 Personal history of pulmonary embolism: Secondary | ICD-10-CM

## 2011-07-23 DIAGNOSIS — N401 Enlarged prostate with lower urinary tract symptoms: Secondary | ICD-10-CM

## 2011-07-23 DIAGNOSIS — I742 Embolism and thrombosis of arteries of the upper extremities: Secondary | ICD-10-CM

## 2011-07-23 HISTORY — PX: BILATERAL UPPER EXTREMITY ANGIOGRAM: SHX5503

## 2011-07-23 LAB — CBC
HCT: 41 % (ref 39.0–52.0)
MCHC: 35.4 g/dL (ref 30.0–36.0)
RDW: 13.4 % (ref 11.5–15.5)
WBC: 7.2 10*3/uL (ref 4.0–10.5)

## 2011-07-23 LAB — POCT ACTIVATED CLOTTING TIME: Activated Clotting Time: 105 seconds

## 2011-07-23 LAB — BASIC METABOLIC PANEL
CO2: 25 mEq/L (ref 19–32)
Calcium: 8.5 mg/dL (ref 8.4–10.5)
Creatinine, Ser: 1.4 mg/dL — ABNORMAL HIGH (ref 0.50–1.35)
Glucose, Bld: 129 mg/dL — ABNORMAL HIGH (ref 70–99)

## 2011-07-23 LAB — HEPARIN LEVEL (UNFRACTIONATED)
Heparin Unfractionated: 0.75 IU/mL — ABNORMAL HIGH (ref 0.30–0.70)
Heparin Unfractionated: 0.56 IU/mL (ref 0.30–0.70)

## 2011-07-23 LAB — APTT: aPTT: 64 seconds — ABNORMAL HIGH (ref 24–37)

## 2011-07-23 SURGERY — BILATERAL UPPER EXTREMITY ANGIOGRAM
Anesthesia: LOCAL

## 2011-07-23 MED ORDER — FENTANYL CITRATE 0.05 MG/ML IJ SOLN
INTRAMUSCULAR | Status: AC
  Filled 2011-07-23: qty 2

## 2011-07-23 MED ORDER — MORPHINE SULFATE 4 MG/ML IJ SOLN
2.0000 mg | INTRAMUSCULAR | Status: DC | PRN
Start: 2011-07-23 — End: 2011-07-25

## 2011-07-23 MED ORDER — HEPARIN (PORCINE) IN NACL 2-0.9 UNIT/ML-% IJ SOLN
INTRAMUSCULAR | Status: AC
  Filled 2011-07-23: qty 1000

## 2011-07-23 MED ORDER — SODIUM CHLORIDE 0.9 % IV SOLN
1.0000 mL/kg/h | INTRAVENOUS | Status: DC
  Administered 2011-07-23 – 2011-07-24 (×4): 1 mL/kg/h via INTRAVENOUS

## 2011-07-23 MED ORDER — OXYCODONE-ACETAMINOPHEN 5-325 MG PO TABS
1.0000 | ORAL_TABLET | ORAL | Status: DC | PRN
  Administered 2011-07-23: 2 via ORAL

## 2011-07-23 MED ORDER — MIDAZOLAM HCL 2 MG/2ML IJ SOLN
INTRAMUSCULAR | Status: AC
  Filled 2011-07-23: qty 2

## 2011-07-23 MED ORDER — LIDOCAINE HCL (PF) 1 % IJ SOLN
INTRAMUSCULAR | Status: AC
  Filled 2011-07-23: qty 30

## 2011-07-23 MED ORDER — ONDANSETRON HCL 4 MG/2ML IJ SOLN: 4.0000 mg | Freq: Four times a day (QID) | INTRAMUSCULAR | Status: DC | PRN

## 2011-07-23 MED ORDER — OXYCODONE-ACETAMINOPHEN 5-325 MG PO TABS
ORAL_TABLET | ORAL | Status: AC
  Filled 2011-07-23: qty 2

## 2011-07-23 MED ORDER — ACETAMINOPHEN 325 MG PO TABS: 650.0000 mg | ORAL_TABLET | ORAL | Status: DC | PRN

## 2011-07-23 MED ORDER — ACETAMINOPHEN 325 MG PO TABS
650.0000 mg | ORAL_TABLET | ORAL | Status: DC | PRN
  Administered 2011-07-23: 650 mg via ORAL
  Filled 2011-07-23: qty 2

## 2011-07-23 NOTE — H&P (Signed)
  [facility] [place]  History and Physical Update  The patient was interviewed and re-examined.  The patient's previous History and Physical has been reviewed from and is unchanged from my consult on 07/22/11.  There is no change in the plan of care: R arm angiogram.  Leonides Sake, MD [facility] [place] Office: [phone] Pager: [phone]  07/23/2011, 11:00 AM

## 2011-07-23 NOTE — Op Note (Signed)
OPERATIVE NOTE   PROCEDURE: 1.  Right common femoral artery cannulation under ultrasound guidance 2.  Arch aortogram 3.  Second order arterial selection 3.  Right arm angiogram  PRE-OPERATIVE DIAGNOSIS: Right thumb ischemia  POST-OPERATIVE DIAGNOSIS: same as above   SURGEON: Leonides Sake, MD  ANESTHESIA: conscious sedation  ESTIMATED BLOOD LOSS: 30 cc  CONTRAST: 65 cc  FINDING(S):  Aortic arch: Type I, widely patent  Innominate artery, left common carotid artery, and left subclavian artery widely patent  Right common carotid artery widely patent  Proximal bilateral vertebral arteries widely patent  Right axillary artery widely patent  Right brachial artery widely patent  Right ulnar artery widely patent  Right radial artery proximally patent and tapers off distally, not present distally, no evidence of obvious dissection  Intact palmar arch with intact paired digital arteries throughout except thumb which demonstrates patent digital artery to proximal phalange but no evidence of distal phalange perfusion  SPECIMEN(S):  none  INDICATIONS:   [name] is a 46 y.o. male who presents with right thumb ischemia.  The patient presents for: arch aortogram and right arm angiogram.  I discussed with the patient the nature of angiographic procedures, especially the limited patencies of any endovascular intervention.  The patient is aware of that the risks of an angiographic procedure include but are not limited to: bleeding, infection, access site complications, renal failure, embolization, rupture of vessel, dissection, possible need for emergent surgical intervention, possible need for surgical procedures to treat the patient's pathology, and stroke and death.  The patient is aware of the risks and agrees to proceed.  DESCRIPTION: After full informed consent was obtained from the patient, the patient was brought back to the angiography suite.  The patient was placed supine  upon the angiography table and connected to monitoring equipment.  The patient was then given conscious sedation, the amounts of which are documented in the patient's chart.  The patient was prepped and drape in the standard fashion for an angiographic procedure.  At this point, attention was turned to the right groin.  Under ultrasound guidance, the right common femoral artery will be cannulated with a 18 gauge needle.  The [facility] wire was passed up into the aorta.  The needle was exchanged for a 5-Fr sheath, which was advanced over the wire into the common femoral artery.  The dilator was then removed.  The Pigtail catheter was then loaded over the wire up to the level of the ascending aorta.  The catheter was connected to the power injector circuit.  After de-airring and de-clotting the circuit, a power injector arch aortogram was completed at 40 degrees LAO.  The findings are as listed above.  I then pulled the catheter back into the descending thoracic aorta and exchanged the catheter for a JB-1 catheter.  Using this wire and catheter, I selected out the innominate artery and advanced into the innominate artery.  I then selected out the right subclavian artery and advanced the catheter into the proximal axillary artery.  The catheter was connected to the power injector circuit.  After de-airring and de-clotting the circuit, a power injector right arm angiogram was completed in stations.  I replaced the wire in the catheter and removed both the catheter and wire.  The sheath was aspirated.  No clots were present and the sheath was reloaded with heparinized saline.    COMPLICATIONS: none  CONDITION: stable   Leonides Sake, MD [facility] [place] Office: [phone] Pager:  [phone]  07/23/2011, 12:09 PM

## 2011-07-23 NOTE — Progress Notes (Signed)
ANTICOAGULATION CONSULT NOTE - Follow Up Consult  Pharmacy Consult for heparin Indication: radial artery thrombus  Labs:  [facility] 07/23/11 0052 07/22/11 1210  HGB 14.5 15.2  HCT 41.0 43.2  PLT 258 262  APTT 64* 33  LABPROT -- 17.0*  INR -- 1.36  HEPARINUNFRC 0.75* --  CREATININE 1.40* --  CKTOTAL -- --  CKMB -- --  TROPONINI -- --    Assessment: 46yo male slightly supratherapeutic on heparin with initial dosing for radial artery thrombus; pt was previously on Xarelto, which could also affect level.  Goal of Therapy:  Heparin level 0.3-0.7 units/ml  Plan:  Will decrease heparin slightly to 1100 units/hr and check level in 6hr.  Colleen Can PharmD BCPS 07/23/2011,2:16 AM

## 2011-07-23 NOTE — Care Management Note (Signed)
    Page 1 of 1   07/25/2011     10:55:39 AM   CARE MANAGEMENT NOTE 07/25/2011  Patient:  [name]   Account Number:  [id number]  Date Initiated:  07/23/2011  Documentation initiated by:  Letha Cape  Subjective/Objective Assessment:   dx thrombosis/embolism arterial  admit- lives with cousin and son.  pta independent.     Action/Plan:   Anticipated DC Date:  07/25/2011   Anticipated DC Plan:  HOME/SELF CARE      DC Planning Services  CM consult      Choice offered to / List presented to:             Status of service:  Completed, signed off Medicare Important Message given?   (If response is "NO", the following Medicare IM given date fields will be blank) Date Medicare IM given:   Date Additional Medicare IM given:    Discharge Disposition:  HOME/SELF CARE  Per UR Regulation:  Reviewed for med. necessity/level of care/duration of stay  If discussed at Long Length of Stay Meetings, dates discussed:    Comments:  [facility]  07/25/11 10:38 Letha Cape RN, BSN [phone] patient is for dc today, he states he would like to talk to someone from financial counseling about getting disability. I called financial counseling and spoke with Gearldine Bienenstock who said she will have someone to call patient today in his room.  Patient has all his meds filled at home he does not need any medication assistance or transportation assistance.  Patient has follow up appt with a hand specialist.   07/23/11 16:53 Letha Cape RN, BSN [phone] patient lives with cousin and son, pta independent. Patient states he gets his medications from the [place] in [place] and he has all of his meds filled already at home unless he is started on something new.  Patient has transportation when he is discharged.  NCM will continue to follow for dc needs.  Patient is eligible for med ast.

## 2011-07-23 NOTE — Progress Notes (Signed)
[facility] [place]  Daily Progress Note  Assessment/Planning: R radial artery dissection   Hold Heparin  RUE angiogram this AM  Likely home after angiogram today  Per Hospitalist, pt has known thrombophilia with life long need for anticoagulation with Xarelto  Subjective    No changes overnight  Objective Filed Vitals:   07/22/11 1414 07/22/11 1757 07/22/11 2025 07/23/11 0449  BP: 149/94 144/96 160/81 133/76  Pulse: 58 63 64 65  Temp: 97.9 F (36.6 C)  98.3 F (36.8 C) 97.7 F (36.5 C)  TempSrc: Oral  Oral Oral  Resp: 22 17 16 16   Height:   5' 9.6" (1.768 m)   Weight:   196 lb 6.4 oz (89.086 kg)   SpO2: 95% 95% 90% 95%    Intake/Output Summary (Last 24 hours) at 07/23/11 0912 Last data filed at 07/23/11 0600  Gross per 24 hour  Intake 235.93 ml  Output      0 ml  Net 235.93 ml   VASC  R hand warm and perfuse except thumb which has some mottling, palpable ulnar, faintly palpable radial  Laboratory CBC    Component Value Date/Time   WBC 7.2 07/23/2011 0052   HGB 14.5 07/23/2011 0052   HCT 41.0 07/23/2011 0052   PLT 258 07/23/2011 0052    BMET    Component Value Date/Time   NA 136 07/23/2011 0052   K 3.6 07/23/2011 0052   CL 104 07/23/2011 0052   CO2 25 07/23/2011 0052   GLUCOSE 129* 07/23/2011 0052   BUN 16 07/23/2011 0052   CREATININE 1.40* 07/23/2011 0052   CALCIUM 8.5 07/23/2011 0052   GFRNONAA 59* 07/23/2011 0052   GFRAA 68* 07/23/2011 0052    Leonides Sake, MD [facility] [place] Office: [phone] Pager: [phone]  07/23/2011, 9:12 AM

## 2011-07-23 NOTE — Progress Notes (Signed)
DAILY PROGRESS NOTE                              GENERAL INTERNAL MEDICINE [facility]  SUBJECTIVE: Complaining about right thumb pain.  OBJECTIVE: BP 133/76  Pulse 55  Temp(Src) 97.7 F (36.5 C) (Oral)  Resp 16  Ht 5' 9.6" (1.768 m)  Wt 89.086 kg (196 lb 6.4 oz)  BMI 28.51 kg/m2  SpO2 95%  Intake/Output Summary (Last 24 hours) at 07/23/11 1237 Last data filed at 07/23/11 1109  Gross per 24 hour  Intake 238.93 ml  Output      0 ml  Net 238.93 ml                      Weight change:  Physical Exam: General: Alert and awake oriented x3 not in any acute distress. HEENT: anicteric sclera, pupils equal reactive to light and accommodation CVS: S1-S2 heard, no murmur rubs or gallops Chest: clear to auscultation bilaterally, no wheezing rales or rhonchi Abdomen:  normal bowel sounds, soft, nontender, nondistended, no organomegaly Neuro: Cranial nerves II-XII intact, no focal neurological deficits Extremities: no cyanosis, no clubbing or edema noted bilaterally   Lab Results:  Basename 07/23/11 0052  NA 136  K 3.6  CL 104  CO2 25  GLUCOSE 129*  BUN 16  CREATININE 1.40*  CALCIUM 8.5  MG --  PHOS --   No results found for this basename: AST:2,ALT:2,ALKPHOS:2,BILITOT:2,PROT:2,ALBUMIN:2 in the last 72 hours No results found for this basename: LIPASE:2,AMYLASE:2 in the last 72 hours  Basename 07/23/11 0052 07/22/11 1210  WBC 7.2 6.5  NEUTROABS -- 3.6  HGB 14.5 15.2  HCT 41.0 43.2  MCV 93.2 94.3  PLT 258 262   No results found for this basename: CKTOTAL:3,CKMB:3,CKMBINDEX:3,TROPONINI:3 in the last 72 hours No components found with this basename: POCBNP:3 No results found for this basename: DDIMER:2 in the last 72 hours No results found for this basename: HGBA1C:2 in the last 72 hours No results found for this basename: CHOL:2,HDL:2,LDLCALC:2,TRIG:2,CHOLHDL:2,LDLDIRECT:2 in the last 72 hours No results found for this basename: TSH,T4TOTAL,FREET3,T3FREE,THYROIDAB  in the last 72 hours No results found for this basename: VITAMINB12:2,FOLATE:2,FERRITIN:2,TIBC:2,IRON:2,RETICCTPCT:2 in the last 72 hours  Micro Results: No results found for this or any previous visit (from the past 240 hour(s)).  Studies/Results: Dg Wrist Complete Right  07/22/2011  *RADIOLOGY REPORT*  Clinical Data: Right wrist pain and swelling, no injury  RIGHT WRIST - COMPLETE 3+ VIEW  Comparison: None  Findings: Bone mineralization normal. Joint spaces preserved. No fracture, dislocation, or bone destruction.  IMPRESSION: No acute abnormalities.  Original Report Authenticated By: Lollie Marrow, M.D.   Medications: Scheduled Meds:   . fentaNYL      . heparin      . heparin      . heparin  5,000 Units Intravenous Once  . lidocaine      . midazolam      . sodium chloride  3 mL Intravenous Q12H   Continuous Infusions:   . heparin 1,100 Units/hr (07/23/11 0230)   PRN Meds:.sodium chloride, acetaminophen, acetaminophen, HYDROcodone-acetaminophen, ondansetron (ZOFRAN) IV, ondansetron, senna-docusate, sodium chloride, DISCONTD:  morphine injection  ASSESSMENT & PLAN: Principal Problem:  *Thrombosis/embolism, arterial Active Problems:  Pain, wrist, right  History of pulmonary embolism  BPH (benign prostatic hyperplasia)  Obstructive uropathy  HTN (hypertension)  History of DVT of lower extremity  Former cigar smoker  Dissection of other artery  Noncompliance w/medication treatment due to intermit use of medication   Arterial thrombosis -Patient seen by Dr. Imogene Burn from vascular surgery. -Evaluated by arteriogram this morning. -Arteriogram showed tapered radial artery, and absent digital artery perfusion to the distal phalanx. -Patient developed tissue loss of the right thumb he might need services hand surgeon. -There is no surgically amenable disease. Patient needs pain control.  History of DVT/PE -Patient mentioned that he has some type of thrombophilia, he was on  Coumadin for it. -Now present PRADAXA, he mentioned that he was advised he will be on PRADAXA for the rest of his life. -Will restart that at the time of discharge. Patient currently on heparin drip.  Right wrist pain -This is secondary to the right radial artery thrombosis/dissection. -Being controlled with narcotics.  Thrombophilia -Patient does not know what type of hypercoagulable state he does have. -Is on PRADAXA as outpatient, as will be continued on patient discharge.   LOS: 1 day   [name] A 07/23/2011, 12:37 PM

## 2011-07-23 NOTE — Progress Notes (Signed)
[facility]  The patient angiogram demonstrates normal anatomy except for tapering of the radial artery distally and absent digital arteries in the distal thumb phalange.  There is no surgically amendable disease.  I would recommend continue anticoagulation and follow up as an outpatient in one month in my office [phone]).   If he develops tissue loss in the right thumb, he may need the services of a hand surgeon.     Leonides Sake, MD [facility] [place] Office: [phone] Pager: [phone]  07/23/2011, 12:20 PM

## 2011-07-24 ENCOUNTER — Encounter (HOSPITAL_COMMUNITY): Payer: Self-pay | Admitting: Orthopedic Surgery

## 2011-07-24 ENCOUNTER — Telehealth: Payer: Self-pay | Admitting: Vascular Surgery

## 2011-07-24 DIAGNOSIS — Z86711 Personal history of pulmonary embolism: Secondary | ICD-10-CM

## 2011-07-24 DIAGNOSIS — M79609 Pain in unspecified limb: Secondary | ICD-10-CM

## 2011-07-24 DIAGNOSIS — N401 Enlarged prostate with lower urinary tract symptoms: Secondary | ICD-10-CM

## 2011-07-24 DIAGNOSIS — I742 Embolism and thrombosis of arteries of the upper extremities: Secondary | ICD-10-CM

## 2011-07-24 LAB — CBC
HCT: 41.9 % (ref 39.0–52.0)
MCH: 32.5 pg (ref 26.0–34.0)
MCV: 94.6 fL (ref 78.0–100.0)
Platelets: 265 10*3/uL (ref 150–400)
RBC: 4.43 MIL/uL (ref 4.22–5.81)

## 2011-07-24 MED ORDER — RIVAROXABAN 10 MG PO TABS
20.0000 mg | ORAL_TABLET | Freq: Every day | ORAL | Status: DC
  Administered 2011-07-24: 20 mg via ORAL
  Filled 2011-07-24 (×2): qty 2

## 2011-07-24 MED ORDER — RIVAROXABAN 20 MG PO TABS: 20.0000 mg | ORAL_TABLET | Freq: Every day | ORAL | Status: DC

## 2011-07-24 MED ORDER — SULFAMETHOXAZOLE-TMP DS 800-160 MG PO TABS
1.0000 | ORAL_TABLET | Freq: Two times a day (BID) | ORAL | Status: DC
  Administered 2011-07-24 – 2011-07-25 (×3): 1 via ORAL
  Filled 2011-07-24 (×4): qty 1

## 2011-07-24 NOTE — Progress Notes (Signed)
Addendum  Patient seen and examined, chart and data base reviewed.  I agree with the above assessment and plan  For full details please see Mrs. Cordelia Pen, NP note.  Clint Lipps Pager: [phone] 07/24/2011, 3:21 PM

## 2011-07-24 NOTE — Telephone Encounter (Addendum)
Message copied by Shari Prows on Thu Jul 24, 2011  9:22 AM ------      Message from: Phillips Odor      Created: Wed Jul 23, 2011  1:43 PM      Regarding: follow up appt.                   ----- Message -----         From: Fransisco Hertz, MD         Sent: 07/23/2011  12:23 PM           To: Cydney Ok, Melene Plan, RN            CARSTON RIEDL      [id number]      1965-11-27            PROCEDURE:      1.  Right common femoral artery cannulation under ultrasound guidance      2.  Arch aortogram      3.  Second order arterial selection      3.  Right arm angiogram            Follow-up: 4 weeks I scheduled an appt for this patient on Friday 08/22/11 at 2:45pm. I spoke w/ this patient regarding this and mailed letter also. Jacklyn Shell

## 2011-07-24 NOTE — Consult Note (Signed)
[name] is an 46 y.o. male.   Consult: right wrist pain From: Dr. Arthor Captain HPI: 46 yo rhd male with right wrist pain x 3 days.  Doesn't remember any injury or wound to wrist.  No previous problems with right wrist other than sprain when younger.  No history of gout.  Pain not worsening but not getting better.  Has had no direct treatment to wrist.  Used Icy Hot type medication once with some relief.  Past Medical History  Diagnosis Date  . DVT (deep venous thrombosis) 01/08/2011    lt leg  . Hypertension   . DVT of lower limb, acute 01/12/2011  . BPH (benign prostatic hyperplasia)   . Current use of long term anticoagulation   . 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 Noncompliance w/medication treatment due to intermit use of medication     Past Surgical History  Procedure Date  . Tonsillectomy     History reviewed. No pertinent family history. Social History:  reports that he quit smoking about 10 months ago. His smoking use included Cigars. He has never used smokeless tobacco. He reports that he drinks about 1.2 ounces of alcohol per week. He reports that he does not use illicit drugs.  Allergies: No Known Allergies  Medications Prior to Admission  Medication Sig Dispense Refill  . acetaminophen (TYLENOL ARTHRITIS PAIN) 650 MG CR tablet Take 1,300 mg by mouth every 8 (eight) hours as needed. For arthritis pain      . Rivaroxaban (XARELTO) 20 MG TABS Take 20 mg by mouth daily.        Results for orders placed during the hospital encounter of 07/22/11 (from the past 48 hour(s))  HEPARIN LEVEL (UNFRACTIONATED)     Status: Abnormal   Collection Time   07/23/11 12:52 AM      Component Value Range Comment   Heparin Unfractionated 0.75 (*) 0.30 - 0.70 (IU/mL)   BASIC METABOLIC PANEL     Status: Abnormal   Collection Time   07/23/11 12:52 AM      Component Value Range Comment   Sodium 136  135 - 145 (mEq/L)    Potassium 3.6   3.5 - 5.1 (mEq/L)    Chloride 104  96 - 112 (mEq/L)    CO2 25  19 - 32 (mEq/L)    Glucose, Bld 129 (*) 70 - 99 (mg/dL)    BUN 16  6 - 23 (mg/dL)    Creatinine, Ser 1.61 (*) 0.50 - 1.35 (mg/dL)    Calcium 8.5  8.4 - 10.5 (mg/dL)    GFR calc non Af Amer 59 (*) >90 (mL/min)    GFR calc Af Amer 68 (*) >90 (mL/min)   CBC     Status: Normal   Collection Time   07/23/11 12:52 AM      Component Value Range Comment   WBC 7.2  4.0 - 10.5 (K/uL)    RBC 4.40  4.22 - 5.81 (MIL/uL)    Hemoglobin 14.5  13.0 - 17.0 (g/dL)    HCT 09.6  04.5 - 40.9 (%)    MCV 93.2  78.0 - 100.0 (fL)    MCH 33.0  26.0 - 34.0 (pg)    MCHC 35.4  30.0 - 36.0 (g/dL)    RDW 81.1  91.4 - 78.2 (%)    Platelets 258  150 - 400 (K/uL)   APTT     Status: Abnormal   Collection Time  07/23/11 12:52 AM      Component Value Range Comment   aPTT 64 (*) 24 - 37 (seconds)   HEPARIN LEVEL (UNFRACTIONATED)     Status: Normal   Collection Time   07/23/11  9:04 AM      Component Value Range Comment   Heparin Unfractionated 0.56  0.30 - 0.70 (IU/mL)   POCT ACTIVATED CLOTTING TIME     Status: Normal   Collection Time   07/23/11 12:05 PM      Component Value Range Comment   Activated Clotting Time 105     CBC     Status: Normal   Collection Time   07/24/11  5:45 AM      Component Value Range Comment   WBC 5.7  4.0 - 10.5 (K/uL)    RBC 4.43  4.22 - 5.81 (MIL/uL)    Hemoglobin 14.4  13.0 - 17.0 (g/dL)    HCT 29.5  28.4 - 13.2 (%)    MCV 94.6  78.0 - 100.0 (fL)    MCH 32.5  26.0 - 34.0 (pg)    MCHC 34.4  30.0 - 36.0 (g/dL)    RDW 44.0  10.2 - 72.5 (%)    Platelets 265  150 - 400 (K/uL)   HEPARIN LEVEL (UNFRACTIONATED)     Status: Normal   Collection Time   07/24/11  5:45 AM      Component Value Range Comment   Heparin Unfractionated 0.39  0.30 - 0.70 (IU/mL)     No results found.   A comprehensive review of systems was negative.  Blood pressure 126/77, pulse 67, temperature 97.9 F (36.6 C), temperature source Oral, resp.  rate 20, height 5' 9.6" (1.768 m), weight 89.086 kg (196 lb 6.4 oz), SpO2 97.00%.  General appearance: alert, cooperative and appears stated age Head: Normocephalic, without obvious abnormality, atraumatic Extremities: Left UE: sensation and capillary refill intact all digits.  +epl/fpl/io.  no wounds or tenderness.  small splinter hemorrhage type mark in small fingernail.  Right UE: light touch sensation intact all digits.  capillary refill intact all digits, but slow in thumb.  area of ischemia on pad of thumb.  splinter hemorrhages in thumbnail.  tender over thumb and thenar eminence.  +epl/fpl/io.  uncomfortable to make fist.  tender and volar radial side of wrist directly over radial artery.  not as tender over fcr tendon.  can flex wrist against resistance.  no tenderness at anatomic snuffbox, sl ligament, lt ligament, ulnar styloid, 1st dorsal compartment.  wrist not swollen or erythematous or warm. Pulses: right radial pulse palpable just proximal to wrist. Skin: as above Neurologic: Grossly normal Incision/Wound: na  Assessment/Plan Right wrist pain seems related to radial artery rather than joint itself.  No signs of joint infection or gout.  Tendon function intact.  Recommend wrist splint for comfort.  Patient currently on anticoagulation.  Arteriogram showed very small vessels in thumb and poor radial artery flow.  Per vascular no surgical intervention.  Will follow up as outpatient.    [name] R 07/24/2011, 5:13 PM

## 2011-07-24 NOTE — Progress Notes (Signed)
Asked by RN to examine painful area on pt's abdomen. Small blister-like lesion just above umbilicus with minimal amount of sanguinous drainage and mild surrounding erythema c/w folliculitis. Will order Septra DS to treat.  Cordelia Pen, NP-C [facility]  pgr [phone]

## 2011-07-24 NOTE — Progress Notes (Signed)
DAILY PROGRESS NOTE                              GENERAL INTERNAL MEDICINE [facility]  SUBJECTIVE: Complaining about right wrist and thumb pain  OBJECTIVE: BP 126/77  Pulse 67  Temp(Src) 97.9 F (36.6 C) (Oral)  Resp 20  Ht 5' 9.6" (1.768 m)  Wt 89.086 kg (196 lb 6.4 oz)  BMI 28.51 kg/m2  SpO2 97%  Intake/Output Summary (Last 24 hours) at 07/24/11 1457 Last data filed at 07/24/11 1435  Gross per 24 hour  Intake 2316.14 ml  Output   1925 ml  Net 391.14 ml                      Weight change:  Physical Exam: General: Alert and awake oriented x3 not in any acute distress. HEENT: anicteric sclera, pupils equal reactive to light and accommodation CVS: S1-S2 heard, no murmur rubs or gallops Chest: clear to auscultation bilaterally, no wheezing rales or rhonchi Abdomen:  normal bowel sounds, soft, nontender, nondistended, no organomegaly Neuro: Cranial nerves II-XII intact, no focal neurological deficits Extremities: no cyanosis, no clubbing or edema noted bilaterally   Lab Results:  Basename 07/23/11 0052  NA 136  K 3.6  CL 104  CO2 25  GLUCOSE 129*  BUN 16  CREATININE 1.40*  CALCIUM 8.5  MG --  PHOS --   No results found for this basename: AST:2,ALT:2,ALKPHOS:2,BILITOT:2,PROT:2,ALBUMIN:2 in the last 72 hours No results found for this basename: LIPASE:2,AMYLASE:2 in the last 72 hours  Basename 07/24/11 0545 07/23/11 0052 07/22/11 1210  WBC 5.7 7.2 --  NEUTROABS -- -- 3.6  HGB 14.4 14.5 --  HCT 41.9 41.0 --  MCV 94.6 93.2 --  PLT 265 258 --   No results found for this basename: CKTOTAL:3,CKMB:3,CKMBINDEX:3,TROPONINI:3 in the last 72 hours No components found with this basename: POCBNP:3 No results found for this basename: DDIMER:2 in the last 72 hours No results found for this basename: HGBA1C:2 in the last 72 hours No results found for this basename: CHOL:2,HDL:2,LDLCALC:2,TRIG:2,CHOLHDL:2,LDLDIRECT:2 in the last 72 hours No results found for this  basename: TSH,T4TOTAL,FREET3,T3FREE,THYROIDAB in the last 72 hours No results found for this basename: VITAMINB12:2,FOLATE:2,FERRITIN:2,TIBC:2,IRON:2,RETICCTPCT:2 in the last 72 hours  Micro Results: No results found for this or any previous visit (from the past 240 hour(s)).  Studies/Results: Dg Wrist Complete Right  07/22/2011  *RADIOLOGY REPORT*  Clinical Data: Right wrist pain and swelling, no injury  RIGHT WRIST - COMPLETE 3+ VIEW  Comparison: None  Findings: Bone mineralization normal. Joint spaces preserved. No fracture, dislocation, or bone destruction.  IMPRESSION: No acute abnormalities.  Original Report Authenticated By: Lollie Marrow, M.D.   Medications: Scheduled Meds:    . sodium chloride  3 mL Intravenous Q12H  . sulfamethoxazole-trimethoprim  1 tablet Oral Q12H  . DISCONTD: Rivaroxaban  20 mg Oral Daily   Continuous Infusions:    . sodium chloride 1 mL/kg/hr (07/24/11 0909)  . DISCONTD: heparin 1,100 Units/hr (07/23/11 2028)   PRN Meds:.sodium chloride, acetaminophen, acetaminophen, acetaminophen, HYDROcodone-acetaminophen, morphine injection, ondansetron (ZOFRAN) IV, ondansetron, oxyCODONE-acetaminophen, senna-docusate, sodium chloride  ASSESSMENT & PLAN: Principal Problem:  *Thrombosis/embolism, arterial Active Problems:  Pain, wrist, right  History of pulmonary embolism  BPH (benign prostatic hyperplasia)  Obstructive uropathy  HTN (hypertension)  History of DVT of lower extremity  Former cigar smoker  Dissection of other artery  Noncompliance w/medication treatment due to intermit use  of medication   Arterial thrombosis -Patient seen by Dr. Imogene Burn from vascular surgery. -Evaluated by arteriogram this morning. -Arteriogram showed tapered radial artery, and absent digital artery perfusion to the distal phalanx. -Patient developed tissue loss of the right thumb he might need services hand surgeon. -There is no surgically amenable disease. Patient needs  pain control.  History of DVT/PE -Patient mentioned that he has some type of thrombophilia, he was on Coumadin for it. -Now present PRADAXA, he mentioned that he was advised he will be on PRADAXA for the rest of his life. -Will restart that at the time of discharge. Patient currently on heparin drip.  Right wrist pain -Thought initially to be secondary to the right radial artery thrombosis/dissection. -Patient seems to have muscular component of it as it is painful with movement. -We will obtain an MRI of the right wrist.  Thrombophilia -Patient does not know what type of hypercoagulable state he does have. -Is on PRADAXA as outpatient, as will be continued on patient discharge.   LOS: 2 days   Gelsey Amyx A 07/24/2011, 2:57 PM

## 2011-07-24 NOTE — Progress Notes (Signed)
ANTICOAGULATION CONSULT NOTE - Follow Up Consult  Pharmacy Consult for heparin Indication: radial artery thrombus  Labs:  Basename 07/24/11 0545 07/23/11 0904 07/23/11 0052 07/22/11 1210  HGB 14.4 -- 14.5 --  HCT 41.9 -- 41.0 43.2  PLT 265 -- 258 262  APTT -- -- 64* 33  LABPROT -- -- -- 17.0*  INR -- -- -- 1.36  HEPARINUNFRC 0.39 0.56 0.75* --  CREATININE -- -- 1.40* --  CKTOTAL -- -- -- --  CKMB -- -- -- --  TROPONINI -- -- -- --    Assessment: 46yo male with therapeutic heparin level  Previously on Xarelto  Goal of Therapy:  Heparin level 0.3-0.7 units/ml  Plan:  1) Continue heparin at 1100 units / hr 2) Follow up AM  Elwin Sleight PharmD  07/24/2011,9:49 AM

## 2011-07-24 NOTE — Progress Notes (Signed)
[facility]  Daily Progress Note  Assessment/Planning: POD #1 s/p Arch aortogram, R arm angiogram   No complications from procedure  Cont anticoagulation  Follow up in the office in 4 weeks  Subjective  - 1 Day Post-Op  Pain in R thumb unchanged  Objective Filed Vitals:   07/23/11 1414 07/23/11 2205 07/24/11 0542 07/24/11 0805  BP: 141/75 131/74 119/67 131/76  Pulse: 90 65 58 60  Temp:  98.7 F (37.1 C) 98.1 F (36.7 C) 98 F (36.7 C)  TempSrc:  Oral Oral Oral  Resp:  18 20 20   Height:      Weight:      SpO2:  96% 94% 96%    Intake/Output Summary (Last 24 hours) at 07/24/11 0930 Last data filed at 07/24/11 0727  Gross per 24 hour  Intake 1959.14 ml  Output   1675 ml  Net 284.14 ml    PULM  CTAB CV  RRR GI  soft, NTND VASC  R thumb slightly cyanotic but appears viable, palp. Right radial pulse, R groin without hematoma or echymosis  Laboratory CBC    Component Value Date/Time   WBC 5.7 07/24/2011 0545   HGB 14.4 07/24/2011 0545   HCT 41.9 07/24/2011 0545   PLT 265 07/24/2011 0545    BMET    Component Value Date/Time   NA 136 07/23/2011 0052   K 3.6 07/23/2011 0052   CL 104 07/23/2011 0052   CO2 25 07/23/2011 0052   GLUCOSE 129* 07/23/2011 0052   BUN 16 07/23/2011 0052   CREATININE 1.40* 07/23/2011 0052   CALCIUM 8.5 07/23/2011 0052   GFRNONAA 59* 07/23/2011 0052   GFRAA 68* 07/23/2011 0052    Leonides Sake, MD [facility] Office: [phone] Pager: [phone]  07/24/2011, 9:30 AM

## 2011-07-24 NOTE — Progress Notes (Signed)
Orthopedic Tech Progress Note Patient Details:  [name] 06-20-1965 [id number]  Type of Splint: Other (comment) (velcro wrist splint) Splint Location: right wrist Splint Interventions: Application    Nikki Dom 07/24/2011, 9:46 PM

## 2011-07-24 NOTE — Plan of Care (Signed)
Problem: Phase I Progression Outcomes Goal: Initial discharge plan identified Outcome: Completed/Met Date Met:  07/24/11 To return home

## 2011-07-24 NOTE — Progress Notes (Signed)
Noted bruising on buttocks &  Sl abrasion on inner cheek.Pt.claimed he got it  From metal rail in his house from moving furniture.No bleeding noted.Applied small foam dsd.MD  On call Maren Reamer made aware. ,& ordered to observe pt.

## 2011-07-25 ENCOUNTER — Inpatient Hospital Stay (HOSPITAL_COMMUNITY): Payer: Non-veteran care

## 2011-07-25 DIAGNOSIS — Z86711 Personal history of pulmonary embolism: Secondary | ICD-10-CM

## 2011-07-25 DIAGNOSIS — M79609 Pain in unspecified limb: Secondary | ICD-10-CM

## 2011-07-25 DIAGNOSIS — N401 Enlarged prostate with lower urinary tract symptoms: Secondary | ICD-10-CM

## 2011-07-25 DIAGNOSIS — I742 Embolism and thrombosis of arteries of the upper extremities: Secondary | ICD-10-CM

## 2011-07-25 LAB — BASIC METABOLIC PANEL
BUN: 8 mg/dL (ref 6–23)
CO2: 26 mEq/L (ref 19–32)
Calcium: 8.7 mg/dL (ref 8.4–10.5)
Creatinine, Ser: 1.4 mg/dL — ABNORMAL HIGH (ref 0.50–1.35)
GFR calc non Af Amer: 59 mL/min — ABNORMAL LOW (ref >90)
Glucose, Bld: 111 mg/dL — ABNORMAL HIGH (ref 70–99)
Sodium: 135 mEq/L (ref 135–145)

## 2011-07-25 LAB — CBC
HCT: 41.2 % (ref 39.0–52.0)
Hemoglobin: 14.6 g/dL (ref 13.0–17.0)
MCH: 33.3 pg (ref 26.0–34.0)
MCHC: 35.4 g/dL (ref 30.0–36.0)
MCV: 94.1 fL (ref 78.0–100.0)

## 2011-07-25 MED ORDER — SULFAMETHOXAZOLE-TMP DS 800-160 MG PO TABS: 1.0000 | ORAL_TABLET | Freq: Two times a day (BID) | ORAL | Status: AC

## 2011-07-25 MED ORDER — RIVAROXABAN 20 MG PO TABS: 20.0000 mg | ORAL_TABLET | Freq: Every day | ORAL | Status: DC

## 2011-07-25 MED ORDER — OXYCODONE-ACETAMINOPHEN 5-325 MG PO TABS: 1.0000 | ORAL_TABLET | ORAL | Status: AC | PRN

## 2011-07-25 MED ORDER — SENNOSIDES-DOCUSATE SODIUM 8.6-50 MG PO TABS: 1.0000 | ORAL_TABLET | Freq: Every evening | ORAL | Status: DC | PRN
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
## 2011-07-25 NOTE — Discharge Summary (Signed)
Addendum  Patient seen and examined, chart and data base reviewed.  I agree with the above assessment and plan  For full details please see Mrs. Algis Downs PA. Note.  Clint Lipps Pager: [phone] 07/25/2011, 11:47 AM

## 2011-07-26 ENCOUNTER — Encounter (HOSPITAL_COMMUNITY): Payer: Self-pay

## 2011-08-21 ENCOUNTER — Encounter: Payer: Self-pay | Admitting: Vascular Surgery

## 2011-08-22 ENCOUNTER — Ambulatory Visit: Payer: Self-pay | Admitting: Vascular Surgery

## 2011-09-11 ENCOUNTER — Encounter (HOSPITAL_COMMUNITY): Payer: Self-pay | Admitting: *Deleted

## 2011-09-11 ENCOUNTER — Emergency Department (HOSPITAL_COMMUNITY)
Admission: EM | Admit: 2011-09-11 | Discharge: 2011-09-12 | Disposition: A | Discharge status: Other Acute Inpatient Hospital | Payer: Non-veteran care | Attending: Emergency Medicine | Admitting: Emergency Medicine

## 2011-09-11 DIAGNOSIS — I10 Essential (primary) hypertension: Secondary | ICD-10-CM | POA: Insufficient documentation

## 2011-09-11 DIAGNOSIS — Z86718 Personal history of other venous thrombosis and embolism: Secondary | ICD-10-CM | POA: Insufficient documentation

## 2011-09-11 DIAGNOSIS — F329 Major depressive disorder, single episode, unspecified: Secondary | ICD-10-CM

## 2011-09-11 DIAGNOSIS — F3289 Other specified depressive episodes: Secondary | ICD-10-CM | POA: Insufficient documentation

## 2011-09-11 LAB — COMPREHENSIVE METABOLIC PANEL
ALT: 31 U/L (ref 0–53)
Albumin: 3.4 g/dL — ABNORMAL LOW (ref 3.5–5.2)
Alkaline Phosphatase: 42 U/L (ref 39–117)
BUN: 16 mg/dL (ref 6–23)
Chloride: 105 mEq/L (ref 96–112)
Glucose, Bld: 117 mg/dL — ABNORMAL HIGH (ref 70–99)
Potassium: 4.2 mEq/L (ref 3.5–5.1)
Sodium: 140 mEq/L (ref 135–145)
Total Bilirubin: 0.2 mg/dL — ABNORMAL LOW (ref 0.3–1.2)
Total Protein: 5.7 g/dL — ABNORMAL LOW (ref 6.0–8.3)

## 2011-09-11 LAB — CBC WITH DIFFERENTIAL/PLATELET
Basophils Relative: 0 % (ref 0–1)
Eosinophils Absolute: 0 10*3/uL (ref 0.0–0.7)
Hemoglobin: 16.4 g/dL (ref 13.0–17.0)
Lymphs Abs: 1.6 10*3/uL (ref 0.7–4.0)
MCH: 34 pg (ref 26.0–34.0)
Monocytes Relative: 6 % (ref 3–12)
Neutro Abs: 4.6 10*3/uL (ref 1.7–7.7)
Neutrophils Relative %: 69 % (ref 43–77)
Platelets: 250 10*3/uL (ref 150–400)
RBC: 4.83 MIL/uL (ref 4.22–5.81)

## 2011-09-11 LAB — URINALYSIS, ROUTINE W REFLEX MICROSCOPIC
Bilirubin Urine: NEGATIVE
Glucose, UA: NEGATIVE mg/dL
Hgb urine dipstick: NEGATIVE
Ketones, ur: NEGATIVE mg/dL
Protein, ur: NEGATIVE mg/dL
Urobilinogen, UA: 0.2 mg/dL (ref 0.0–1.0)

## 2011-09-11 LAB — RAPID URINE DRUG SCREEN, HOSP PERFORMED
Amphetamines: NOT DETECTED
Barbiturates: NOT DETECTED
Tetrahydrocannabinol: POSITIVE — AB

## 2011-09-11 MED ORDER — IBUPROFEN 400 MG PO TABS
600.0000 mg | ORAL_TABLET | Freq: Once | ORAL | Status: AC
  Administered 2011-09-11: 600 mg via ORAL
  Filled 2011-09-11: qty 1

## 2011-09-11 NOTE — ED Provider Notes (Signed)
History     CSN: [id number]  Arrival date & time 09/11/11  1820   First MD Initiated Contact with Patient 09/11/11 1846      Chief Complaint  Patient presents with  . Depression    (Consider location/radiation/quality/duration/timing/severity/associated sxs/prior treatment) HPI Pt has had increasing depressive thoughts and occasional SI without definite plan. Has history of depression but is not currently taking meds for it. Was hospitalized for depression at [facility] years ago. Admit to "drug problem". States he use cocaine regularly. Last used this AM. Drink EtOH occasionally. Last drink this AM. No other coingestants  Past Medical History  Diagnosis Date  . DVT (deep venous thrombosis) 01/08/2011    lt leg  . Hypertension   . DVT of lower limb, acute 01/12/2011  . BPH (benign prostatic hyperplasia)   . Current use of long term anticoagulation   . 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 Noncompliance w/medication treatment due to intermit use of medication     Past Surgical History  Procedure Date  . Tonsillectomy     History reviewed. No pertinent family history.  History  Substance Use Topics  . Smoking status: Former Smoker -- 6 years    Types: Cigars    Quit date: 09/14/2010  . Smokeless tobacco: Never Used  . Alcohol Use: 1.2 oz/week    2 Cans of beer per week      Review of Systems  Constitutional: Negative for fever and chills.  Respiratory: Negative for shortness of breath.   Cardiovascular: Negative for chest pain.  Gastrointestinal: Negative for nausea, vomiting and abdominal pain.  Skin: Negative for rash and wound.  Neurological: Negative for dizziness, weakness, light-headedness and headaches.  Psychiatric/Behavioral: Positive for suicidal ideas, dysphoric mood and decreased concentration. Negative for self-injury.    Allergies  Review of patient's allergies indicates no known allergies.  Home  Medications   Current Outpatient Rx  Name Route Sig Dispense Refill  . ACETAMINOPHEN ER 650 MG PO TBCR Oral Take 1,300 mg by mouth every 8 (eight) hours as needed. For arthritis pain    . RIVAROXABAN 20 MG PO TABS Oral Take 20 mg by mouth daily. 30 tablet 0  . SENNOSIDES-DOCUSATE SODIUM 8.6-50 MG PO TABS Oral Take 1 tablet by mouth at bedtime as needed (Use while taking narcotic pain medication to prevent constipation).      BP 147/83  Pulse 60  Temp 98 F (36.7 C) (Oral)  Resp 18  SpO2 98%  Physical Exam  Nursing note and vitals reviewed. Constitutional: He is oriented to person, place, and time. He appears well-developed and well-nourished. No distress.  HENT:  Head: Normocephalic and atraumatic.  Mouth/Throat: Oropharynx is clear and moist.  Eyes: EOM are normal. Pupils are equal, round, and reactive to light.  Neck: Normal range of motion. Neck supple.  Cardiovascular: Normal rate and regular rhythm.   Pulmonary/Chest: Effort normal and breath sounds normal. No respiratory distress. He has no wheezes. He has no rales.  Abdominal: Soft. Bowel sounds are normal. There is no tenderness. There is no rebound and no guarding.  Musculoskeletal: Normal range of motion. He exhibits no edema and no tenderness.  Neurological: He is alert and oriented to person, place, and time.  Skin: Skin is warm and dry. No rash noted. No erythema.  Psychiatric:       Flat affect    ED Course  Procedures (including critical care time)  Labs Reviewed  URINALYSIS, ROUTINE W REFLEX MICROSCOPIC - Abnormal; Notable for the following:    Leukocytes, UA TRACE (*)     All other components within normal limits  URINE RAPID DRUG SCREEN (HOSP PERFORMED) - Abnormal; Notable for the following:    Cocaine POSITIVE (*)     Tetrahydrocannabinol POSITIVE (*)     All other components within normal limits  CBC WITH DIFFERENTIAL - Abnormal; Notable for the following:    MCHC 36.1 (*)     All other components  within normal limits  COMPREHENSIVE METABOLIC PANEL - Abnormal; Notable for the following:    Glucose, Bld 117 (*)     Creatinine, Ser 1.50 (*)     Total Protein 5.7 (*)     Albumin 3.4 (*)     Total Bilirubin 0.2 (*)     GFR calc non Af Amer 54 (*)     GFR calc Af Amer 63 (*)     All other components within normal limits  ETHANOL  URINE MICROSCOPIC-ADD ON   No results found.   No diagnosis found.    MDM          Loren Racer, MD 09/11/11 [phone]

## 2011-09-11 NOTE — ED Notes (Signed)
AC notified of need for sitter.  AC is making arrangement.

## 2011-09-11 NOTE — ED Notes (Addendum)
Pt moving to C21. RN aware. Pt has been wanded by security. Pt is in paper scrubs.

## 2011-09-11 NOTE — ED Notes (Signed)
The pt wants help dealing with issues.

## 2011-09-12 ENCOUNTER — Inpatient Hospital Stay (HOSPITAL_COMMUNITY)
Admission: EM | Admit: 2011-09-12 | Discharge: 2011-09-17 | DRG: 885 | Payer: Non-veteran care | Source: Ambulatory Visit | Attending: Psychiatry | Admitting: Psychiatry

## 2011-09-12 DIAGNOSIS — Z91199 Patient's noncompliance with other medical treatment and regimen due to unspecified reason: Secondary | ICD-10-CM

## 2011-09-12 DIAGNOSIS — I82409 Acute embolism and thrombosis of unspecified deep veins of unspecified lower extremity: Secondary | ICD-10-CM

## 2011-09-12 DIAGNOSIS — F332 Major depressive disorder, recurrent severe without psychotic features: Principal | ICD-10-CM | POA: Diagnosis present

## 2011-09-12 DIAGNOSIS — Z9114 Patient's other noncompliance with medication regimen: Secondary | ICD-10-CM

## 2011-09-12 DIAGNOSIS — N400 Enlarged prostate without lower urinary tract symptoms: Secondary | ICD-10-CM | POA: Diagnosis present

## 2011-09-12 DIAGNOSIS — I10 Essential (primary) hypertension: Secondary | ICD-10-CM | POA: Diagnosis present

## 2011-09-12 DIAGNOSIS — F1410 Cocaine abuse, uncomplicated: Secondary | ICD-10-CM | POA: Diagnosis present

## 2011-09-12 DIAGNOSIS — I7779 Dissection of other artery: Secondary | ICD-10-CM

## 2011-09-12 DIAGNOSIS — Z86718 Personal history of other venous thrombosis and embolism: Secondary | ICD-10-CM

## 2011-09-12 DIAGNOSIS — Z9119 Patient's noncompliance with other medical treatment and regimen: Secondary | ICD-10-CM

## 2011-09-12 DIAGNOSIS — F1994 Other psychoactive substance use, unspecified with psychoactive substance-induced mood disorder: Secondary | ICD-10-CM | POA: Diagnosis present

## 2011-09-12 DIAGNOSIS — F1420 Cocaine dependence, uncomplicated: Secondary | ICD-10-CM | POA: Diagnosis present

## 2011-09-12 HISTORY — DX: Personal history of nicotine dependence: Z87.891

## 2011-09-12 MED ORDER — ALUM & MAG HYDROXIDE-SIMETH 200-200-20 MG/5ML PO SUSP: 30.0000 mL | ORAL | Status: DC | PRN

## 2011-09-12 MED ORDER — MAGNESIUM HYDROXIDE 400 MG/5ML PO SUSP: 30.0000 mL | Freq: Every day | ORAL | Status: DC | PRN

## 2011-09-12 MED ORDER — RIVAROXABAN 20 MG PO TABS
20.0000 mg | ORAL_TABLET | Freq: Every day | ORAL | Status: DC
  Administered 2011-09-13 – 2011-09-16 (×4): 20 mg via ORAL
  Filled 2011-09-12 (×6): qty 1

## 2011-09-12 MED ORDER — SENNOSIDES-DOCUSATE SODIUM 8.6-50 MG PO TABS
1.0000 | ORAL_TABLET | Freq: Every evening | ORAL | Status: DC | PRN
  Filled 2011-09-12: qty 1

## 2011-09-12 MED ORDER — ACETAMINOPHEN 325 MG PO TABS
650.0000 mg | ORAL_TABLET | Freq: Four times a day (QID) | ORAL | Status: DC | PRN
Start: 2011-09-12 — End: 2011-09-17
  Administered 2011-09-12 – 2011-09-16 (×6): 650 mg via ORAL

## 2011-09-12 MED ORDER — RIVAROXABAN 20 MG PO TABS
20.0000 mg | ORAL_TABLET | Freq: Every day | ORAL | Status: DC
  Administered 2011-09-12: 20 mg via ORAL
  Filled 2011-09-12: qty 1

## 2011-09-12 NOTE — ED Provider Notes (Signed)
Pt seen and evaluated in Pod C, no current complaints, resting comfortably.  Pt awaiting disposition per ACT team.   3:52 PM- pt accepted at [facility] for admission.   Ethelda Chick, MD 09/12/11 [phone]

## 2011-09-12 NOTE — ED Notes (Signed)
ACT team at bedside.  

## 2011-09-12 NOTE — ED Notes (Signed)
Received report from [place]. Pt is currently resting. Pt is calm and not in any distress. No cardiac or respiratory distress. Sitter at bedside. Pts room survey for any items that could potentially harm him. None found. Will continue to monitor.

## 2011-09-12 NOTE — ED Notes (Signed)
Report given to Pat RN

## 2011-09-12 NOTE — BH Assessment (Signed)
Assessment Note   [name] is an 46 y.o. male that presented on his own accord to address his ongoing and increasing depression and worsening substance use.  Pt reports using up to 300 dollars daily of Cocaine (last use 07/04am) and Cannibus 1-2x weekly (last use 07/04am).  Pt reports feeling unable to control his use, and decreasing his level of functioning.  Pt admits that this has caused increased depression and passive suicidal ideation.  Pt has been seen before at the [facility] for intensive outpatient treatment of depression and has been hospitalized at [facility] in 2010 for same.  Pt is not currently being treated with psychiatric medication or for his substance abuse use.   Pt is currently residing with his estranged wife and children and a cousin, which patient reports has been increasingly stressful.  Pt denies active SI, HI, or psychotic symptoms.  Pt is concerned about his ability to contract for safety.     Axis I: Mood Disorder NOS and Substance Abuse Axis II: Deferred Axis III:  Past Medical History  Diagnosis Date  . DVT (deep venous thrombosis) 01/08/2011    lt leg  . Hypertension   . DVT of lower limb, acute 01/12/2011  . BPH (benign prostatic hyperplasia)   . Current use of long term anticoagulation   . 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 Noncompliance w/medication treatment due to intermit use of medication    Axis IV: other psychosocial or environmental problems, problems related to social environment and problems with primary support group Axis V: 31-40 impairment in reality testing  Past Medical History:  Past Medical History  Diagnosis Date  . DVT (deep venous thrombosis) 01/08/2011    lt leg  . Hypertension   . DVT of lower limb, acute 01/12/2011  . BPH (benign prostatic hyperplasia)   . Current use of long term anticoagulation   . 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  Noncompliance w/medication treatment due to intermit use of medication     Past Surgical History  Procedure Date  . Tonsillectomy     Family History: History reviewed. No pertinent family history.  Social History:  reports that he quit smoking about a year ago. His smoking use included Cigars. He has never used smokeless tobacco. He reports that he drinks about 1.2 ounces of alcohol per week. He reports that he uses illicit drugs (Cocaine) about 3 times per week.  Additional Social History:  Alcohol / Drug Use Pain Medications: No Prescriptions: No Over the Counter: No History of alcohol / drug use?: Yes Substance #1 Name of Substance 1: Cocaine 1 - Age of First Use: 26 1 - Amount (size/oz): up to 300 dollars 1 - Frequency: 3-4x weekly 1 - Duration: months 1 - Last Use / Amount: 09/11/2011 Substance #2 Name of Substance 2: Cannibus 2 - Age of First Use: 17 2 - Amount (size/oz): 1-2 blunts 2 - Frequency: 1-2x monthly 2 - Duration: months 2 - Last Use / Amount: 09/11/2011  CIWA: CIWA-Ar BP: 106/61 mmHg Pulse Rate: 58  Nausea and Vomiting: no nausea and no vomiting Tactile Disturbances: none Tremor: no tremor Auditory Disturbances: not present Paroxysmal Sweats: no sweat visible Visual Disturbances: not present Anxiety: two Headache, Fullness in Head: none present Agitation: normal activity Orientation and Clouding of Sensorium: oriented and can do serial additions CIWA-Ar Total: 2  COWS:    Allergies: No Known  Allergies  Home Medications:  (Not in a hospital admission)  OB/GYN Status:  No LMP for male patient.  General Assessment Data Location of Assessment: [facility] ED Living Arrangements: Spouse/significant other;Other relatives Can pt return to current living arrangement?: Yes Admission Status: Voluntary Is patient capable of signing voluntary admission?: Yes Transfer from: Acute Hospital Referral Source: MD  Education Status Is patient currently in  school?: No Highest grade of school patient has completed: some college  Risk to self Suicidal Ideation: Yes-Currently Present Suicidal Intent: No Is patient at risk for suicide?: Yes Suicidal Plan?: No Access to Means: No What has been your use of drugs/alcohol within the last 12 months?: Cocaine and Cannibus Previous Attempts/Gestures: No How many times?: 0  Other Self Harm Risks: destructive/impulsive/reckless Triggers for Past Attempts: Unpredictable Intentional Self Injurious Behavior: Damaging Comment - Self Injurious Behavior: binging on Cocaine when able Family Suicide History: No Recent stressful life event(s): Conflict (Comment);Financial Problems Persecutory voices/beliefs?: No Depression: Yes Depression Symptoms: Despondent;Fatigue;Guilt;Loss of interest in usual pleasures;Feeling worthless/self pity Substance abuse history and/or treatment for substance abuse?: Yes Suicide prevention information given to non-admitted patients: Not applicable  Risk to Others Homicidal Ideation: No Thoughts of Harm to Others: No Current Homicidal Intent: No Current Homicidal Plan: No Access to Homicidal Means: No Identified Victim: n/a History of harm to others?: No Assessment of Violence: None Noted Violent Behavior Description: none noted Does patient have access to weapons?: No Criminal Charges Pending?: No Does patient have a court date: No  Psychosis Hallucinations: None noted Delusions: None noted  Mental Status Report Eye Contact: Fair Motor Activity: Unremarkable Speech: Logical/coherent Level of Consciousness: Quiet/awake Mood: Depressed;Empty;Helpless;Guilty;Sad Affect: Apathetic;Depressed;Sad;Preoccupied Anxiety Level: Moderate Thought Processes: Relevant Judgement: Impaired Orientation: Person;Place;Time;Situation Obsessive Compulsive Thoughts/Behaviors: Moderate  Cognitive Functioning Concentration: Normal Memory: Recent Intact;Remote Intact IQ:  Average Insight: Poor Impulse Control: Poor Appetite: Good Weight Loss: 0  Weight Gain: 0  Sleep: No Change Total Hours of Sleep: 6  Vegetative Symptoms: None  ADLScreening [facility]) Patient's cognitive ability adequate to safely complete daily activities?: Yes Patient able to express need for assistance with ADLs?: Yes Independently performs ADLs?: Yes  Abuse/Neglect [facility]) Physical Abuse: Denies Verbal Abuse: Denies Sexual Abuse: Denies  Prior Inpatient Therapy Prior Inpatient Therapy: Yes Prior Therapy Dates: 2010 Prior Therapy Facilty/Provider(s): [facility] Reason for Treatment: depression  Prior Outpatient Therapy Prior Outpatient Therapy: Yes Prior Therapy Dates: currently Prior Therapy Facilty/Provider(s): [facility] Reason for Treatment: SA/depression  ADL Screening (condition at time of admission) Patient's cognitive ability adequate to safely complete daily activities?: Yes Patient able to express need for assistance with ADLs?: Yes Independently performs ADLs?: Yes       Abuse/Neglect Assessment (Assessment to be complete while patient is alone) Physical Abuse: Denies Verbal Abuse: Denies Sexual Abuse: Denies Exploitation of patient/patient's resources: Denies Self-Neglect: Denies Values / Beliefs Cultural Requests During Hospitalization: None Spiritual Requests During Hospitalization: None   Advance Directives (For Healthcare) Advance Directive: Patient does not have advance directive Nutrition Screen Diet: Regular (PT provided with nourishment)  Additional Information 1:1 In Past 12 Months?: No CIRT Risk: No Elopement Risk: No Does patient have medical clearance?: Yes     Disposition:  Disposition Disposition of Patient: Referred to [facility])  On Site Evaluation by:   Reviewed with Physician:     Angelica Ran 09/12/2011 7:59 AM

## 2011-09-12 NOTE — BH Assessment (Signed)
BHH Assessment Progress Note      1430:  Pt has been accepted for inpatient treatment to Dr. Marlowe Shores.  Dr and nursing staff notified and agreeable with transfer.  All paperwork faxed to appropriate parties and transfer initiated.  Verdene Lennert Horrace Hanak, LPC

## 2011-09-13 ENCOUNTER — Encounter (HOSPITAL_COMMUNITY): Payer: Self-pay | Admitting: Psychiatry

## 2011-09-13 DIAGNOSIS — F1410 Cocaine abuse, uncomplicated: Secondary | ICD-10-CM | POA: Diagnosis present

## 2011-09-13 DIAGNOSIS — F1910 Other psychoactive substance abuse, uncomplicated: Secondary | ICD-10-CM

## 2011-09-13 DIAGNOSIS — F1994 Other psychoactive substance use, unspecified with psychoactive substance-induced mood disorder: Secondary | ICD-10-CM

## 2011-09-13 MED ORDER — ARIPIPRAZOLE 5 MG PO TABS
5.0000 mg | ORAL_TABLET | Freq: Every day | ORAL | Status: DC
  Administered 2011-09-13 – 2011-09-14 (×2): 5 mg via ORAL
  Filled 2011-09-13 (×4): qty 1

## 2011-09-13 MED ORDER — RIVAROXABAN 20 MG PO TABS: 20.0000 mg | ORAL_TABLET | Freq: Every day | ORAL | Status: DC

## 2011-09-13 MED ORDER — TUBERCULIN PPD 5 UNIT/0.1ML ID SOLN
5.0000 [IU] | Freq: Once | INTRADERMAL | Status: AC
  Administered 2011-09-13: 5 [IU] via INTRADERMAL

## 2011-09-13 NOTE — Progress Notes (Signed)
Psychoeducational Group Note  Date:  09/13/2011 Time:  1515  Group Topic/Focus:  Healthy Communication:   The focus of this group is to discuss communication, barriers to communication, as well as healthy ways to communicate with others.  Participation Level:  Active  Participation Quality:  Appropriate, Attentive, Sharing and Supportive  Affect:  Appropriate  Cognitive:  Appropriate  Insight:  Good  Engagement in Group:  Good  Additional Comments:  Pt identified unhealthy and healthy coping skills. Pt gave an example of negative and positive ways to communicate (gestures, words, tone of voice, and body language. Pt role play with unhealthy way and negative way to communicate. Pt was supportive, and shared information during group. Pt stated one way to communicate in a healthy way upon discharge is to be honest and not to hold important information from others.    Karleen Hampshire Brittini 09/13/2011, 6:57 PM

## 2011-09-13 NOTE — BHH Suicide Risk Assessment (Signed)
Suicide Risk Assessment  Admission Assessment     Demographic factors:    Current Mental Status:    Loss Factors:    Historical Factors:    Risk Reduction Factors:     CLINICAL FACTORS:   Depression:   Anhedonia Comorbid alcohol abuse/dependence Hopelessness Alcohol/Substance Abuse/Dependencies Chronic Pain Previous Psychiatric Diagnoses and Treatments  COGNITIVE FEATURES THAT CONTRIBUTE TO RISK:  Closed-mindedness Thought constriction (tunnel vision)    SUICIDE RISK:   Moderate:  Frequent suicidal ideation with limited intensity, and duration, some specificity in terms of plans, no associated intent, good self-control, limited dysphoria/symptomatology, some risk factors present, and identifiable protective factors, including available and accessible social support.  Reason for hospitalization: .Detox and get into treatment Diagnosis:   Axis I: Major Depression, Recurrent severe, Substance Induced Mood Disorder and Cocaine Dependence Axis II: Deferred Axis III:  Past Medical History  Diagnosis Date  . DVT (deep venous thrombosis) 01/08/2011    lt leg  . Hypertension   . DVT of lower limb, acute 01/12/2011  . BPH (benign prostatic hyperplasia)   . Current use of long term anticoagulation   . 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 Noncompliance w/medication treatment due to intermit use of medication   . History of tobacco use     QUIT early 2013   Axis IV: other psychosocial or environmental problems Axis V: 51-60 moderate symptoms  ADL's:  Intact  Sleep: Fair  Appetite:  Good  Suicidal Ideation:  Pt denies any thoughts, plans, intent of suicide Homicidal Ideation:  Pt denies any thoughts, plans, intent of homicide  Mental Status Examination/Evaluation: Objective:  Appearance: Casual  Eye Contact::  Good  Speech:  Clear and Coherent  Volume:  Normal  Mood:  Hopeless  Affect:  Congruent  Thought Process:   Coherent  Orientation:  Full  Thought Content:  Paranoid Ideation, even when clean has some paranoid notions.  Suicidal Thoughts:  Yes.  without intent/plan, had notion of suicide two days ago  Homicidal Thoughts:  No  Memory:  Immediate;   Fair Recent;   Fair Remote;   Fair  Judgement:  Impaired  Insight:  Fair  Psychomotor Activity:  Normal  Concentration:  Fair  Recall:  Fair  Akathisia:  No  Handed:  Right  AIMS (if indicated):     Assets:  Communication Skills Desire for Improvement  Sleep:  Number of Hours: 6.75    Vital Signs:Blood pressure 143/83, pulse 59, temperature 98.2 F (36.8 C), temperature source Oral, resp. rate 16. Current Medications: Current Facility-Administered Medications  Medication Dose Route Frequency Provider Last Rate Last Dose  . acetaminophen (TYLENOL) tablet 650 mg  650 mg Oral Q6H PRN Verne Spurr, PA-C   650 mg at 09/13/11 0858  . alum & mag hydroxide-simeth (MAALOX/MYLANTA) 200-200-20 MG/5ML suspension 30 mL  30 mL Oral Q4H PRN Verne Spurr, PA-C      . ARIPiprazole (ABILIFY) tablet 5 mg  5 mg Oral Daily Mickie D. Adams, PA   5 mg at 09/13/11 1207  . magnesium hydroxide (MILK OF MAGNESIA) suspension 30 mL  30 mL Oral Daily PRN Verne Spurr, PA-C      . Rivaroxaban (XARELTO) tablet 20 mg  20 mg Oral Daily Curlene Labrum Readling, MD   20 mg at 09/13/11 0858  . DISCONTD: Rivaroxaban (XARELTO) tablet 20 mg  20 mg Oral Daily Mickie D. Pernell Dupre, PA       Facility-Administered Medications Ordered in  Other Encounters  Medication Dose Route Frequency Provider Last Rate Last Dose  . DISCONTD: Rivaroxaban (XARELTO) tablet 20 mg  20 mg Oral Daily Ethelda Chick, MD   20 mg at 09/12/11 1612  . DISCONTD: senna-docusate (Senokot-S) tablet 1 tablet  1 tablet Oral QHS PRN Ethelda Chick, MD        Lab Results:  Results for orders placed during the hospital encounter of 09/11/11 (from the past 48 hour(s))  URINALYSIS, ROUTINE W REFLEX MICROSCOPIC     Status:  Abnormal   Collection Time   09/11/11  6:34 PM      Component Value Range Comment   Color, Urine YELLOW  YELLOW    APPearance CLEAR  CLEAR    Specific Gravity, Urine 1.026  1.005 - 1.030    pH 5.5  5.0 - 8.0    Glucose, UA NEGATIVE  NEGATIVE mg/dL    Hgb urine dipstick NEGATIVE  NEGATIVE    Bilirubin Urine NEGATIVE  NEGATIVE    Ketones, ur NEGATIVE  NEGATIVE mg/dL    Protein, ur NEGATIVE  NEGATIVE mg/dL    Urobilinogen, UA 0.2  0.0 - 1.0 mg/dL    Nitrite NEGATIVE  NEGATIVE    Leukocytes, UA TRACE (*) NEGATIVE   URINE RAPID DRUG SCREEN (HOSP PERFORMED)     Status: Abnormal   Collection Time   09/11/11  6:34 PM      Component Value Range Comment   Opiates NONE DETECTED  NONE DETECTED    Cocaine POSITIVE (*) NONE DETECTED    Benzodiazepines NONE DETECTED  NONE DETECTED    Amphetamines NONE DETECTED  NONE DETECTED    Tetrahydrocannabinol POSITIVE (*) NONE DETECTED    Barbiturates NONE DETECTED  NONE DETECTED   URINE MICROSCOPIC-ADD ON     Status: Normal   Collection Time   09/11/11  6:34 PM      Component Value Range Comment   Squamous Epithelial / LPF RARE  RARE    WBC, UA 0-2  <3 WBC/hpf   CBC WITH DIFFERENTIAL     Status: Abnormal   Collection Time   09/11/11  6:41 PM      Component Value Range Comment   WBC 6.7  4.0 - 10.5 K/uL    RBC 4.83  4.22 - 5.81 MIL/uL    Hemoglobin 16.4  13.0 - 17.0 g/dL    HCT 28.4  13.2 - 44.0 %    MCV 94.0  78.0 - 100.0 fL    MCH 34.0  26.0 - 34.0 pg    MCHC 36.1 (*) 30.0 - 36.0 g/dL    RDW 10.2  72.5 - 36.6 %    Platelets 250  150 - 400 K/uL    Neutrophils Relative 69  43 - 77 %    Neutro Abs 4.6  1.7 - 7.7 K/uL    Lymphocytes Relative 25  12 - 46 %    Lymphs Abs 1.6  0.7 - 4.0 K/uL    Monocytes Relative 6  3 - 12 %    Monocytes Absolute 0.4  0.1 - 1.0 K/uL    Eosinophils Relative 0  0 - 5 %    Eosinophils Absolute 0.0  0.0 - 0.7 K/uL    Basophils Relative 0  0 - 1 %    Basophils Absolute 0.0  0.0 - 0.1 K/uL   COMPREHENSIVE METABOLIC PANEL      Status: Abnormal   Collection Time   09/11/11  6:41 PM  Component Value Range Comment   Sodium 140  135 - 145 mEq/L    Potassium 4.2  3.5 - 5.1 mEq/L    Chloride 105  96 - 112 mEq/L    CO2 26  19 - 32 mEq/L    Glucose, Bld 117 (*) 70 - 99 mg/dL    BUN 16  6 - 23 mg/dL    Creatinine, Ser 1.61 (*) 0.50 - 1.35 mg/dL    Calcium 8.6  8.4 - 09.6 mg/dL    Total Protein 5.7 (*) 6.0 - 8.3 g/dL    Albumin 3.4 (*) 3.5 - 5.2 g/dL    AST 22  0 - 37 U/L    ALT 31  0 - 53 U/L    Alkaline Phosphatase 42  39 - 117 U/L    Total Bilirubin 0.2 (*) 0.3 - 1.2 mg/dL    GFR calc non Af Amer 54 (*) >90 mL/min    GFR calc Af Amer 63 (*) >90 mL/min   ETHANOL     Status: Normal   Collection Time   09/11/11  6:41 PM      Component Value Range Comment   Alcohol, Ethyl (B) <11  0 - 11 mg/dL     Physical Findings: AIMS: Facial and Oral Movements Muscles of Facial Expression: None, normal Lips and Perioral Area: None, normal Jaw: None, normal Tongue: None, normal,Extremity Movements Upper (arms, wrists, hands, fingers): None, normal Lower (legs, knees, ankles, toes): None, normal, Trunk Movements Neck, shoulders, hips: None, normal, Overall Severity Severity of abnormal movements (highest score from questions above): None, normal Incapacitation due to abnormal movements: None, normal Patient's awareness of abnormal movements (rate only patient's report): No Awareness, Dental Status Current problems with teeth and/or dentures?: No Does patient usually wear dentures?: No  CIWA:    COWS:     Risk: Risk of harm to self is elevated by his depression and addictions  Risk of harm to others is minimal in that he has not been involved in fights or had any legal charges filed on him.  Treatment Plan Summary: Daily contact with patient to assess and evaluate symptoms and progress in treatment Medication management No signs/symptoms of withdrawal from abusable substances. Mood/anxiety less than 3/10 where  the scale is 1 is the best and 10 is the worst  Plan: Admit, start Abilify for depression and mild paranoid notions.  Discussed the risks, benefits, and probable clinical course with and without treatment.  Pt is agreeable to the current course of treatment. We will continue on q. 15 checks the unit protocol. At this time there is no clinical indication for one-to-one observation as patient contract for safety and presents little risk to harm themself and others.  We will increase collateral information. I encourage patient to participate in group milieu therapy. Pt will be seen in treatment team soon for further treatment and appropriate discharge planning. Please see history and physical note for more detailed information ELOS: 3 to 5 days.   Virlee Stroschein 09/13/2011, 2:50 PM

## 2011-09-13 NOTE — Progress Notes (Signed)
Psychoeducational Group Note  Date:  09/13/2011 Time:  1015  Group Topic/Focus:  Identifying Needs:   The focus of this group is to help patients identify their personal needs that have been historically problematic and identify healthy behaviors to address their needs.  Participation Level:  Active  Participation Quality:  Appropriate  Affect:  Appropriate  Cognitive:  Appropriate  Insight:  Good  Engagement in Group:  Good  Additional Comments:    [name] A  

## 2011-09-13 NOTE — Progress Notes (Signed)
Psychoeducational Group Note  Date:  09/13/2011 Time:  2000  Group Topic/Focus:  AA group  Participation Level:  Active  Participation Quality:  Appropriate  Affect:  Appropriate  Cognitive:  Alert  Insight:  Good  Engagement in Group:  Good  Additional Comments:  Pt attended and participated in AA group.  Kaleen Odea R 09/13/2011, 12:46 AM

## 2011-09-13 NOTE — Progress Notes (Signed)
Patient ID: [name], male   DOB: [birth year], 46 y.o.   MRN: [id number]  Pt. attended and participated in aftercare planning group. Pt. verbally accepted information on suicide prevention, warning signs to look for with suicide and crisis line numbers to use. Pt. listed their current anxiety level as 0 and their current depression level as 10. Pt. shared that he is very depressed and is tired of being on drugs.

## 2011-09-13 NOTE — Progress Notes (Signed)
Patient ID: ETHELBERT THAIN, male   DOB: [birth year], 46 y.o.   MRN: [id number]   [facility] Notes:  (Counselor/Nursing/MHT/Case Management/Adjunct)  09/13/2011 1:15 PM  Type of Therapy:  Group Therapy, Dance/Movement Therapy   Participation Level:  Active  Participation Quality:  Appropriate  Affect:  Appropriate  Cognitive:  Appropriate  Insight:  Good  Engagement in Group:  Good  Engagement in Therapy:  Good  Modes of Intervention:  Clarification, Problem-solving, Role-play, Socialization and Support  Summary of Progress/Problems: Therapist and group members discussed self-sabotage and enabling. Therapist invited group members to draw a picture of a mountain which was a symbol of their struggle. Group members shared where the currently were on the mountain, obstacles on the mountain, and what was at the top of their mountain. At the beginning of group, therapist asked group members to share what they would like to take from the hospital. Pt. shared that he enjoyed the AA speaker and that the AA meeting reinforced the notion to never give-up. Pt. stated that the AA speaker reminded him that life could always be worse. Pt. shared that his mountain was very steep and very jagged. Pt. stated that he was underneath the mountain and couldn't even see the top.     Cassidi Long

## 2011-09-13 NOTE — H&P (Signed)
Medical/psychiatric screening examination/treatment/procedure(s) were performed by non-physician practitioner and as supervising physician I was immediately available for consultation/collaboration.  I have seen and examined this patient and agree the major elements of this evaluation.  

## 2011-09-13 NOTE — Progress Notes (Signed)
[name] is seen OOB [facility] on the 300 hall today. He has a flat, blunted affect. He takes his meds as ordered and he is cooperative, but he is somewhat resistant for accepting total responsibility for his problems. He is compliant with his meds, he attends his groups and is trying to process his problems and leartn healthier ways to cope. A He completed his self inventory and on it he wrote he denied SI in the past 24 hrs and stated his DC plan was to be " less depressed and no drugs". R  Safety is maintained and POC includes continuing to foster therapeutic alliance already established PD RN [facility]

## 2011-09-13 NOTE — Progress Notes (Signed)
Patient ID: RUSHI CHASEN, male   DOB: May 25, 1965, 46 y.o.   MRN: [id number] This 46 yo male was transferred to [facility] from [facility], voluntarily here for help with worsening depression and substance abuse.  Has been dealing with health issues of DVT, had clot in left leg earlier this year, now dealing with a clot to his right radial. Rt hand is warm, pink , with good sensation, but states does have times of sharp, shooting pains. Has hx of bilateral pulmonary emboli., order was obtained to continue xarelto which he had been taking PTA. Was given tylenol earlier for pain in rt wrist, states feels very depressed, has thoughts of self harm but no plan to act on them, agreed to tell staff, contracts for safety.  Currently denies withdrawal sx.  Was oriented to unit, rules, will continue to monitor for safety.

## 2011-09-13 NOTE — Progress Notes (Signed)
[facility] MD Progress Note  09/13/2011 2:37 PM  Diagnosis:   Axis I: Major Depression, Recurrent severe, Substance Induced Mood Disorder and Cocaine Dependence Axis II: Deferred Axis III:  Past Medical History  Diagnosis Date  . DVT (deep venous thrombosis) 01/08/2011    lt leg  . Hypertension   . DVT of lower limb, acute 01/12/2011  . BPH (benign prostatic hyperplasia)   . Current use of long term anticoagulation   . 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 Noncompliance w/medication treatment due to intermit use of medication   . History of tobacco use     QUIT early 2013   Axis IV: other psychosocial or environmental problems Axis V: 51-60 moderate symptoms  ADL's:  Intact  Sleep: Fair  Appetite:  Good  Suicidal Ideation:  Pt denies any thoughts, plans, intent of suicide Homicidal Ideation:  Pt denies any thoughts, plans, intent of homicide  Mental Status Examination/Evaluation: Objective:  Appearance: Casual  Eye Contact::  Good  Speech:  Clear and Coherent  Volume:  Normal  Mood:  Hopeless  Affect:  Congruent  Thought Process:  Coherent  Orientation:  Full  Thought Content:  Paranoid Ideation, even when clean has some paranoid notions.  Suicidal Thoughts:  Yes.  without intent/plan, had notion of suicide two days ago  Homicidal Thoughts:  No  Memory:  Immediate;   Fair Recent;   Fair Remote;   Fair  Judgement:  Impaired  Insight:  Fair  Psychomotor Activity:  Normal  Concentration:  Fair  Recall:  Fair  Akathisia:  No  Handed:  Right  AIMS (if indicated):     Assets:  Communication Skills Desire for Improvement  Sleep:  Number of Hours: 6.75    Vital Signs:Blood pressure 143/83, pulse 59, temperature 98.2 F (36.8 C), temperature source Oral, resp. rate 16. Current Medications: Current Facility-Administered Medications  Medication Dose Route Frequency Provider Last Rate Last Dose  . acetaminophen (TYLENOL)  tablet 650 mg  650 mg Oral Q6H PRN Verne Spurr, PA-C   650 mg at 09/13/11 0858  . alum & mag hydroxide-simeth (MAALOX/MYLANTA) 200-200-20 MG/5ML suspension 30 mL  30 mL Oral Q4H PRN Verne Spurr, PA-C      . ARIPiprazole (ABILIFY) tablet 5 mg  5 mg Oral Daily Mickie D. Adams, PA   5 mg at 09/13/11 1207  . magnesium hydroxide (MILK OF MAGNESIA) suspension 30 mL  30 mL Oral Daily PRN Verne Spurr, PA-C      . Rivaroxaban (XARELTO) tablet 20 mg  20 mg Oral Daily Curlene Labrum Readling, MD   20 mg at 09/13/11 0858  . DISCONTD: Rivaroxaban (XARELTO) tablet 20 mg  20 mg Oral Daily Mickie D. Pernell Dupre, PA       Facility-Administered Medications Ordered in Other Encounters  Medication Dose Route Frequency Provider Last Rate Last Dose  . DISCONTD: Rivaroxaban (XARELTO) tablet 20 mg  20 mg Oral Daily Ethelda Chick, MD   20 mg at 09/12/11 1612  . DISCONTD: senna-docusate (Senokot-S) tablet 1 tablet  1 tablet Oral QHS PRN Ethelda Chick, MD        Lab Results:  Results for orders placed during the hospital encounter of 09/11/11 (from the past 48 hour(s))  URINALYSIS, ROUTINE W REFLEX MICROSCOPIC     Status: Abnormal   Collection Time   09/11/11  6:34 PM      Component Value Range Comment   Color, Urine YELLOW  YELLOW    APPearance CLEAR  CLEAR    Specific Gravity, Urine 1.026  1.005 - 1.030    pH 5.5  5.0 - 8.0    Glucose, UA NEGATIVE  NEGATIVE mg/dL    Hgb urine dipstick NEGATIVE  NEGATIVE    Bilirubin Urine NEGATIVE  NEGATIVE    Ketones, ur NEGATIVE  NEGATIVE mg/dL    Protein, ur NEGATIVE  NEGATIVE mg/dL    Urobilinogen, UA 0.2  0.0 - 1.0 mg/dL    Nitrite NEGATIVE  NEGATIVE    Leukocytes, UA TRACE (*) NEGATIVE   URINE RAPID DRUG SCREEN (HOSP PERFORMED)     Status: Abnormal   Collection Time   09/11/11  6:34 PM      Component Value Range Comment   Opiates NONE DETECTED  NONE DETECTED    Cocaine POSITIVE (*) NONE DETECTED    Benzodiazepines NONE DETECTED  NONE DETECTED    Amphetamines NONE  DETECTED  NONE DETECTED    Tetrahydrocannabinol POSITIVE (*) NONE DETECTED    Barbiturates NONE DETECTED  NONE DETECTED   URINE MICROSCOPIC-ADD ON     Status: Normal   Collection Time   09/11/11  6:34 PM      Component Value Range Comment   Squamous Epithelial / LPF RARE  RARE    WBC, UA 0-2  <3 WBC/hpf   CBC WITH DIFFERENTIAL     Status: Abnormal   Collection Time   09/11/11  6:41 PM      Component Value Range Comment   WBC 6.7  4.0 - 10.5 K/uL    RBC 4.83  4.22 - 5.81 MIL/uL    Hemoglobin 16.4  13.0 - 17.0 g/dL    HCT 81.1  91.4 - 78.2 %    MCV 94.0  78.0 - 100.0 fL    MCH 34.0  26.0 - 34.0 pg    MCHC 36.1 (*) 30.0 - 36.0 g/dL    RDW 95.6  21.3 - 08.6 %    Platelets 250  150 - 400 K/uL    Neutrophils Relative 69  43 - 77 %    Neutro Abs 4.6  1.7 - 7.7 K/uL    Lymphocytes Relative 25  12 - 46 %    Lymphs Abs 1.6  0.7 - 4.0 K/uL    Monocytes Relative 6  3 - 12 %    Monocytes Absolute 0.4  0.1 - 1.0 K/uL    Eosinophils Relative 0  0 - 5 %    Eosinophils Absolute 0.0  0.0 - 0.7 K/uL    Basophils Relative 0  0 - 1 %    Basophils Absolute 0.0  0.0 - 0.1 K/uL   COMPREHENSIVE METABOLIC PANEL     Status: Abnormal   Collection Time   09/11/11  6:41 PM      Component Value Range Comment   Sodium 140  135 - 145 mEq/L    Potassium 4.2  3.5 - 5.1 mEq/L    Chloride 105  96 - 112 mEq/L    CO2 26  19 - 32 mEq/L    Glucose, Bld 117 (*) 70 - 99 mg/dL    BUN 16  6 - 23 mg/dL    Creatinine, Ser 5.78 (*) 0.50 - 1.35 mg/dL    Calcium 8.6  8.4 - 46.9 mg/dL    Total Protein 5.7 (*) 6.0 - 8.3 g/dL    Albumin 3.4 (*) 3.5 - 5.2 g/dL    AST 22  0 -  37 U/L    ALT 31  0 - 53 U/L    Alkaline Phosphatase 42  39 - 117 U/L    Total Bilirubin 0.2 (*) 0.3 - 1.2 mg/dL    GFR calc non Af Amer 54 (*) >90 mL/min    GFR calc Af Amer 63 (*) >90 mL/min   ETHANOL     Status: Normal   Collection Time   09/11/11  6:41 PM      Component Value Range Comment   Alcohol, Ethyl (B) <11  0 - 11 mg/dL     Physical  Findings: AIMS: Facial and Oral Movements Muscles of Facial Expression: None, normal Lips and Perioral Area: None, normal Jaw: None, normal Tongue: None, normal,Extremity Movements Upper (arms, wrists, hands, fingers): None, normal Lower (legs, knees, ankles, toes): None, normal, Trunk Movements Neck, shoulders, hips: None, normal, Overall Severity Severity of abnormal movements (highest score from questions above): None, normal Incapacitation due to abnormal movements: None, normal Patient's awareness of abnormal movements (rate only patient's report): No Awareness, Dental Status Current problems with teeth and/or dentures?: No Does patient usually wear dentures?: No  CIWA:    COWS:     Treatment Plan Summary: Daily contact with patient to assess and evaluate symptoms and progress in treatment Medication management No signs/symptoms of withdrawal from abusable substances. Mood/anxiety less than 3/10 where the scale is 1 is the best and 10 is the worst  Plan: Admit, start Abilify for depression and mild paranoid notions.  Discussed the risks, benefits, and probable clinical course with and without treatment.  Pt is agreeable to the current course of treatment.  Iyanah Demont 09/13/2011, 2:37 PM

## 2011-09-13 NOTE — H&P (Signed)
Psychiatric Admission Assessment Adult  Patient Identification:  [name] Date of Evaluation:  09/13/2011 45yo separated AAM CC: Uses cocaine regularly but presented to ED because of increasing depressive thoughts and occasional SI without a plan. History of Present Illness: Has been treated at the [place] in the IOP and would prefer inpatient but they require someone be clean and sober prior to admission. He is unable to control his SA and is not able to continue staying with his estranged wife children and a cousin. Has been prescribed antidepressants in the past but becomes non-compliant.   Past Psychiatric History: Here 10/29-11/05/2009 IOP at [facility]   Substance Abuse History:  Social History:    reports that he quit smoking about a year ago. His smoking use included Cigars. He has never used smokeless tobacco. He reports that he drinks about 1.2 ounces of alcohol per week. He reports that he uses illicit drugs (Cocaine) about 3 times per week.  Family Psych History:  Past Medical History:     Past Medical History  Diagnosis Date  . DVT (deep venous thrombosis) 01/08/2011    lt leg  . Hypertension   . DVT of lower limb, acute 01/12/2011  . BPH (benign prostatic hyperplasia)   . Current use of long term anticoagulation   . 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 Noncompliance w/medication treatment due to intermit use of medication   . History of tobacco use     QUIT early 2013       Past Surgical History  Procedure Date  . Tonsillectomy     Allergies: No Known Allergies  Current Medications:  Prior to Admission medications   Medication Sig Start Date End Date Taking? Authorizing Provider  Rivaroxaban (XARELTO) 20 MG TABS Take 20 mg by mouth daily. 07/25/11   Stephani Police, PA    Mental Status Examination/Evaluation: Objective:  Appearance: Fairly Groomed  Psychomotor Activity:  Normal  Eye Contact::  Good  Speech:   Normal Rate  Volume:  Normal  Mood: depressed   Affect:  Depressed  Thought Process: clear rational goal oriented -wants long term SA treatment   Orientation:  Full  Thought Content:  denies AVH/psychosis   Suicidal Thoughts:  No  Homicidal Thoughts:  No  Judgement:  Fair  Insight:  Fair    DIAGNOSIS:    AXIS I Substance Abuse and Substance Induced Mood Disorder  AXIS II Deferred  AXIS III See medical history.  AXIS IV economic problems, occupational problems, other psychosocial or environmental problems, problems related to social environment and problems with primary support group  AXIS V 51-60 moderate symptoms     Treatment Plan Summary: Admit for safety & stabilization  Start Abilify to help with depression  Continue Rivaroxaban for arterial embolism and thrombosis -R radial artery 07/22/2011  Agree with H&P from [facility]

## 2011-09-14 MED ORDER — ARIPIPRAZOLE 5 MG PO TABS: 5.0000 mg | ORAL_TABLET | Freq: Every day | ORAL | Status: DC

## 2011-09-14 MED ORDER — ARIPIPRAZOLE 5 MG PO TABS
5.0000 mg | ORAL_TABLET | Freq: Every day | ORAL | Status: DC
  Filled 2011-09-14 (×3): qty 1

## 2011-09-14 NOTE — Progress Notes (Signed)
Psychoeducational Group Note  Date:  09/14/2011 Time:  0930  Group Topic/Focus:  Spirituality:   The focus of this group is to discuss how one's spirituality can aide in recovery.  Participation Level:  Active  Participation Quality:  Appropriate, Attentive, Sharing and Supportive  Affect:  Appropriate and Depressed  Cognitive:  Alert and Appropriate  Insight:  Good  Engagement in Group:  Good  Additional Comments:  Pt attended and participated in group. Pt discussed how the spiritual handout "Ending the Cycle" made him think about the past and he discussed how his relationship with his wife has made it hard to make changes in his life in the past few years.   Dalia Heading 09/14/2011, 11:12 AM

## 2011-09-14 NOTE — Progress Notes (Signed)
[facility] Adult Inpatient Family/Significant Other Suicide Prevention Education  Suicide Prevention Education:  Education Completed; Laurens Matheny [phone] C [phone] H),   has been identified by the patient as the family member/significant other with whom the patient will be residing, and identified as the person(s) who will aid the patient in the event of a mental health crisis (suicidal ideations/suicide attempt).  With written consent from the patient, the family member/significant other has been provided the following suicide prevention education, prior to the and/or following the discharge of the patient.  The suicide prevention education provided includes the following:  Suicide risk factors  Suicide prevention and interventions  National Suicide Hotline telephone number  [facility] assessment telephone number  [facility] Emergency Assistance 911  [facility] and/or Residential Mobile Crisis Unit telephone number  Request made of family/significant other to:  Remove weapons (e.g., guns, rifles, knives), all items previously/currently identified as safety concern.    Remove drugs/medications (over-the-counter, prescriptions, illicit drugs), all items previously/currently identified as a safety concern.  The family member/significant other verbalizes understanding of the suicide prevention education information provided.  The family member/significant other agrees to remove the items of safety concern listed above.  Father states that he would [facility].  [facility] 09/14/2011, 4:59 PM

## 2011-09-14 NOTE — Progress Notes (Signed)
Patient ID: [name], male   DOB: Dec 04, 1965, 46 y.o.   MRN: [id number] Was up and about earlier this evening and attended group, but has been tired, went to bed after group and has been sleeping.  Was awakened to evaluate his wrist, and hand, is warm with good sensation, denies pain at this time.  Rated his depression and hopelessness at 4 today.  Up briefly and back to bed.  Will continue to monitor.
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
## 2011-09-14 NOTE — Progress Notes (Signed)
Patient ID: [name], male   DOB: 30-Jun-1965, 46 y.o.   MRN: [id number] Pt. attended and participated in aftercare planning group. Pt. verbally accepted information on suicide prevention, warning signs to look for with suicide and crisis line numbers to use. The pt. agreed to call crisis line numbers if having warning signs or having thoughts of suicide. Pt. listed their current anxiety and depression levels as low.

## 2011-09-14 NOTE — Progress Notes (Signed)
D  Selassie presents to the med window this morning. He makes good eye cotnact. HE smiles pleasantly. He states he slept " ok" but that the " new medicine yesterday..it made me feel awful". He is bright-eyed. He states he received 2 tylenol earlier this morning and that is made the headache " go away".  A He completes his self inventory and on it he writes his depression and hopelessness are a " 7 / 7 ", he denies SI in the past 24 hrs and he did not comment on his DC plan. R  Safety is maintained and POC includes continuing to foster therapeutic alliance and help pt establsih healtheir coping mechanisms. PD RN [facility]

## 2011-09-14 NOTE — Progress Notes (Signed)
Patient ID: JONATHEN RATHMAN, male   DOB: 1965/06/26, 46 y.o.   MRN: [id number]  [facility] Group Notes:  (Counselor/Nursing/MHT/Case Management/Adjunct)  09/14/2011 1:15 PM  Type of Therapy:  Group Therapy, Dance/Movement Therapy   Participation Level:  Minimal  Participation Quality:  Appropriate and Drowsy  Affect:  Appropriate  Cognitive:  Appropriate  Insight:  Limited  Engagement in Group:  None  Engagement in Therapy:  None  Modes of Intervention:  Clarification, Problem-solving, Role-play, Socialization and Support  Summary of Progress/Problems:Therapist discussed the topic of support.  Therapist asked group what is your  definition of support and who are the supporters in their lives? Therapist asked group what is wanted from our supporters? Group discussed knowing the difference between positive and negative supports.  Pt. stated, "My dad has always been my supporter.."  Additionally, therapist discussed understanding the needs and of the supporter and how to prepare yourself when you feel you have no support.          Rhunette Croft

## 2011-09-14 NOTE — Progress Notes (Signed)
  [name] is a 46 y.o. male [id number] 1965/11/04  09/12/2011 Principal Problem:  *Cocaine abuse, unspecified   Mental Status: Mood is calmer denies SI/HI/AVH.    Subjective/Objective: Slept yesterday after the  Abilify. Today he says it makes him feel weird and tired. Will switch it to pm.     Filed Vitals:   09/14/11 1101  BP: 166/104  Pulse: 60  Temp:   Resp:     Lab Results:   BMET    Component Value Date/Time   NA 140 09/11/2011 1841   K 4.2 09/11/2011 1841   CL 105 09/11/2011 1841   CO2 26 09/11/2011 1841   GLUCOSE 117* 09/11/2011 1841   BUN 16 09/11/2011 1841   CREATININE 1.50* 09/11/2011 1841   CALCIUM 8.6 09/11/2011 1841   GFRNONAA 54* 09/11/2011 1841   GFRAA 63* 09/11/2011 1841    Medications:  Scheduled:     . ARIPiprazole  5 mg Oral QHS  . rivaroxaban  20 mg Oral Daily  . tuberculin  5 Units Intradermal Once  . DISCONTD: ARIPiprazole  5 mg Oral Daily     PRN Meds acetaminophen, alum & mag hydroxide-simeth, magnesium hydroxide  Plan: Switch Abilify to HS can get a second dose today.   Maricella Filyaw,MICKIE D. 09/14/2011

## 2011-09-14 NOTE — Progress Notes (Signed)
Patient ID: [name], male   DOB: [birth year], 46 y.o.   MRN: [id number] D) Has been sleeping most of this shift, was awakened for group but felt too tired to go, went back to sleep.  Awakened later for hs dose of abilify, he refused it stating he had an earlier dose and would wait until tomorrow to take it again as it was making him feel so tired, turned over and went back to sleep.  Rt hand remains warm and has good sensation, some mild discomfort in area of his thumb, ulnar pulse palpable but unable to palpate radial pulse.  Refused anything for pain, would let staff know if it worsened.  A) continue POC, encourage group attendance and med compliance, plan for discharge..  Will continue to monitor for safety.  R) Receptive

## 2011-09-15 DIAGNOSIS — F1410 Cocaine abuse, uncomplicated: Secondary | ICD-10-CM

## 2011-09-15 MED ORDER — TRAZODONE HCL 50 MG PO TABS
50.0000 mg | ORAL_TABLET | Freq: Every day | ORAL | Status: DC
  Administered 2011-09-15: 50 mg via ORAL
  Filled 2011-09-15 (×3): qty 1

## 2011-09-15 NOTE — Progress Notes (Signed)
[name] is seen out in the milieu this morning. He is pleasant. Makes good eye contact. Is taking his meds as ordered. Attending his groups as planned. He states he reused extra dose of Abilify that was ordered last night " because I was already knocked out".  A He completed his self inventory and on it he wrote he denied SI within the past 24 hrs, and he rates his depression and hopelessness " 7 / 7 ". R Safety is maintained and therapeutic relationship is fostered. PD RN [facility]

## 2011-09-15 NOTE — Progress Notes (Signed)
Pt attended group and was active and appropriate. Pt played the card game: apple to apples with staff and other pts. 

## 2011-09-15 NOTE — Progress Notes (Signed)
Discussed pt care with physician extender.  Agree with A/P.  See orders.

## 2011-09-15 NOTE — Progress Notes (Signed)
[facility] MD Progress Note  09/15/2011 5:00 PM  S: "I am doing alright. But still depressed about my cocaine use. I use cocaine just to get a hit. When I use and felt the ringing in the ear plus the high I get from it, I know this use is a good one. The high is what is making me and any other drug user seek to use again and again. When the high wears off, you find yourself depressed that you start to look to get high again. I have been in several bad places like drug houses, empty buildings, bad street corners and drug parties. I feel ashamed of what I am doing because I can't come home after using and look at my children in the eye and tell them right from wrong because I was not doing right to begin with. I'm here to find away to get better and stay clean. Being on drugs has caused me a lot of grief including family issues".  Diagnosis:   Axis I: Cocaine abuse, unspecified Axis II: Deferred Axis III:  Past Medical History  Diagnosis Date  . DVT (deep venous thrombosis) 01/08/2011    lt leg  . Hypertension   . DVT of lower limb, acute 01/12/2011  . BPH (benign prostatic hyperplasia)   . Current use of long term anticoagulation   . 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 Noncompliance w/medication treatment due to intermit use of medication   . History of tobacco use     QUIT early 2013   Axis IV: other psychosocial or environmental problems Axis V: 51-60 moderate symptoms  ADL's:  Intact  Sleep: Good  Appetite:  Good  Suicidal Ideation:  Plan:  No Intent:  No Means:  No Homicidal Ideation:  Plan:  No Intent:  No Means:  No  AEB (as evidenced by): Per patient's report  Mental Status Examination/Evaluation: Objective:  Appearance: Casual  Eye Contact::  Good  Speech:  Clear and Coherent  Volume:  Normal  Mood:  Depressed  Affect:  Flat  Thought Process:  Coherent  Orientation:  Full  Thought Content:  Rumination  Suicidal  Thoughts:  No  Homicidal Thoughts:  No  Memory:  Immediate;   Good Recent;   Good Remote;   Good  Judgement:  Fair  Insight:  Fair  Psychomotor Activity:  Normal  Concentration:  Good  Recall:  Good  Akathisia:  No  Handed:  Right  AIMS (if indicated):     Assets:  Desire for Improvement  Sleep:  Number of Hours: 5.75    Vital Signs:Blood pressure 138/98, pulse 73, temperature 98.2 F (36.8 C), temperature source Oral, resp. rate 18. Current Medications: Current Facility-Administered Medications  Medication Dose Route Frequency Provider Last Rate Last Dose  . acetaminophen (TYLENOL) tablet 650 mg  650 mg Oral Q6H PRN Verne Spurr, PA-C   650 mg at 09/15/11 0314  . alum & mag hydroxide-simeth (MAALOX/MYLANTA) 200-200-20 MG/5ML suspension 30 mL  30 mL Oral Q4H PRN Verne Spurr, PA-C      . magnesium hydroxide (MILK OF MAGNESIA) suspension 30 mL  30 mL Oral Daily PRN Verne Spurr, PA-C      . Rivaroxaban (XARELTO) tablet 20 mg  20 mg Oral Daily Curlene Labrum Readling, MD   20 mg at 09/15/11 0805  . traZODone (DESYREL) tablet 50 mg  50 mg Oral QHS Alyson Kuroski-Mazzei, DO      . DISCONTD:  ARIPiprazole (ABILIFY) tablet 5 mg  5 mg Oral QHS Mickie D. Adams, PA      . DISCONTD: ARIPiprazole (ABILIFY) tablet 5 mg  5 mg Oral QHS Alyson Kuroski-Mazzei, DO        Lab Results: No results found for this or any previous visit (from the past 48 hour(s)).  Physical Findings: AIMS: Facial and Oral Movements Muscles of Facial Expression: None, normal Lips and Perioral Area: None, normal Jaw: None, normal Tongue: None, normal,Extremity Movements Upper (arms, wrists, hands, fingers): None, normal Lower (legs, knees, ankles, toes): None, normal, Trunk Movements Neck, shoulders, hips: None, normal, Overall Severity Severity of abnormal movements (highest score from questions above): None, normal Incapacitation due to abnormal movements: None, normal Patient's awareness of abnormal movements (rate  only patient's report): No Awareness, Dental Status Current problems with teeth and/or dentures?: No Does patient usually wear dentures?: No  CIWA:  CIWA-Ar Total: 0  COWS:     Treatment Plan Summary: Daily contact with patient to assess and evaluate symptoms and progress in treatment Medication management  Plan: No new changes made on the treatment regimen. Continue current treatment plan.  Armandina Stammer I 09/15/2011, 5:00 PM

## 2011-09-15 NOTE — Progress Notes (Signed)
Pt attended discharge planning group and actively participated in group.  SW provided pt with today's workbook.  Pt presents with flat affect and depressed mood.  Pt was open with sharing reason for entering the hospital.  Pt states that he has been using cocaine for 17 years.  Pt states that he has tried to stop using on his own many times but relapsed when a stressful event happens.  Pt states that he has been to treatment once before. Pt is open to long term treatment.  Pt states that he stays with a cousin in [place].  Pt rates depression at a 7 and anxiety at a 6 today.  Pt denies SI.  SW will assess for appropriate referrals.  No further needs voiced by pt at this time.  Safety planning and suicide prevention discussed.  Pt participated in discussion and acknowledged an understanding of the information provided.       Reyes Ivan, LCSWA 09/15/2011  11:24 AM

## 2011-09-16 LAB — GLUCOSE, CAPILLARY: Glucose-Capillary: 160 mg/dL — ABNORMAL HIGH (ref 70–99)

## 2011-09-16 NOTE — Progress Notes (Signed)
[facility] Group Notes: (Counselor/Nursing/MHT/Case Management/Adjunct)    09/16/2011 8:09 AM   Type of Therapy: Group Therapy 1:15 to 2:30 on 09/15/2011   Participation Level: Active   Participation Quality: Appropriate, Attentive and Sharing   Affect: Appropriate   Cognitive: Appropriate   Insight: Good   Engagement in Group: Good   Engagement in Therapy: Good   Modes of Intervention: Clarification, Orientation, Socialization and Support   Summary of Progress/Problems: Group discussion focused on what patient's see as their own obstacles to recovery. Patient shares belief that "recovery is difficult to deal with because it so very different and sometimes I just don't know what to do with myself sober." Others agreed with Caryn Bee. Caryn Bee agreed with statement that it may indeed be more difficult to continue to use that to get clean. "I think of my life and know I can do better." Patient received support from majority of group.    [name]

## 2011-09-16 NOTE — Progress Notes (Signed)
09/16/2011         Time: 1500      Group Topic/Focus: The focus of this group is on discussing various aspects of wellness, balancing those aspects and exploring ways to increase the ability to experience wellness.   Participation Level: Active  Participation Quality: Appropriate and Attentive  Affect: Appropriate  Cognitive: Oriented   Additional Comments: None.    [name] 09/16/2011 3:42 PM 

## 2011-09-16 NOTE — Progress Notes (Signed)
[facility] Group Notes:  (Counselor/Nursing/MHT/Case Management/Adjunct)  09/16/2011 1:56 PM  Type of Therapy:  Psychoeducational Skills  Participation Level:  Did Not Attend   Summary of Progress/Problems: [name] did not attend Psychoeducational group that focused on using quality time with support systems/individuals to engage in healthly coping skills d/t c/o feeling sick.    Wandra Scot 09/16/2011, 1:56 PM

## 2011-09-16 NOTE — Progress Notes (Addendum)
Pt did not attend d/c planning group on this date.  SW met with pt individually at this time.  Pt presents with drowsy affect and mood.  Pt was resting in his room.  Pt states that he feels out of it and is unsteady on his feet.  Pt states that he thinks it was the Trazodone which he started last night.  Yesterday, SW discussed treatment options with pt.  Pt decided to go to [facility]; bed available tomorrow.  Provided pt with a bus pass to get to [facility].  Pt is scheduled to discharge Wednesday morning with enough time to get there by 8:00 am.  No further needs voiced by pt at this time.    Reyes Ivan, LCSWA 09/16/2011  10:08 AM

## 2011-09-16 NOTE — Progress Notes (Signed)
D:  Patient admitted with alcohol abuse.  He was not feeling well this morning, stating that the Trazadone he had last night made him feel groggy and "weird".  He did not attend groups this morning because he was feeling unwell. This afternoon, he has been interacting with staff and other patients.   A:  Administered medication as ordered.  Provided emotional support.  Safety checks q 15 minutes. R:  Safety maintained on unit.

## 2011-09-16 NOTE — Progress Notes (Signed)
[facility] Group Notes:  (Counselor/Nursing/MHT/Case Management/Adjunct)  09/16/2011 6:41 PM  Type of Therapy:  Group Therapy from 1:15 to 2:30  Participation Level:  Minimal  Participation Quality:  Appropriate and Attentive  Affect:  Appropriate  Cognitive:  Alert and Oriented  Insight:  None shared  Engagement in Group:  Limited  Engagement in Therapy:  Unknown  Modes of Intervention:  Socialization and Support  Summary of Progress/Problems:Patient missed portion of group with presentation by Interior and spatial designer of  [employer] ([employer]). [name]  was attentive and appropriate during processing portion of session.     [name] 09/16/2011, 6:44 PM

## 2011-09-16 NOTE — Progress Notes (Signed)
Patient ID: [name], male   DOB: Aug 15, 1965, 46 y.o.   MRN: [id number] D: Pt. In bed, resting, no distress noted. A: monitor for safety q65min. R: Pt. Is safe on the unit, resp. Even, unlabored.

## 2011-09-16 NOTE — Tx Team (Signed)
Interdisciplinary Treatment Plan Update (Adult)  Date:  09/16/2011  Time Reviewed:  9:51 AM   Progress in Treatment: Attending groups: Yes Participating in groups:  Yes Taking medication as prescribed: Yes Tolerating medication:  Yes Family/Significant othe contact made:  No, pt refused Patient understands diagnosis:  Yes Discussing patient identified problems/goals with staff:  Yes Medical problems stabilized or resolved:  Yes Denies suicidal/homicidal ideation: Yes Issues/concerns per patient self-inventory:  None identified Other: N/A  New problem(s) identified: None Identified  Reason for Continuation of Hospitalization: Anxiety Depression Medication stabilization Withdrawal symptoms  Interventions implemented related to continuation of hospitalization: mood stabilization, medication monitoring and adjustment, group therapy and psycho education, safety checks q 15 mins  Additional comments: N/A  Estimated length of stay: 1 day  Discharge Plan: Pt is scheduled to go to [facility] tomorrow for further SA treatment  New goal(s): N/A  Review of initial/current patient goals per problem list:    1.  Goal(s): Address substance use  Met:  No  Target date: by discharge  As evidenced by: completing detox protocol and refer to appropriate treatment  2.  Goal (s): Reduce depressive and anxiety symptoms  Met:  No  Target date: by discharge  As evidenced by: Reducing depression from a 10 to a 3 as reported by pt.     3.  Goal (s): Eliminate SI  Met:  No  Target date: by discharge  As evidenced by: Pt denying SI  Attendees: Patient:  [name] 09/16/2011 9:52 AM   Family:     Physician:  Lupe Carney, DO 09/16/2011 9:51 AM   Nursing: Roswell Miners, RN 09/16/2011 9:51 AM   Case Manager:  Reyes Ivan, LCSWA 09/16/2011  9:51 AM   Counselor:  Ronda Fairly, LCSWA 09/16/2011  9:51 AM   Other:  Richelle Ito, LCSW 09/16/2011 9:51 AM   Other:  Robbie Louis, RN 09/16/2011 9:52 AM   Other:  Chinita Greenland, RN 09/16/2011 9:53 AM   Other:      Scribe for Treatment Team:   Reyes Ivan 09/16/2011 9:51 AM

## 2011-09-16 NOTE — [facility] Suicide Risk Assessment (Signed)
Suicide Risk Assessment  Discharge Assessment      Demographic factors: See chart.  Current Mental Status Per Nursing Assessment At Discharge:  Pt denied any SI/HI/thoughts of self harm or acute psychiatric issues in treatment team with clinical, nursing and medical team present.  Current Mental Status Per Physician: Patient seen and evaluated. Chart reviewed. Patient stated that his mood was "good". His affect was mood congruent and euthymic. He was excited about being accepted to [facility] residential Tx program. He denied any current thoughts of self injurious behavior, suicidal ideation or homicidal ideation. He denied any significant depressive signs or symptoms at this time. There were no auditory or visual hallucinations, paranoia, delusional thought processes, or mania noted.  Thought process was linear and goal directed.  No psychomotor agitation or retardation was noted. His speech was normal rate, tone and volume. Eye contact was good. Judgment and insight are fair.  Patient has been up and engaged on the unit.  No acute safety concerns reported from team.  Loss Factors: loss of relationships  Historical Factors: hx medication noncompliance and multiple relapses; episodic SI in past w/o plan  Risk Reduction Factors: motivated for Tx, including long term residential Tx at [facility]; has a friend who is driving him directly there form [facility]   Discharge Diagnoses: Cocaine Dependence; SIMD  Past Medical History  Diagnosis Date  . DVT (deep venous thrombosis) 01/08/2011    lt leg  . Hypertension   . DVT of lower limb, acute 01/12/2011  . BPH (benign prostatic hyperplasia)   . Current use of long term anticoagulation   . 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 Noncompliance w/medication treatment due to intermit use of medication   . History of tobacco use     QUIT early 2013    Cognitive Features That Contribute To Risk: none.  Suicide  Risk: Pt viewed as a chronic increased risk of harm to self in light of his past hx and risk factors.  No acute safety concerns noted since on the unit.  Pt contracting for safety and stable for transfer to [facility].  VS:  Filed Vitals:   09/16/11 1005  BP: 146/94  Pulse: 78  Temp: 98 F (36.7 C)  Resp: 18    Meds: Trazodone d/c 2/2 sedation.  . rivaroxaban  20 mg Oral Daily  . DISCONTD: traZODone  50 mg Oral QHS     Plan Of Care/Follow-up recommendations: Pt seen and evaluated in treatment team. Chart reviewed.  Pt stable for and requesting discharge to [facility]. Pt contracting for safety and does not currently meet [place] involuntary commitment criteria for continued hospitalization against his will.  Mental health treatment, further medication management and continued sobriety will mitigate against the increased risk of harm to self and/or others.  Discussed the importance of recovery further with pt, as well as, tools to move forward in a healthy & safe manner.  Pt agreeable with the plan.  Discussed with the team.  Please see orders, follow up appointments per AVS and full discharge summary to be completed by physician extender.  Recommend follow up with NA.  Diet: Regular.  Activity: As tolerated.     Lupe Carney 09/16/2011, 3:58 PM

## 2011-09-17 NOTE — Progress Notes (Signed)
Writer went over discharge instructions and pt acknowledged understanding. Stated, "I feel pretty good and hyped, ready to start a new life". Declined to ride the bus, and has a ride with his cousin. Pt had no questions or concerns at the time of discharge. Rated hopeless at a 4.  Pt denied SI, HI, A/V.

## 2011-09-17 NOTE — Progress Notes (Signed)
Pt laying in bed resting with eyes closed. Respirations even and unlabored. No distress noted.  

## 2011-09-18 NOTE — Progress Notes (Signed)
Patient Discharge Instructions:  After Visit Summary (AVS):   Faxed to:  09/18/2011 Psychiatric Admission Assessment Note:   Faxed to:  09/18/2011 Suicide Risk Assessment - Discharge Assessment:   Faxed to:  09/18/2011 Faxed/Sent to the Next Level Care provider:  09/18/2011  Faxed to [facility] @ [phone]  Wandra Scot, 09/18/2011, 6:05 PM

## 2011-10-08 ENCOUNTER — Emergency Department (HOSPITAL_BASED_OUTPATIENT_CLINIC_OR_DEPARTMENT_OTHER)
Admission: EM | Admit: 2011-10-08 | Discharge: 2011-10-08 | Disposition: A | Discharge status: Home / Self Care | Payer: Non-veteran care | Attending: Emergency Medicine | Admitting: Emergency Medicine

## 2011-10-08 ENCOUNTER — Emergency Department (HOSPITAL_BASED_OUTPATIENT_CLINIC_OR_DEPARTMENT_OTHER): Payer: Non-veteran care

## 2011-10-08 ENCOUNTER — Encounter (HOSPITAL_BASED_OUTPATIENT_CLINIC_OR_DEPARTMENT_OTHER): Payer: Self-pay | Admitting: *Deleted

## 2011-10-08 DIAGNOSIS — H9209 Otalgia, unspecified ear: Secondary | ICD-10-CM

## 2011-10-08 DIAGNOSIS — Z86718 Personal history of other venous thrombosis and embolism: Secondary | ICD-10-CM | POA: Insufficient documentation

## 2011-10-08 HISTORY — DX: Depression, unspecified: F32.A

## 2011-10-08 HISTORY — DX: Major depressive disorder, single episode, unspecified: F32.9

## 2011-10-08 LAB — RAPID URINE DRUG SCREEN, HOSP PERFORMED
Amphetamines: NOT DETECTED
Benzodiazepines: NOT DETECTED
Opiates: NOT DETECTED
Tetrahydrocannabinol: NOT DETECTED

## 2011-10-08 MED ORDER — ACETAMINOPHEN 500 MG PO TABS
ORAL_TABLET | ORAL | Status: AC
  Administered 2011-10-08: 1000 mg via ORAL
  Filled 2011-10-08: qty 2

## 2011-10-08 MED ORDER — ANTIPYRINE-BENZOCAINE 5.4-1.4 % OT SOLN
3.0000 [drp] | Freq: Once | OTIC | Status: AC
  Administered 2011-10-08: 4 [drp] via OTIC
  Filled 2011-10-08: qty 10

## 2011-10-08 MED ORDER — NAPROXEN 250 MG PO TABS
500.0000 mg | ORAL_TABLET | Freq: Once | ORAL | Status: DC
  Filled 2011-10-08: qty 2

## 2011-10-08 MED ORDER — ACETAMINOPHEN 500 MG PO TABS
1000.0000 mg | ORAL_TABLET | Freq: Once | ORAL | Status: AC
  Administered 2011-10-08: 1000 mg via ORAL

## 2011-10-08 MED ORDER — ANTIPYRINE-BENZOCAINE 5.4-1.4 % OT SOLN: 3.0000 [drp] | OTIC | Status: AC | PRN

## 2011-10-08 NOTE — ED Provider Notes (Signed)
History     CSN: [id number]  Arrival date & time 10/08/11  0919   First MD Initiated Contact with Patient 10/08/11 [phone]      Chief Complaint  Patient presents with  . 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 Has gotten worse over the past 3 days without injury. He describes a feeling of something crawling in the ear. Denies any change in hearing, fever, vomiting, chest pain or shortness of breath. Denies any trauma to the ear. He has not had it evaluated in the past. Seems to worsen with movement of jaw. Denies any dizziness or vertigo currently but has experienced some in the past.  The history is provided by the patient.    Past Medical History  Diagnosis Date  . DVT (deep venous thrombosis) 01/08/2011    lt leg  . DVT of lower limb, acute 01/12/2011  . BPH (benign prostatic hyperplasia)   . Current use of long term anticoagulation   . 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 Noncompliance w/medication treatment due to intermit use of medication   . History of tobacco use     QUIT early 2013  . Hypertension   . Depression     Past Surgical History  Procedure Date  . Tonsillectomy     No family history on file.  History  Substance Use Topics  . Smoking status: Former Smoker -- 6 years    Types: Cigars    Quit date: 09/14/2010  . Smokeless tobacco: Never Used  . Alcohol Use: 1.2 oz/week    2 Cans of beer per week      Review of Systems  Constitutional: Negative for fever and activity change.  HENT: Positive for ear pain. Negative for hearing loss, sore throat and ear discharge.   Respiratory: Negative for cough, chest tightness and shortness of breath.   Cardiovascular: Negative for chest pain.    Gastrointestinal: Negative for nausea, vomiting and abdominal pain.  Genitourinary: Negative for dysuria.  Musculoskeletal: Negative for back pain.  Skin: Negative for rash.  Neurological: Negative for dizziness, weakness and headaches.    Allergies  Review of patient's allergies indicates no known allergies.  Home Medications   Current Outpatient Rx  Name Route Sig Dispense Refill  . ACETAMINOPHEN ER 650 MG PO TBCR Oral Take 650 mg by mouth every 8 (eight) hours as needed.    . SULFAMETHOXAZOLE-TMP DS 800-160 MG PO TABS Oral Take 1 tablet by mouth 2 (two) times daily.    Marland Kitchen TAMSULOSIN HCL 0.4 MG PO CAPS Oral Take 0.4 mg by mouth daily after breakfast.    . RIVAROXABAN 20 MG PO TABS Oral Take 20 mg by mouth daily. 30 tablet 0    BP 142/76  Pulse 88  Temp 98.6 F (37 C) (Oral)  Resp 18  Ht 5\' 8"  (1.727 m)  Wt 200 lb (90.719 kg)  BMI 30.41 kg/m2  SpO2 98%  Physical Exam  Constitutional: He is oriented to person, place, and time. He appears well-developed and well-nourished. No distress.  HENT:  Head: Normocephalic and atraumatic.  Right Ear: External ear normal.  Left Ear: External ear normal.  Mouth/Throat: Oropharynx is clear and moist. No oropharyngeal exudate.       Tympanic membranes appear normal bilaterally. No pain to manipulation the pinna  or tragus. No mastoid pain  Eyes: Conjunctivae and EOM are normal. Pupils are equal, round, and reactive to light.  Neck: Normal range of motion. Neck supple.  Cardiovascular: Normal rate and normal heart sounds.   Pulmonary/Chest: Effort normal and breath sounds normal. No respiratory distress.  Abdominal: Soft. There is no tenderness. There is no rebound and no guarding.  Musculoskeletal: Normal range of motion. He exhibits no edema and no tenderness.  Neurological: He is alert and oriented to person, place, and time. No cranial nerve deficit.       5 Out of 5 strength throughout, cranial nerves III through XII intact, no  ataxia finger to nose, no nystagmus, visual fields full to confrontation  Skin: Skin is warm.    ED Course  Procedures (including critical care time)   Labs Reviewed  URINE RAPID DRUG SCREEN (HOSP PERFORMED)   Ct Head Wo Contrast  10/08/2011  *RADIOLOGY REPORT*  Clinical Data: Right ear pain  CT HEAD WITHOUT CONTRAST  Technique:  Contiguous axial images were obtained from the base of the skull through the vertex without contrast.  Comparison: CT 01/11/2011  Findings: Ventricle size is normal.  Negative for acute or chronic infarct.  Negative for hemorrhage or mass.  The brain appears normal.  Calvarium is intact.  Mastoid sinus and middle ear are clear bilaterally.  External auditory canal is normal bilaterally.  IMPRESSION: Normal  Original Report Authenticated By: Camelia Phenes, M.D.     No diagnosis found.    MDM  Acute on chronic otalgia without evidence of infection. Vital signs stable, no neurological deficits.  Auralgam for symptom control, ENT followup.  CT negative. Discomfort improved with eardrops.       Glynn Octave, MD 10/08/11 1044

## 2011-10-08 NOTE — ED Notes (Signed)
Patient states he has had right ear pain for the last one year.  States over the last 3 days the pain is worse.  Describes pain as feeling like something is crawling in his ear and has pain behind his ear.

## 2011-10-22 NOTE — Discharge Summary (Signed)
Physician Discharge Summary Note   Patient:  [name] is an 46 y.o., male MRN:  [id number] DOB:  Oct 15, 1965 Patient phone:  [phone] (home)  Patient address:   [address] [place] [address],   Date of Admission:  09/12/2011  Reason for Admission: see H&P.  Principal Problem:  *Cocaine abuse, unspecified  Discharge Diagnoses: Cocaine Dependence; SIMD  Past Medical History  Diagnosis Date  . DVT (deep venous thrombosis) 01/08/2011    lt leg  . DVT of lower limb, acute 01/12/2011  . BPH (benign prostatic hyperplasia)   . Current use of long term anticoagulation   . 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 Noncompliance w/medication treatment due to intermit use of medication   . History of tobacco use     QUIT early 2013  . Hypertension   . Depression     Level of Care:  [facility]  Hospital Course:  Pt admitted for crisis stabilization, detox and treatment. Pt attended all therapeutic groups, was active in his treatment planning process and agreed the current medication regimen for further stability during recovery.  There were no acute issues during treatment and he was open to further substance abuse Tx.  All labs were reviewed in great detail and medical/psychiatric needs were addressed.  No acute safety issues were noted on the unit. Medications were reviewed with pt and medication education was provided. Mental health treatment, medication management and continued sobriety will mitigate against any potential increased risk of harm to self and/or others.  Discussed the importance of recovery with pt, as well as, tools to move forward in a healthy & safe manner using the 12 Step Process.   Consults: none.  Significant Diagnostic Studies:  See labs.  Discharge Vitals:   Blood pressure 146/94, pulse 78, temperature 98 F (36.7 C), temperature source Oral, resp. rate 18.  Mental Status Exam: See Mental Status  Examination and Suicide Risk Assessment completed by Attending Physician prior to discharge.  Discharge destination:  [facility]  Is patient on multiple antipsychotic therapies at discharge:  No   Has Patient had three or more failed trials of antipsychotic monotherapy by history:  No.  Recommended Plan for Multiple Antipsychotic Therapies: Na.  Medication List  As of 10/22/2011 10:49 AM   TAKE these medications      Indication    Rivaroxaban 20 MG Tabs   Commonly known as: XARELTO   Take 20 mg by mouth daily.            Follow-up Information    Follow up with [facility]  on 09/17/2011. (Arrive there promptly at 8:00 am!!)    Contact information:   [address] [place], [place] [address] [phone]         Follow-up recommendations: Mental health treatment, medication management and continued sobriety will mitigate against the potential increased risk of harm to self and/or others.  Discussed the importance of recovery further with pt, as well as, tools to move forward in a healthy & safe manner.  Recommend follow up with NA.  Diet: Regular.  Activity: As tolerated.     Signed: Lupe Carney 10/22/2011, 10:49 AM

## 2012-02-28 ENCOUNTER — Encounter (HOSPITAL_COMMUNITY): Payer: Self-pay | Admitting: Family Medicine

## 2012-02-28 ENCOUNTER — Emergency Department (HOSPITAL_COMMUNITY)
Admission: EM | Admit: 2012-02-28 | Discharge: 2012-02-28 | Disposition: A | Discharge status: Home / Self Care | Payer: Non-veteran care | Attending: Emergency Medicine | Admitting: Emergency Medicine

## 2012-02-28 DIAGNOSIS — Z79899 Other long term (current) drug therapy: Secondary | ICD-10-CM | POA: Insufficient documentation

## 2012-02-28 DIAGNOSIS — L600 Ingrowing nail: Secondary | ICD-10-CM

## 2012-02-28 DIAGNOSIS — Z86718 Personal history of other venous thrombosis and embolism: Secondary | ICD-10-CM | POA: Insufficient documentation

## 2012-02-28 DIAGNOSIS — Z7901 Long term (current) use of anticoagulants: Secondary | ICD-10-CM | POA: Insufficient documentation

## 2012-02-28 DIAGNOSIS — Z87891 Personal history of nicotine dependence: Secondary | ICD-10-CM | POA: Insufficient documentation

## 2012-02-28 DIAGNOSIS — N400 Enlarged prostate without lower urinary tract symptoms: Secondary | ICD-10-CM | POA: Insufficient documentation

## 2012-02-28 DIAGNOSIS — I10 Essential (primary) hypertension: Secondary | ICD-10-CM | POA: Insufficient documentation

## 2012-02-28 DIAGNOSIS — Z8659 Personal history of other mental and behavioral disorders: Secondary | ICD-10-CM | POA: Insufficient documentation

## 2012-02-28 NOTE — ED Notes (Signed)
Per pt he is having issues on both feet with his toes. sts possible ingrown toenail or callous. sts severe pain an swelling. Denies injury or drainage from toe.

## 2012-02-28 NOTE — ED Notes (Signed)
Pt states understanding of discharge instructions 

## 2012-02-29 NOTE — ED Provider Notes (Signed)
Medical screening examination/treatment/procedure(s) were performed by non-physician practitioner and as supervising physician I was immediately available for consultation/collaboration.  Carleene Cooper III, MD 02/29/12 [phone]

## 2012-02-29 NOTE — ED Provider Notes (Signed)
History     CSN: [id number]  Arrival date & time 02/28/12  1821   First MD Initiated Contact with Patient 02/28/12 1956      Chief Complaint  Patient presents with  . Foot Pain    (Consider location/radiation/quality/duration/timing/severity/associated sxs/prior treatment) HPI Comments: This is a 46 year old male, who presents emergency department with chief complaint of ingrown toenail and callus on the toe. Patient states that the pain is 10 out of 10. He denies injury or drainage from the toes. He states that there are no alleviating factors. His pain is worsened with activity, and when his toes are touched. The symptoms have been ongoing for the past week. Patient denies headache, blurred vision, new hearing loss, sore throat, chest pain, shortness of breath, nausea, vomiting, diarrhea, constipation, dysuria, peripheral edema, back pain, numbness or tingling of the extremities.   The history is provided by the patient. No language interpreter was used.    Past Medical History  Diagnosis Date  . DVT (deep venous thrombosis) 01/08/2011    lt leg  . DVT of lower limb, acute 01/12/2011  . BPH (benign prostatic hyperplasia)   . Current use of long term anticoagulation   . 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 Noncompliance w/medication treatment due to intermit use of medication   . History of tobacco use     QUIT early 2013  . Hypertension   . Depression     Past Surgical History  Procedure Date  . Tonsillectomy     History reviewed. No pertinent family history.  History  Substance Use Topics  . Smoking status: Former Smoker -- 6 years    Types: Cigars    Quit date: 09/14/2010  . Smokeless tobacco: Never Used  . Alcohol Use: 1.2 oz/week    2 Cans of beer per week      Review of Systems  All other systems reviewed and are negative.    Allergies  Review of patient's allergies indicates no known  allergies.  Home Medications   Current Outpatient Rx  Name  Route  Sig  Dispense  Refill  . ASPIRIN-CAFFEINE 500-32.5 MG PO TABS   Oral   Take 1-3 tablets by mouth 4 (four) times daily as needed. For pain         . ADULT MULTIVITAMIN W/MINERALS CH   Oral   Take 1 tablet by mouth daily.         Marland Kitchen BACITRACIN-NEOMYCIN-POLYMYXIN OINTMENT TUBE   Topical   Apply 1 application topically 3 (three) times daily as needed. For foot pain         . RIVAROXABAN 20 MG PO TABS   Oral   Take 20 mg by mouth daily with supper.         . CORN/CALLUS REMOVER EX   Apply externally   Apply 1 application topically 2 (two) times daily as needed. For foot pain         . TAMSULOSIN HCL 0.4 MG PO CAPS   Oral   Take 0.4 mg by mouth daily after breakfast.           BP 139/69  Pulse 66  Temp 98.3 F (36.8 C) (Oral)  Resp 20  SpO2 96%  Physical Exam  Nursing note and vitals reviewed. Constitutional: He is oriented to person, place, and time. He appears well-developed and well-nourished.  HENT:  Head: Normocephalic and atraumatic.  Eyes: Conjunctivae normal and EOM are  normal.  Neck: Normal range of motion.  Cardiovascular: Normal rate.   Pulmonary/Chest: Effort normal.  Abdominal: He exhibits no distension.  Musculoskeletal: Normal range of motion.       Right second toe ingrown toenail, with mild erythema, no discharge. Left fourth toe callus, painful to the touch.  Neurological: He is alert and oriented to person, place, and time.  Skin: Skin is dry.  Psychiatric: He has a normal mood and affect. His behavior is normal. Judgment and thought content normal.    ED Course  Procedures (including critical care time)  Labs Reviewed - No data to display No results found.  Procedure-ingrown toenail removal  Digital block performed using 2% lidocaine without epinephrine, 3 mL of lidocaine was used, the digital block was successful, the toenail was then cut at a 45 angle freeing  the distal lateral aspect of the toenail, while keeping the root and intact. There was no bleeding. Patient tolerated the procedure well.    1. Ingrown toenail       MDM  46 year old male with ingrown toenail. Patient encouraged to followup with his primary care provider for worsening symptoms. No need for antibiotics, as there is no signs of infection. Patient understands and agrees with the plan. He is stable and ready for discharge.        Roxy Horseman, PA-C 02/29/12 [phone]

## 2012-03-13 ENCOUNTER — Encounter (HOSPITAL_COMMUNITY): Payer: Self-pay | Admitting: Emergency Medicine

## 2012-03-13 ENCOUNTER — Emergency Department (HOSPITAL_COMMUNITY)
Admission: EM | Admit: 2012-03-13 | Discharge: 2012-03-13 | Disposition: A | Discharge status: Home / Self Care | Payer: Non-veteran care | Attending: Emergency Medicine | Admitting: Emergency Medicine

## 2012-03-13 ENCOUNTER — Emergency Department (HOSPITAL_COMMUNITY): Payer: Non-veteran care

## 2012-03-13 DIAGNOSIS — R238 Other skin changes: Secondary | ICD-10-CM | POA: Insufficient documentation

## 2012-03-13 DIAGNOSIS — Z91199 Patient's noncompliance with other medical treatment and regimen due to unspecified reason: Secondary | ICD-10-CM | POA: Insufficient documentation

## 2012-03-13 DIAGNOSIS — I82409 Acute embolism and thrombosis of unspecified deep veins of unspecified lower extremity: Secondary | ICD-10-CM | POA: Insufficient documentation

## 2012-03-13 DIAGNOSIS — I10 Essential (primary) hypertension: Secondary | ICD-10-CM | POA: Insufficient documentation

## 2012-03-13 DIAGNOSIS — Z9119 Patient's noncompliance with other medical treatment and regimen: Secondary | ICD-10-CM | POA: Insufficient documentation

## 2012-03-13 DIAGNOSIS — M79672 Pain in left foot: Secondary | ICD-10-CM

## 2012-03-13 DIAGNOSIS — N400 Enlarged prostate without lower urinary tract symptoms: Secondary | ICD-10-CM | POA: Insufficient documentation

## 2012-03-13 DIAGNOSIS — M79609 Pain in unspecified limb: Secondary | ICD-10-CM | POA: Insufficient documentation

## 2012-03-13 DIAGNOSIS — Z87891 Personal history of nicotine dependence: Secondary | ICD-10-CM | POA: Insufficient documentation

## 2012-03-13 DIAGNOSIS — Z7901 Long term (current) use of anticoagulants: Secondary | ICD-10-CM | POA: Insufficient documentation

## 2012-03-13 DIAGNOSIS — Z86718 Personal history of other venous thrombosis and embolism: Secondary | ICD-10-CM | POA: Insufficient documentation

## 2012-03-13 LAB — D-DIMER, QUANTITATIVE: D-Dimer, Quant: 0.3 ug/mL-FEU (ref 0.00–0.48)

## 2012-03-13 NOTE — ED Notes (Signed)
Patient transported to X-ray 

## 2012-03-13 NOTE — ED Notes (Signed)
Pt currently complaining of pain on the left anterior foot. Pt states that he has soreness in his calf also. Left anterior foot is tender and warm touch. Trace edema noted in left foot.  Pt states he also has a callous between his 4th and 5th toe that he can't get rid of.

## 2012-03-13 NOTE — ED Notes (Signed)
Pt c/o pain and swelling to L foot, denies injury. Pt states he believes this to be blood clot. Pt has had clots in calf, wrist as well as PE's. PWD.

## 2012-03-13 NOTE — ED Provider Notes (Signed)
History     CSN: [id number]  Arrival date & time 03/13/12  0206   First MD Initiated Contact with Patient 03/13/12 [phone]      Chief Complaint  Patient presents with  . Foot Pain    (Consider location/radiation/quality/duration/timing/severity/associated sxs/prior treatment) HPI Comments: Patient with L dorsal foot pain for 2 days  Denies injury but states he has Hx of a clotting disorder and has had multiple clots and embolectomies.  Followed at the [facility] but has been seen this area as well He has not seen his PCP for this particular complaint Takes Xarelto 20 mg daily   Patient is a 47 y.o. male presenting with lower extremity pain. The history is provided by the patient.  Foot Pain This is a recurrent problem. The current episode started in the past 7 days. The problem occurs constantly. The problem has been unchanged. Pertinent negatives include no chest pain, chills, fever, joint swelling, nausea or rash.    Past Medical History  Diagnosis Date  . DVT (deep venous thrombosis) 01/08/2011    lt leg  . DVT of lower limb, acute 01/12/2011  . BPH (benign prostatic hyperplasia)   . Current use of long term anticoagulation   . 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 Noncompliance w/medication treatment due to intermit use of medication   . History of tobacco use     QUIT early 2013  . Hypertension   . Depression     Past Surgical History  Procedure Date  . Tonsillectomy     No family history on file.  History  Substance Use Topics  . Smoking status: Former Smoker -- 6 years    Types: Cigars    Quit date: 09/14/2010  . Smokeless tobacco: Never Used  . Alcohol Use: 1.2 oz/week    2 Cans of beer per week      Review of Systems  Constitutional: Negative for fever and chills.  HENT: Negative.   Eyes: Negative.   Respiratory: Negative for shortness of breath.   Cardiovascular: Negative for chest pain and leg swelling.    Gastrointestinal: Negative for nausea.  Musculoskeletal: Negative for joint swelling.  Skin: Positive for color change. Negative for rash and wound.    Allergies  Review of patient's allergies indicates no known allergies.  Home Medications   Current Outpatient Rx  Name  Route  Sig  Dispense  Refill  . ACETAMINOPHEN 500 MG PO TABS   Oral   Take 1,000 mg by mouth every 6 (six) hours as needed. pain         . ADULT MULTIVITAMIN W/MINERALS CH   Oral   Take 1 tablet by mouth daily.         Marland Kitchen BACITRACIN-NEOMYCIN-POLYMYXIN OINTMENT TUBE   Topical   Apply 1 application topically 3 (three) times daily as needed. For foot pain         . RIVAROXABAN 20 MG PO TABS   Oral   Take 20 mg by mouth daily with supper.         Marland Kitchen TAMSULOSIN HCL 0.4 MG PO CAPS   Oral   Take 0.4 mg by mouth daily after breakfast.           BP 130/71  Pulse 64  Temp 98.1 F (36.7 C) (Oral)  Resp 18  Ht 5\' 8"  (1.727 m)  Wt 190 lb (86.183 kg)  BMI 28.89 kg/m2  SpO2 97%  Physical Exam  Constitutional: He is oriented to person, place, and time. He appears well-developed and well-nourished.  HENT:  Head: Normocephalic.  Neck: Normal range of motion.  Cardiovascular: Normal rate.   Pulmonary/Chest: Effort normal.  Musculoskeletal: Normal range of motion. He exhibits tenderness. He exhibits no edema.       Feet:       Slightly reddened pain with movement of great toe no swelling or tnederness in ankle/calf  Neurological: He is alert and oriented to person, place, and time.    ED Course  Procedures (including critical care time)   Labs Reviewed  D-DIMER, QUANTITATIVE   Dg Foot Complete Left  03/13/2012  *RADIOLOGY REPORT*  Clinical Data: Pain and swelling at first metatarsal region, no known injury  LEFT FOOT - COMPLETE 3+ VIEW  Comparison: None  Findings: Osseous mineralization normal. Minimal degenerative changes of first MTP joint. Joint spaces otherwise preserved. Mild dorsal soft  tissue swelling at mid foot. No acute fracture, dislocation or bone destruction. Small to moderate sized plantar calcaneal spur.  IMPRESSION: Minimal degenerative changes first MTP joint. Calcaneal spurring. No acute abnormalities.   Original Report Authenticated By: Ulyses Southward, M.D.      1. Foot pain, left       MDM  Xray show arthritis in great toe, D Dimer is negative not indicating blood clot         Arman Filter, NP 03/13/12 0502

## 2012-03-17 NOTE — ED Provider Notes (Signed)
Medical screening examination/treatment/procedure(s) were performed by non-physician practitioner and as supervising physician I was immediately available for consultation/collaboration.  Brydon Spahr R. Mykle Pascua, MD 03/17/12 0004 

## 2012-03-23 ENCOUNTER — Emergency Department (HOSPITAL_COMMUNITY)
Admission: EM | Admit: 2012-03-23 | Discharge: 2012-03-23 | Disposition: A | Discharge status: Home / Self Care | Payer: Self-pay | Attending: Emergency Medicine | Admitting: Emergency Medicine

## 2012-03-23 ENCOUNTER — Emergency Department (HOSPITAL_COMMUNITY): Payer: Self-pay

## 2012-03-23 ENCOUNTER — Encounter: Payer: Self-pay | Admitting: Internal Medicine

## 2012-03-23 ENCOUNTER — Encounter (HOSPITAL_COMMUNITY): Payer: Self-pay | Admitting: *Deleted

## 2012-03-23 DIAGNOSIS — X58XXXA Exposure to other specified factors, initial encounter: Secondary | ICD-10-CM | POA: Insufficient documentation

## 2012-03-23 DIAGNOSIS — R079 Chest pain, unspecified: Secondary | ICD-10-CM | POA: Insufficient documentation

## 2012-03-23 DIAGNOSIS — I10 Essential (primary) hypertension: Secondary | ICD-10-CM | POA: Insufficient documentation

## 2012-03-23 DIAGNOSIS — Z86711 Personal history of pulmonary embolism: Secondary | ICD-10-CM

## 2012-03-23 DIAGNOSIS — Z8659 Personal history of other mental and behavioral disorders: Secondary | ICD-10-CM | POA: Insufficient documentation

## 2012-03-23 DIAGNOSIS — Z91148 Patient's other noncompliance with medication regimen for other reason: Secondary | ICD-10-CM

## 2012-03-23 DIAGNOSIS — Z9114 Patient's other noncompliance with medication regimen: Secondary | ICD-10-CM

## 2012-03-23 DIAGNOSIS — Y929 Unspecified place or not applicable: Secondary | ICD-10-CM | POA: Insufficient documentation

## 2012-03-23 DIAGNOSIS — Z7901 Long term (current) use of anticoagulants: Secondary | ICD-10-CM | POA: Insufficient documentation

## 2012-03-23 DIAGNOSIS — Z79899 Other long term (current) drug therapy: Secondary | ICD-10-CM | POA: Insufficient documentation

## 2012-03-23 DIAGNOSIS — Z91199 Patient's noncompliance with other medical treatment and regimen due to unspecified reason: Secondary | ICD-10-CM | POA: Insufficient documentation

## 2012-03-23 DIAGNOSIS — R6883 Chills (without fever): Secondary | ICD-10-CM | POA: Insufficient documentation

## 2012-03-23 DIAGNOSIS — Z9119 Patient's noncompliance with other medical treatment and regimen: Secondary | ICD-10-CM | POA: Insufficient documentation

## 2012-03-23 DIAGNOSIS — M7989 Other specified soft tissue disorders: Secondary | ICD-10-CM | POA: Insufficient documentation

## 2012-03-23 DIAGNOSIS — M79609 Pain in unspecified limb: Secondary | ICD-10-CM

## 2012-03-23 DIAGNOSIS — J069 Acute upper respiratory infection, unspecified: Secondary | ICD-10-CM

## 2012-03-23 DIAGNOSIS — Y939 Activity, unspecified: Secondary | ICD-10-CM | POA: Insufficient documentation

## 2012-03-23 DIAGNOSIS — S139XXA Sprain of joints and ligaments of unspecified parts of neck, initial encounter: Secondary | ICD-10-CM | POA: Insufficient documentation

## 2012-03-23 DIAGNOSIS — N400 Enlarged prostate without lower urinary tract symptoms: Secondary | ICD-10-CM | POA: Insufficient documentation

## 2012-03-23 DIAGNOSIS — R51 Headache: Secondary | ICD-10-CM | POA: Insufficient documentation

## 2012-03-23 DIAGNOSIS — R0982 Postnasal drip: Secondary | ICD-10-CM | POA: Insufficient documentation

## 2012-03-23 DIAGNOSIS — J029 Acute pharyngitis, unspecified: Secondary | ICD-10-CM | POA: Insufficient documentation

## 2012-03-23 DIAGNOSIS — S161XXA Strain of muscle, fascia and tendon at neck level, initial encounter: Secondary | ICD-10-CM

## 2012-03-23 DIAGNOSIS — J3489 Other specified disorders of nose and nasal sinuses: Secondary | ICD-10-CM | POA: Insufficient documentation

## 2012-03-23 DIAGNOSIS — Z87891 Personal history of nicotine dependence: Secondary | ICD-10-CM | POA: Insufficient documentation

## 2012-03-23 DIAGNOSIS — I825Y9 Chronic embolism and thrombosis of unspecified deep veins of unspecified proximal lower extremity: Secondary | ICD-10-CM

## 2012-03-23 DIAGNOSIS — H9209 Otalgia, unspecified ear: Secondary | ICD-10-CM | POA: Insufficient documentation

## 2012-03-23 DIAGNOSIS — R6889 Other general symptoms and signs: Secondary | ICD-10-CM | POA: Insufficient documentation

## 2012-03-23 LAB — COMPREHENSIVE METABOLIC PANEL
ALT: 38 U/L (ref 0–53)
AST: 21 U/L (ref 0–37)
Albumin: 3.7 g/dL (ref 3.5–5.2)
CO2: 26 mEq/L (ref 19–32)
Calcium: 9.2 mg/dL (ref 8.4–10.5)
Sodium: 137 mEq/L (ref 135–145)
Total Protein: 6.8 g/dL (ref 6.0–8.3)

## 2012-03-23 LAB — CBC WITH DIFFERENTIAL/PLATELET
Basophils Absolute: 0 10*3/uL (ref 0.0–0.1)
Basophils Relative: 1 % (ref 0–1)
Eosinophils Absolute: 0.1 10*3/uL (ref 0.0–0.7)
Eosinophils Relative: 3 % (ref 0–5)
Lymphocytes Relative: 49 % — ABNORMAL HIGH (ref 12–46)
MCHC: 35.7 g/dL (ref 30.0–36.0)
MCV: 94.3 fL (ref 78.0–100.0)
Platelets: 256 10*3/uL (ref 150–400)
RDW: 12.9 % (ref 11.5–15.5)
WBC: 4.9 10*3/uL (ref 4.0–10.5)

## 2012-03-23 LAB — POCT I-STAT TROPONIN I

## 2012-03-23 MED ORDER — OXYCODONE-ACETAMINOPHEN 5-325 MG PO TABS
2.0000 | ORAL_TABLET | Freq: Once | ORAL | Status: AC
  Administered 2012-03-23: 2 via ORAL
  Filled 2012-03-23: qty 2

## 2012-03-23 MED ORDER — AMOXICILLIN 250 MG PO CAPS: 250.0000 mg | ORAL_CAPSULE | Freq: Two times a day (BID) | ORAL | Status: DC

## 2012-03-23 MED ORDER — HYDROCODONE-ACETAMINOPHEN 5-325 MG PO TABS: 1.0000 | ORAL_TABLET | ORAL | Status: DC | PRN

## 2012-03-23 MED ORDER — METHOCARBAMOL 500 MG PO TABS: 500.0000 mg | ORAL_TABLET | Freq: Two times a day (BID) | ORAL | Status: DC

## 2012-03-23 MED ORDER — HYDROCODONE-ACETAMINOPHEN 5-500 MG PO TABS: 1.0000 | ORAL_TABLET | Freq: Four times a day (QID) | ORAL | Status: DC | PRN

## 2012-03-23 MED ORDER — IOHEXOL 350 MG/ML SOLN
100.0000 mL | Freq: Once | INTRAVENOUS | Status: AC | PRN
  Administered 2012-03-23: 100 mL via INTRAVENOUS

## 2012-03-23 MED ORDER — CYCLOBENZAPRINE HCL 10 MG PO TABS: 10.0000 mg | ORAL_TABLET | Freq: Two times a day (BID) | ORAL | Status: DC | PRN

## 2012-03-23 MED ORDER — IPRATROPIUM BROMIDE 0.03 % NA SOLN: 2.0000 | Freq: Two times a day (BID) | NASAL | Status: DC

## 2012-03-23 MED ORDER — FLUTICASONE PROPIONATE 50 MCG/ACT NA SUSP: 2.0000 | Freq: Every day | NASAL | Status: DC

## 2012-03-23 MED ORDER — GUAIFENESIN ER 600 MG PO TB12: 1200.0000 mg | ORAL_TABLET | Freq: Two times a day (BID) | ORAL | Status: DC

## 2012-03-23 NOTE — Progress Notes (Signed)
VASCULAR LAB PRELIMINARY  PRELIMINARY  PRELIMINARY  PRELIMINARY  Left lower extremity venous duplex completed.    Preliminary report:  Positive for left lower extremity acute DVT coursing from the mid popliteal through the distal femoral veins. Also noted is a superficial thrombus in the area of pain on the dorsum of the left foot. There is no propagation to the right side. No evidence of a Baker's cyst.  Okie Jansson, RVS 03/23/2012, 6:47 PM

## 2012-03-23 NOTE — ED Notes (Signed)
Pt is here with complaints of full body aching, neck, head hurts, and left chest hurts and whole back hurts.  Pt concerned with left foot.  Pt states that symptoms started 3 days ago.  No fever, but reports chills

## 2012-03-23 NOTE — ED Notes (Signed)
Pt to vascular lab

## 2012-03-23 NOTE — ED Provider Notes (Addendum)
History     CSN: [id number]  Arrival date & time 03/23/12  1445   First MD Initiated Contact with Patient 03/23/12 1620      No chief complaint on file.   (Consider location/radiation/quality/duration/timing/severity/associated sxs/prior treatment) The history is provided by the patient and medical records.    [name] is a 47 y.o. male  with a hx of DVT, PE, BPH, anticoagulation, HTN presents to the Emergency Department complaining of gradual, persistent, progressively worsening body aches, specifically his head, neck, L chest and L shoulder, back middle and lower and onset 3 days ago. Associated symptoms include chills and swelling of LLE with discoloration of the foot x4 weeks, throat irritation since yesterday, nasal congestion, post nasal drip.  Pt states he has had coryza for the past 3 days along with the other symptoms.  Pt states he has tried to suppress the cough because it makes the pain in his body worse.  Nothing makes it better and nothing makes it worse.  Pt denies fever, .     Past Medical History  Diagnosis Date  . DVT (deep venous thrombosis) 01/08/2011    lt leg  . DVT of lower limb, acute 01/12/2011  . BPH (benign prostatic hyperplasia)   . Current use of long term anticoagulation   . 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 Noncompliance w/medication treatment due to intermit use of medication   . History of tobacco use     QUIT early 2013  . Hypertension   . Depression     Past Surgical History  Procedure Date  . Tonsillectomy     No family history on file.  History  Substance Use Topics  . Smoking status: Former Smoker -- 6 years    Types: Cigars    Quit date: 09/14/2010  . Smokeless tobacco: Never Used  . Alcohol Use: 1.2 oz/week    2 Cans of beer per week      Review of Systems  Constitutional: Positive for chills. Negative for fever, diaphoresis, appetite change, fatigue and unexpected  weight change.  HENT: Positive for ear pain, congestion, sore throat, rhinorrhea, sneezing, postnasal drip and sinus pressure. Negative for mouth sores, neck pain and neck stiffness.   Eyes: Negative for visual disturbance.  Respiratory: Negative for cough, chest tightness, shortness of breath and wheezing.   Cardiovascular: Positive for chest pain and leg swelling.  Gastrointestinal: Negative for nausea, vomiting, abdominal pain, diarrhea and constipation.  Genitourinary: Negative for dysuria, urgency, frequency and hematuria.  Musculoskeletal: Positive for back pain and arthralgias.  Skin: Positive for color change. Negative for rash.  Neurological: Positive for headaches. Negative for syncope and light-headedness.  Psychiatric/Behavioral: Negative for sleep disturbance. The patient is not nervous/anxious.   All other systems reviewed and are negative.    Allergies  Review of patient's allergies indicates no known allergies.  Home Medications   Current Outpatient Rx  Name  Route  Sig  Dispense  Refill  . ACETAMINOPHEN 500 MG PO TABS   Oral   Take 1,000 mg by mouth every 6 (six) hours as needed. pain         . ADULT MULTIVITAMIN W/MINERALS CH   Oral   Take 1 tablet by mouth daily.         Marland Kitchen BACITRACIN-NEOMYCIN-POLYMYXIN OINTMENT TUBE   Topical   Apply 1 application topically 3 (three) times daily as needed. For foot pain         .  RIVAROXABAN 20 MG PO TABS   Oral   Take 20 mg by mouth daily with supper.         Marland Kitchen TAMSULOSIN HCL 0.4 MG PO CAPS   Oral   Take 0.4 mg by mouth daily after breakfast.           BP 132/71  Pulse 52  Resp 18  SpO2 98%  Physical Exam  Nursing note and vitals reviewed. Constitutional: He is oriented to person, place, and time. He appears well-developed and well-nourished. No distress.  HENT:  Head: Normocephalic and atraumatic.  Right Ear: Tympanic membrane, external ear and ear canal normal.  Left Ear: External ear and ear  canal normal. A middle ear effusion is present.  Nose: No mucosal edema or rhinorrhea.  Mouth/Throat: Uvula is midline and oropharynx is clear and moist. No oropharyngeal exudate, posterior oropharyngeal edema, posterior oropharyngeal erythema or tonsillar abscesses.  Eyes: Conjunctivae normal are normal. Pupils are equal, round, and reactive to light. No scleral icterus.  Neck: Normal range of motion. Neck supple. Muscular tenderness present. No spinous process tenderness present. Normal range of motion present.  Cardiovascular: Normal rate, regular rhythm, normal heart sounds and intact distal pulses.  Exam reveals no gallop and no friction rub.   No murmur heard. Pulmonary/Chest: Effort normal and breath sounds normal. No respiratory distress. He has no wheezes. He has no rales. He exhibits tenderness (to palpation of the L chest).    Abdominal: Soft. Bowel sounds are normal. He exhibits no distension and no mass. There is no tenderness. There is no rebound and no guarding.  Musculoskeletal: Normal range of motion. He exhibits no edema.       Left shoulder: He exhibits tenderness. He exhibits normal range of motion (pain with ROM).       Palpable cord in the dorsum of the L foot Pain to palpation of the L calf Pain to palpation of the t-spine and l-spine  Lymphadenopathy:    He has no cervical adenopathy.  Neurological: He is alert and oriented to person, place, and time. He exhibits normal muscle tone. Coordination normal.       Speech is clear and goal oriented Moves extremities without ataxia  Skin: Skin is warm and dry. He is not diaphoretic.       Visible skin changes over the palpable cord of the dorsum of the L foot Chronic venous skin changes of the LE bilaterally  Psychiatric: He has a normal mood and affect.    ED Course  Procedures (including critical care time)  Labs Reviewed  CBC WITH DIFFERENTIAL - Abnormal; Notable for the following:    Neutrophils Relative 38 (*)       Lymphocytes Relative 49 (*)     All other components within normal limits  COMPREHENSIVE METABOLIC PANEL - Abnormal; Notable for the following:    Total Bilirubin 0.2 (*)     GFR calc non Af Amer 66 (*)     GFR calc Af Amer 77 (*)     All other components within normal limits  POCT I-STAT TROPONIN I   Dg Chest 2 View  03/23/2012  *RADIOLOGY REPORT*  Clinical Data: Chest pain.  CHEST - 2 VIEW  Comparison: January 07, 2011.  Findings: Cardiomediastinal silhouette appears normal.  No acute pulmonary disease is noted.  Bony thorax is intact.  IMPRESSION: No acute cardiopulmonary abnormality seen.   Original Report Authenticated By: Lupita Raider.,  M.D.    Ct Angio Chest  W/cm &/or Wo Cm  03/23/2012  *RADIOLOGY REPORT*  Clinical Data: Chest pain.  History of pulmonary emboli.  CT ANGIOGRAPHY CHEST  Technique:  Multidetector CT imaging of the chest using the standard protocol during bolus administration of intravenous contrast. Multiplanar reconstructed images including MIPs were obtained and reviewed to evaluate the vascular anatomy.  Contrast: OMNIPAQUE IOHEXOL 350 MG/ML SOLN  Comparison: 01/08/2011  Findings:  There is good contrast opacification of the pulmonary artery branches.  No discrete filling defect to suggest acute PE.There has been clearance of the previously identified emboli with no CT evidence of chronic PE.  Incomplete aortic opacification.  No evidence of aneurysm.  No hilar or mediastinal adenopathy.  No pleural or pericardial effusion.  Dependent atelectasis posteriorly in both lower lobes.  Lungs otherwise clear.  Visualized portions of upper abdomen unremarkable.  Thoracic spine and sternum unremarkable.  IMPRESSION: Negative.  No acute PE.   Original Report Authenticated By: D. Andria Rhein, MD    ECG:  Date: 03/23/2012  Rate: 59  Rhythm: sinus bradycardia  QRS Axis: normal  Intervals: normal  ST/T Wave abnormalities: nonspecific ST changes  Conduction  Disutrbances:none  Narrative Interpretation: nonspecific ST elevation noted in V3, V4, no reciprocal changes  Old EKG Reviewed: unchanged    1. DVT, lower extremity, proximal, chronic   2. History of pulmonary embolism   3. Noncompliance w/medication treatment due to intermit use of medication   4. Cervical strain   5. Viral URI       MDM  Gilman Buttner presents with chest pain, coryza symptoms and left lower shotty swelling.  History of DVT and PE makes me concerned for same today.  Exam concerning for a superficial thrombosis of the left lower extremity will get lower extremity venous duplex to rule out DVT.  Patient takes also for anticoagulation however proximal to 4 weeks ago his medication was late and he missed just over one and a half weeks.  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  ECG nonischemic, CMP, CBC unremarkable, troponin negative, chest x-ray without acute pulmonary abnormality.  LE venous duplex: Positive for left lower extremity acute DVT coursing from the mid popliteal through the distal femoral veins. Also noted is a superficial thrombus in the area of pain on the dorsum of the left foot. There is no propagation to the right side. No evidence of a Baker's cyst.  CT angio pending for rule out of PE.  I discussed the patient with the hospitalist who will admit.  Dr. Dione Booze was consulted, evaluated this patient with me and agrees with the plan.     Dierdre Forth, PA-C 03/23/12 2043  Discussed discussed with hospitalist. Hospitalist consult it with [facility] says that DVT without leg pain is likely chronic and therefore this is not Xeralto failure.  Scan for PE and if negative we'll discharge home.  If positive hospitalist will admit.    CT angio negative for acute PE.  Chest pain, shoulder pain and neck pain likely secondary to musculoskeletal strain. Patient has been afebrile  here but complains of coryza, likely attributed to viral upper resp 2iratory infection.  Will treat symptomatically.  Patient is to be discharged with recommendation to follow up with PCP in regards to today's hospital visit. Chest pain is not likely cardiac or pulmonary etiology d/t presentation, VSS, no tracheal deviation, no JVD or new murmur, RRR,  breath sounds equal bilaterally, EKG without acute abnormalities, negative troponin, and negative CXR. Pt has been advised start a PPI and return to the ED if CP becomes exertional, associated with diaphoresis or nausea, radiates to left jaw/arm, worsens or becomes concerning in any way. Pt appears reliable for follow up and is agreeable to discharge.   [facility]   Dahlia Client Zaydon Kinser, PA-C 03/23/12 2151  Dierdre Forth, PA-C 03/23/12 2153

## 2012-03-23 NOTE — ED Provider Notes (Signed)
47 year old male who's been on Zaroxolyn for DVT develop pain in his left foot and calf. Venous Doppler tests here demonstrated that presence of DVT. He states that he first noted the pain in his foot about 3 weeks ago and about 10 days before that, he had run out of his Zaroxolyn did go without it for about 1-1/2 weeks because of inability to get the prescription filled. However, symptoms have progressed since restarting 02. He may need to be evaluated her whether you should have an inferior vena cava filter placed given the fact that DVT has occurred and/or progressed while on appropriate anticoagulants. He'll be admitted to the hospital for further evaluation.  Medical screening examination/treatment/procedure(s) were conducted as a shared visit with non-physician practitioner(s) and myself.  I personally evaluated the patient during the encounter   Dione Booze, MD 03/23/12 2002

## 2012-03-23 NOTE — ED Notes (Signed)
Pt returned from CT Angio 

## 2012-03-23 NOTE — ED Notes (Signed)
Pt c/o flu like s/s. Pain in LLE x3 days.

## 2012-03-23 NOTE — ED Notes (Signed)
Pt reports swelling to LLE with discoloration in calf.  Pulse present

## 2012-03-24 ENCOUNTER — Encounter: Payer: Self-pay | Admitting: Internal Medicine

## 2012-04-25 ENCOUNTER — Encounter (HOSPITAL_COMMUNITY): Payer: Self-pay | Admitting: Emergency Medicine

## 2012-04-25 ENCOUNTER — Emergency Department (HOSPITAL_COMMUNITY)
Admission: EM | Admit: 2012-04-25 | Discharge: 2012-04-27 | Disposition: A | Discharge status: Rehabilitation Facility | Payer: Non-veteran care | Attending: Emergency Medicine | Admitting: Emergency Medicine

## 2012-04-25 DIAGNOSIS — F32A Depression, unspecified: Secondary | ICD-10-CM

## 2012-04-25 DIAGNOSIS — F1210 Cannabis abuse, uncomplicated: Secondary | ICD-10-CM | POA: Insufficient documentation

## 2012-04-25 DIAGNOSIS — F1910 Other psychoactive substance abuse, uncomplicated: Secondary | ICD-10-CM

## 2012-04-25 DIAGNOSIS — F329 Major depressive disorder, single episode, unspecified: Secondary | ICD-10-CM

## 2012-04-25 DIAGNOSIS — F3289 Other specified depressive episodes: Secondary | ICD-10-CM | POA: Insufficient documentation

## 2012-04-25 DIAGNOSIS — Z86711 Personal history of pulmonary embolism: Secondary | ICD-10-CM | POA: Insufficient documentation

## 2012-04-25 DIAGNOSIS — G479 Sleep disorder, unspecified: Secondary | ICD-10-CM | POA: Insufficient documentation

## 2012-04-25 DIAGNOSIS — I10 Essential (primary) hypertension: Secondary | ICD-10-CM | POA: Insufficient documentation

## 2012-04-25 DIAGNOSIS — N400 Enlarged prostate without lower urinary tract symptoms: Secondary | ICD-10-CM | POA: Insufficient documentation

## 2012-04-25 DIAGNOSIS — Z91199 Patient's noncompliance with other medical treatment and regimen due to unspecified reason: Secondary | ICD-10-CM | POA: Insufficient documentation

## 2012-04-25 DIAGNOSIS — Z008 Encounter for other general examination: Secondary | ICD-10-CM | POA: Insufficient documentation

## 2012-04-25 DIAGNOSIS — Z7901 Long term (current) use of anticoagulants: Secondary | ICD-10-CM | POA: Insufficient documentation

## 2012-04-25 DIAGNOSIS — M2550 Pain in unspecified joint: Secondary | ICD-10-CM | POA: Insufficient documentation

## 2012-04-25 DIAGNOSIS — Z79899 Other long term (current) drug therapy: Secondary | ICD-10-CM | POA: Insufficient documentation

## 2012-04-25 DIAGNOSIS — Z9119 Patient's noncompliance with other medical treatment and regimen: Secondary | ICD-10-CM | POA: Insufficient documentation

## 2012-04-25 DIAGNOSIS — Z86718 Personal history of other venous thrombosis and embolism: Secondary | ICD-10-CM | POA: Insufficient documentation

## 2012-04-25 DIAGNOSIS — F1410 Cocaine abuse, uncomplicated: Secondary | ICD-10-CM | POA: Insufficient documentation

## 2012-04-25 DIAGNOSIS — Z87891 Personal history of nicotine dependence: Secondary | ICD-10-CM | POA: Insufficient documentation

## 2012-04-25 LAB — RAPID URINE DRUG SCREEN, HOSP PERFORMED
Barbiturates: NOT DETECTED
Benzodiazepines: NOT DETECTED
Cocaine: POSITIVE — AB
Opiates: NOT DETECTED

## 2012-04-25 LAB — CBC WITH DIFFERENTIAL/PLATELET
Basophils Absolute: 0 10*3/uL (ref 0.0–0.1)
Eosinophils Absolute: 0.1 10*3/uL (ref 0.0–0.7)
Eosinophils Relative: 2 % (ref 0–5)
Lymphs Abs: 2.2 10*3/uL (ref 0.7–4.0)
MCH: 33.1 pg (ref 26.0–34.0)
MCV: 92.9 fL (ref 78.0–100.0)
Platelets: 263 10*3/uL (ref 150–400)
RDW: 12.8 % (ref 11.5–15.5)

## 2012-04-25 LAB — COMPREHENSIVE METABOLIC PANEL
ALT: 40 U/L (ref 0–53)
Calcium: 9.6 mg/dL (ref 8.4–10.5)
GFR calc Af Amer: 70 mL/min — ABNORMAL LOW (ref >90)
Glucose, Bld: 100 mg/dL — ABNORMAL HIGH (ref 70–99)
Sodium: 139 mEq/L (ref 135–145)
Total Protein: 7.7 g/dL (ref 6.0–8.3)

## 2012-04-25 MED ORDER — RIVAROXABAN 20 MG PO TABS
20.0000 mg | ORAL_TABLET | Freq: Every day | ORAL | Status: DC
  Administered 2012-04-25 – 2012-04-27 (×3): 20 mg via ORAL
  Filled 2012-04-25 (×3): qty 1

## 2012-04-25 MED ORDER — ACETAMINOPHEN 325 MG PO TABS
650.0000 mg | ORAL_TABLET | ORAL | Status: DC | PRN
  Administered 2012-04-25 – 2012-04-26 (×2): 650 mg via ORAL
  Filled 2012-04-25 (×2): qty 2

## 2012-04-25 MED ORDER — LORAZEPAM 1 MG PO TABS: 1.0000 mg | ORAL_TABLET | Freq: Three times a day (TID) | ORAL | Status: DC | PRN

## 2012-04-25 NOTE — ED Notes (Addendum)
Pt requesting to be checked into behavioral health for depression and detox from cocaine. Pt denies SI or HI. Pt reports that currently have blood in clots in left leg. Pt suppose to take blood thinners (xerelto) but has not taken in last two days. Pt unable to return back to house he was in and that is where his medications are.

## 2012-04-25 NOTE — ED Notes (Signed)
Pt up for discharge home after [facility] consult. Pt states he does have "a good environment" to go to tonight due a stressful environment. Concerned about using drugs. States he will be able to call the outpt resource provided by Tammy Sours from [facility] in the morning. Dr. Preston Fleeting updated and to hold discharge for tonight and discharge pt in the am. Pt updated.

## 2012-04-25 NOTE — ED Notes (Signed)
[facility] Staff in to see pt

## 2012-04-25 NOTE — BH Assessment (Addendum)
Assessment Note   [name] is an 47 y.o. male. Pt reports he has been on a 4 day cocaine binge.  States "I have a serious problem and need some help."  Pt reports he has had a long term problem with depression and that when he adds drugs to it it gets worse.  Pt used $500 cocaine in past 4 days.  Prior to this, pt was using cocaine once a week on payday, also smokes marijuana once a week or less.  Pt reports marital issues, not taking care of his kids.  Reports feeling depressed but denies SI, HI, AV.  Pt reports some fleeting SI during binge last week but states "I'm not going to do it" ie, kill himself.  [facility] for a month and found it helpful, although he relapsed one month after discharge after stopping his aftercare.  Pt would like to return to [facility] and reenter treatment.  ACT spoke to [facility] who meets clients during business hours M-F.  Axis I: cocaine dependence Axis II: Deferred Axis III:  Past Medical History  Diagnosis Date  . DVT (deep venous thrombosis) 01/08/2011    lt leg  . DVT of lower limb, acute 01/12/2011  . BPH (benign prostatic hyperplasia)   . Current use of long term anticoagulation   . 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 Noncompliance w/medication treatment due to intermit use of medication   . History of tobacco use     QUIT early 2013  . Hypertension   . Depression    Axis IV: economic problems and problems with primary support group Axis V: 51-60 moderate symptoms  Past Medical History:  Past Medical History  Diagnosis Date  . DVT (deep venous thrombosis) 01/08/2011    lt leg  . DVT of lower limb, acute 01/12/2011  . BPH (benign prostatic hyperplasia)   . Current use of long term anticoagulation   . 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 Noncompliance w/medication treatment due to intermit use of medication    . History of tobacco use     QUIT early 2013  . Hypertension   . Depression     Past Surgical History  Procedure Laterality Date  . Tonsillectomy      Family History: No family history on file.  Social History:  reports that he quit smoking about 19 months ago. His smoking use included Cigars. He has never used smokeless tobacco. He reports that he drinks about 1.2 ounces of alcohol per week. He reports that he uses illicit drugs (Cocaine) about 3 times per week.  Additional Social History:  Alcohol / Drug Use Pain Medications: Pt denies. Prescriptions: Pt denies. Over the Counter: Pt denies History of alcohol / drug use?: Yes Longest period of sobriety (when/how long): none recent Negative Consequences of Use: Financial;Legal;Personal relationships Substance #1 Name of Substance 1: cocaine 1 - Age of First Use: 25 1 - Amount (size/oz): $50-60 1 - Frequency: 1x week plus recent binge 1 - Duration: 15 years 1 - Last Use / Amount: Pt reports 4 day binge from 2/12-2/15, $500 cocaine used. Substance #2 Name of Substance 2: marijuana 2 - Age of First Use: 14 2 - Amount (size/oz): <1 joint 2 - Frequency: 1x week, often less 2 - Duration: 20 years 2 - Last Use / Amount: 2/15, <1 joint  CIWA: CIWA-Ar  BP: 134/74 mmHg Pulse Rate: 77 COWS:    Allergies: No Known Allergies  Home Medications:  (Not in a hospital admission)  OB/GYN Status:  No LMP for male patient.  General Assessment Data Location of Assessment: [facility] ED ACT Assessment: Yes Living Arrangements: Other relatives Can pt return to current living arrangement?: Yes Admission Status: Voluntary     Risk to self Suicidal Ideation: No-Not Currently/Within Last 6 Months Suicidal Intent: No Is patient at risk for suicide?: No Suicidal Plan?: No Access to Means: No What has been your use of drugs/alcohol within the last 12 months?: current significant use Previous Attempts/Gestures: No Intentional Self  Injurious Behavior: None Family Suicide History: No Recent stressful life event(s): Conflict (Comment) (with wife, substance abuse issues) Persecutory voices/beliefs?: No Depression: Yes Depression Symptoms: Despondent;Isolating;Guilt;Feeling worthless/self pity Substance abuse history and/or treatment for substance abuse?: Yes Suicide prevention information given to non-admitted patients: Yes  Risk to Others Homicidal Ideation: No Thoughts of Harm to Others: No Current Homicidal Intent: No Current Homicidal Plan: No Access to Homicidal Means: No History of harm to others?: No Assessment of Violence: None Noted Does patient have access to weapons?: No Criminal Charges Pending?: No Does patient have a court date: No  Psychosis Hallucinations: None noted Delusions: None noted  Mental Status Report Appear/Hygiene: Other (Comment) (casual) Eye Contact: Good Motor Activity: Unremarkable Speech: Logical/coherent Level of Consciousness: Alert Mood: Sad Affect: Appropriate to circumstance Anxiety Level: Minimal Thought Processes: Coherent;Relevant Judgement: Unimpaired Orientation: Person;Place;Time;Situation Obsessive Compulsive Thoughts/Behaviors: None  Cognitive Functioning Concentration: Normal Memory: Recent Intact;Remote Intact IQ: Average Insight: Good Impulse Control: Poor Appetite: Good Weight Loss: 0 Weight Gain: 0 Sleep: No Change Total Hours of Sleep: 6 Vegetative Symptoms: None  ADLScreening [facility] Assessment Services) Patient's cognitive ability adequate to safely complete daily activities?: Yes Patient able to express need for assistance with ADLs?: Yes Independently performs ADLs?: Yes (appropriate for developmental age)  Abuse/Neglect [facility]) Physical Abuse: Denies Verbal Abuse: Denies Sexual Abuse: Denies  Prior Inpatient Therapy Prior Inpatient Therapy: Yes (also [facility] x 2 for psych and CD 2013 and 2011) Prior Therapy Dates: 2013 Prior Therapy  Facilty/Provider(s): [facility] Reason for Treatment: sub abuse  Prior Outpatient Therapy Prior Outpatient Therapy: Yes Prior Therapy Dates: 2013 Prior Therapy Facilty/Provider(s): [facility] Reason for Treatment: psych, cd  ADL Screening (condition at time of admission) Patient's cognitive ability adequate to safely complete daily activities?: Yes Patient able to express need for assistance with ADLs?: Yes Independently performs ADLs?: Yes (appropriate for developmental age) Weakness of Legs: None Weakness of Arms/Hands: None  Home Assistive Devices/Equipment Home Assistive Devices/Equipment: None    Abuse/Neglect Assessment (Assessment to be complete while patient is alone) Physical Abuse: Denies Verbal Abuse: Denies Sexual Abuse: Denies Exploitation of patient/patient's resources: Yes, present (Comment) (Pt reports wife will take any money she can get from him) Self-Neglect: Denies Values / Beliefs Cultural Requests During Hospitalization: None Spiritual Requests During Hospitalization: None   Advance Directives (For Healthcare) Advance Directive: Patient does not have advance directive;Patient would not like information    Additional Information 1:1 In Past 12 Months?: No CIRT Risk: No Elopement Risk: No Does patient have medical clearance?: Yes     Disposition: Discussed this pt with Dr Preston Fleeting of [facility] who agreed with plan to discharge pt for follow up with [facility].  Contact info given to pt. Disposition Disposition of Patient: Other dispositions Other disposition(s): Referred to outside facility [facility])  On Site Evaluation by:   Reviewed with Physician:  Lorri Frederick 04/25/2012 8:20 PM

## 2012-04-25 NOTE — ED Notes (Signed)
Patient states that he used cocaine last on 04/24/2012

## 2012-04-25 NOTE — ED Provider Notes (Signed)
ACT Team consult appreciated. The patient is abusing cocaine and does not qualify for inpatient detox. He has been instructed to follow up with [facility].  Dione Booze, MD 04/25/12 2029

## 2012-04-25 NOTE — ED Notes (Signed)
MD at bedside. 

## 2012-04-25 NOTE — ED Notes (Signed)
Ordered lunch 

## 2012-04-25 NOTE — ED Provider Notes (Signed)
History     CSN: [id number]  Arrival date & time 04/25/12  4098   First MD Initiated Contact with Patient 04/25/12 1028      Chief Complaint  Patient presents with  . Depression  . Medical Clearance    (Consider location/radiation/quality/duration/timing/severity/associated sxs/prior treatment) HPI Comments: [name] is a 47 y.o. Male presenting with increasing depression without suicidal or homicidal ideation over the past 2 days.  Patient also has a history of polysubstance abuse including cocaine and marijuana,  Both used last night.  He has had very little sleep in the past several days,  Partially due to worry about life stressors which he is reluctant to elaborate on at this time,  But also due to cocaine use last night.  He denies visual or auditory hallucinations.  He is here voluntarily for assistance.  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  He denies chest pain and shortness of breath.  His primary provider is the [place] [facility].     The history is provided by the patient.    Past Medical History  Diagnosis Date  . DVT (deep venous thrombosis) 01/08/2011    lt leg  . DVT of lower limb, acute 01/12/2011  . BPH (benign prostatic hyperplasia)   . Current use of long term anticoagulation   . 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 Noncompliance w/medication treatment due to intermit use of medication   . History of tobacco use     QUIT early 2013  . Hypertension   . Depression     Past Surgical History  Procedure Laterality Date  . Tonsillectomy      No family history on file.  History  Substance Use Topics  . Smoking status: Former Smoker -- 6 years    Types: Cigars    Quit date: 09/14/2010  . Smokeless tobacco: Never Used  . Alcohol Use: 1.2 oz/week    2 Cans of beer per week     Comment: no alcohol since 03/2012       Review of Systems  Constitutional: Negative for fever and chills.  HENT: Negative for congestion, sore throat and neck pain.   Eyes: Negative.   Respiratory: Negative for chest tightness and shortness of breath.   Cardiovascular: Negative for chest pain.  Gastrointestinal: Negative for nausea and abdominal pain.  Genitourinary: Negative.   Musculoskeletal: Positive for arthralgias. Negative for joint swelling.  Skin: Negative.  Negative for rash and wound.  Neurological: Negative for dizziness, weakness, light-headedness, numbness and headaches.  Psychiatric/Behavioral: Positive for sleep disturbance. Negative for suicidal ideas and hallucinations. The patient is not nervous/anxious.     Allergies  Review of patient's allergies indicates no known allergies.  Home Medications   Current Outpatient Rx  Name  Route  Sig  Dispense  Refill  . acetaminophen (TYLENOL) 500 MG tablet   Oral   Take 1,000 mg by mouth every 6 (six) hours as needed. pain         . Multiple Vitamin (MULTIVITAMIN WITH MINERALS) TABS   Oral   Take 1 tablet by mouth daily.         Marland Kitchen neomycin-bacitracin-polymyxin (NEOSPORIN) OINT   Topical   Apply 1 application topically 3 (three) times daily as needed. For foot pain         . Rivaroxaban (  XARELTO) 20 MG TABS   Oral   Take 20 mg by mouth daily with supper.         . Tamsulosin HCl (FLOMAX) 0.4 MG CAPS   Oral   Take 0.4 mg by mouth daily after breakfast.           BP 129/74  Pulse 72  Temp(Src) 98 F (36.7 C) (Oral)  Resp 20  SpO2 96%  Physical Exam  Nursing note and vitals reviewed. Constitutional: He appears well-developed and well-nourished. No distress.  HENT:  Head: Normocephalic and atraumatic.  Eyes: Conjunctivae are normal.  Neck: Normal range of motion.  Cardiovascular: Normal rate, regular rhythm, normal heart sounds and intact distal pulses.   Pulses:      Dorsalis pedis pulses are 2+ on the right side, and 2+ on  the left side.  Pulmonary/Chest: Effort normal and breath sounds normal. He has no wheezes.  Abdominal: Soft. Bowel sounds are normal. There is no tenderness.  Musculoskeletal: Normal range of motion.  Mild discomfort left upper posterior calf.    Neurological: He is alert.  Skin: Skin is warm and dry.  Psychiatric: Thought content normal. His affect is blunt. His speech is not rapid and/or pressured. He is withdrawn. Cognition and memory are normal. He exhibits a depressed mood. He expresses no suicidal plans and no homicidal plans.    ED Course  Procedures (including critical care time)  Labs Reviewed  COMPREHENSIVE METABOLIC PANEL - Abnormal; Notable for the following:    Glucose, Bld 100 (*)    Creatinine, Ser 1.37 (*)    GFR calc non Af Amer 60 (*)    GFR calc Af Amer 70 (*)    All other components within normal limits  SALICYLATE LEVEL - Abnormal; Notable for the following:    Salicylate Lvl <2.0 (*)    All other components within normal limits  CBC WITH DIFFERENTIAL  ETHANOL  ACETAMINOPHEN LEVEL  URINE RAPID DRUG SCREEN (HOSP PERFORMED)   No results found.   1. Depression   2. Polysubstance abuse       MDM  Spoke with ACT team who will evaluate patient for assistance/placement,  Pt to be moved to pod C. Holding orders given.        Burgess Amor, [place] 04/25/12 1201

## 2012-04-25 NOTE — ED Provider Notes (Signed)
Medical screening examination/treatment/procedure(s) were performed by non-physician practitioner and as supervising physician I was immediately available for consultation/collaboration.  Tobin Chad, MD 04/25/12 [phone]

## 2012-04-26 NOTE — BH Assessment (Signed)
[facility] Assessment Progress Note      Update:  Completed prescreen over phone with Shalya @ [facility] at 1000.  Pt accepted at [facility] for treatment per [facility] @ 1300 and will be picked up in AM by [facility] transportation at 1000 on 04/27/12 provided pt has 2 weeks of his medication with him.

## 2012-04-26 NOTE — ED Notes (Signed)
Adelina Mings (cousin) [phone]

## 2012-04-26 NOTE — ED Notes (Signed)
IN TO DISCHARGE PATIENT. STATES IF WE DISCHARGE HIM HE WILL GO USE MARIJUANA AND COCAINE. ACT TEAM AWARE AND THEY WILL ADDRESS THIS WITH PT. PT STATES HE WANTS TO GO TO TREATMENT.

## 2012-04-26 NOTE — ED Notes (Signed)
ACT TEAM IS GOING TO TRY TO ARRANGE TREATMENT FOR PT AT [facility]

## 2012-04-26 NOTE — ED Provider Notes (Signed)
[name] is a 47 y.o. male here with drug abuse. Holding overnight since patient has no place to go. He can go to [facility] program. Was sleeping comfortably this AM and no issues overnight. As per ACT, he doesn't qualify for inpatient detox. Will d/c this AM to go to [facility]. Not suicidal or homicidal.    Richardean Canal, MD 04/26/12 [phone]

## 2012-04-26 NOTE — ED Notes (Signed)
Pt given meal and ginger ale. 

## 2012-04-27 NOTE — BH Assessment (Signed)
Assessment Note   [name] is an 47 y.o. male that was reassessed this day.  Pt denies SI/HI or psychosis.  Pt requesting treatment for cocaine dependence and marijuana abuse.  Pt accepted at [facility] per [facility] and confirmed again this morning @ 0900.  Pt to be picked up by [facility] transportation.  EDP Ignacia Palma and ED staff aware.  Pt to be discharged to [facility].  Completed reassessment, assessment notification, and faxed to [facility] to log.    Previous Notes:  Update: Completed prescreen over phone with Shalya @ [facility] at 1000. Pt accepted at [facility] for treatment per [facility] @ 1300 and will be picked up in AM by [facility] transportation at 1000 on 04/27/12 provided pt has 2 weeks of his medication with him.      [name] is an 47 y.o. male. Pt reports he has been on a 4 day cocaine binge. States "I have a serious problem and need some help." Pt reports he has had a long term problem with depression and that when he adds drugs to it it gets worse. Pt used $500 cocaine in past 4 days. Prior to this, pt was using cocaine once a week on payday, also smokes marijuana once a week or less. Pt reports marital issues, not taking care of his kids. Reports feeling depressed but denies SI, HI, AV. Pt reports some fleeting SI during binge last week but states "I'm not going to do it" ie, kill himself. [facility] for a month and found it helpful, although he relapsed one month after discharge after stopping his aftercare. Pt would like to return to [facility] and reenter treatment. ACT spoke to [facility] who meets clients during business hours M-F.   Axis I: 304.20 Cocaine Dependence, 305.20 Cannabis Abuse Axis II: Deferred Axis III:  Past Medical History  Diagnosis Date  . DVT (deep venous thrombosis) 01/08/2011    lt leg  . DVT of lower limb, acute 01/12/2011  . BPH (benign prostatic hyperplasia)   . Current use of long term anticoagulation   . Arterial embolism and thrombosis, upper  extremity 07/22/2011    right radial artery  . Noncompliance w/medication treatment due to intermit use of medication   . History of tobacco use     QUIT early 2013  . Hypertension   . Depression    Axis IV: other psychosocial or environmental problems, problems related to social environment and problems with primary support group Axis V: 41-50 serious symptoms  Past Medical History:  Past Medical History  Diagnosis Date  . DVT (deep venous thrombosis) 01/08/2011    lt leg  . DVT of lower limb, acute 01/12/2011  . BPH (benign prostatic hyperplasia)   . Current use of long term anticoagulation   . 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 Noncompliance w/medication treatment due to intermit use of medication   . History of tobacco use     QUIT early 2013  . Hypertension   . Depression     Past Surgical History  Procedure Laterality Date  . Tonsillectomy      Family History: No family history on file.  Social History:  reports that he quit smoking about 19 months ago. His smoking use included Cigars. He has never used smokeless tobacco. He reports that he drinks about 1.2 ounces of alcohol per week. He reports that he uses illicit drugs (Cocaine) about 3 times  per week.  Additional Social History:  Alcohol / Drug Use Pain Medications: see MAR Prescriptions: see MAR Over the Counter: see MAR History of alcohol / drug use?: Yes Longest period of sobriety (when/how long): unknown Negative Consequences of Use: Personal relationships Withdrawal Symptoms:  (pt denies) Substance #1 Name of Substance 1: cocaine 1 - Age of First Use: 25 1 - Amount (size/oz): $50-60 1 - Frequency: 1x/week plus recent binge 1 - Duration: 15 years 1 - Last Use / Amount: 2/12-2/15 - 4 day binge - used $500 Substance #2 Name of Substance 2: marijuana 2 - Age of First Use: 14 2 - Amount (size/oz): <1 joint 2 - Frequency: 1x/week 2 - Duration: 20  years 2 - Last Use / Amount: 2/15 - 1 joint  CIWA: CIWA-Ar BP: 147/81 mmHg Pulse Rate: 64 COWS: Clinical Opiate Withdrawal Scale (COWS) Resting Pulse Rate: Pulse Rate 80 or below Sweating: No report of chills or flushing Restlessness: Able to sit still Pupil Size: Pupils pinned or normal size for room light Bone or Joint Aches: Not present Runny Nose or Tearing: Not present GI Upset: No GI symptoms Tremor: No tremor Yawning: No yawning Anxiety or Irritability: None Gooseflesh Skin: Skin is smooth COWS Total Score: 0  Allergies: No Known Allergies  Home Medications:  (Not in a hospital admission)  OB/GYN Status:  No LMP for male patient.  General Assessment Data Location of Assessment: [facility] ACT Assessment: Yes Living Arrangements: Other relatives Can pt return to current living arrangement?: Yes Admission Status: Voluntary  Education Status Is patient currently in school?: No  Risk to self Suicidal Ideation: No Suicidal Intent: No Is patient at risk for suicide?: No Suicidal Plan?: No Access to Means: No What has been your use of drugs/alcohol within the last 12 months?: current use of cocaine and marijuana Previous Attempts/Gestures: No How many times?: 0 Other Self Harm Risks: pt denies Triggers for Past Attempts: None known Intentional Self Injurious Behavior: None Family Suicide History: No Recent stressful life event(s): Conflict (Comment) (with wife, SA issues) Persecutory voices/beliefs?: No Depression: Yes Depression Symptoms: Despondent;Isolating;Guilt;Loss of interest in usual pleasures;Feeling worthless/self pity Substance abuse history and/or treatment for substance abuse?: Yes Suicide prevention information given to non-admitted patients: Yes  Risk to Others Homicidal Ideation: No Thoughts of Harm to Others: No Current Homicidal Intent: No Current Homicidal Plan: No Access to Homicidal Means: No Identified Victim: pt denies History of harm  to others?: No Assessment of Violence: None Noted Violent Behavior Description: na - pt calm, cooperative Does patient have access to weapons?: No Criminal Charges Pending?: No Does patient have a court date: No  Psychosis Hallucinations: None noted Delusions: None noted  Mental Status Report Appear/Hygiene: Other (Comment) (casual) Eye Contact: Good Motor Activity: Unremarkable Speech: Logical/coherent Level of Consciousness: Alert Mood: Sad Affect: Appropriate to circumstance Anxiety Level: Minimal Thought Processes: Coherent;Relevant Judgement: Unimpaired Orientation: Person;Place;Time;Situation Obsessive Compulsive Thoughts/Behaviors: None  Cognitive Functioning Concentration: Normal Memory: Recent Intact;Remote Intact IQ: Average Insight: Good Impulse Control: Poor Appetite: Good Weight Loss: 0 Weight Gain: 0 Sleep: No Change Total Hours of Sleep: 6 Vegetative Symptoms: None  ADLScreening [facility] Assessment Services) Patient's cognitive ability adequate to safely complete daily activities?: Yes Patient able to express need for assistance with ADLs?: Yes Independently performs ADLs?: Yes (appropriate for developmental age)  Abuse/Neglect [facility]) Physical Abuse: Denies Verbal Abuse: Denies Sexual Abuse: Denies  Prior Inpatient Therapy Prior Inpatient Therapy:  (Also at [facility] x 2 for CD and psych in 2011,  2013) Prior Therapy Dates: 2013 Prior Therapy Facilty/Provider(s): [facility] Reason for Treatment: sub abuse  Prior Outpatient Therapy Prior Outpatient Therapy: Yes Prior Therapy Dates: 2013 Prior Therapy Facilty/Provider(s): [facility] Reason for Treatment: psych, cd  ADL Screening (condition at time of admission) Patient's cognitive ability adequate to safely complete daily activities?: Yes Patient able to express need for assistance with ADLs?: Yes Independently performs ADLs?: Yes (appropriate for developmental age) Weakness of Legs:  None Weakness of Arms/Hands: None  Home Assistive Devices/Equipment Home Assistive Devices/Equipment: None    Abuse/Neglect Assessment (Assessment to be complete while patient is alone) Physical Abuse: Denies Verbal Abuse: Denies Sexual Abuse: Denies Exploitation of patient/patient's resources: Denies Self-Neglect: Denies Values / Beliefs Cultural Requests During Hospitalization: None Spiritual Requests During Hospitalization: None Consults Spiritual Care Consult Needed: No Social Work Consult Needed: No Merchant navy officer (For Healthcare) Advance Directive: Patient does not have advance directive;Patient would not like information    Additional Information 1:1 In Past 12 Months?: No CIRT Risk: No Elopement Risk: No Does patient have medical clearance?: Yes     Disposition:  Disposition Disposition of Patient: Inpatient treatment program Type of inpatient treatment program: Adult Other disposition(s): Referred to outside facility (Pt accepted at [facility] for SA treatment)  On Site Evaluation by:   Reviewed with Physician:  Bryson Ha, Rennis Harding 04/27/2012 9:12 AM

## 2012-04-27 NOTE — Progress Notes (Signed)
9:16 AM Pt is being discharged to [facility].

## 2012-04-27 NOTE — ED Notes (Signed)
Called [facility] to check on delay in transport. They advise the driver will be here in about 10 minutes

## 2012-04-27 NOTE — ED Notes (Signed)
Patient eating breakfast. He is to go to [facility] this morning around 10 am . Pt states he has a family member bringing is medications around 9 am. He denies any physical complaint.

## 2012-10-02 ENCOUNTER — Emergency Department (HOSPITAL_COMMUNITY)
Admission: EM | Admit: 2012-10-02 | Discharge: 2012-10-03 | Disposition: A | Discharge status: Home / Self Care | Payer: Non-veteran care | Attending: Emergency Medicine | Admitting: Emergency Medicine

## 2012-10-02 ENCOUNTER — Encounter (HOSPITAL_COMMUNITY): Payer: Self-pay | Admitting: Emergency Medicine

## 2012-10-02 DIAGNOSIS — I10 Essential (primary) hypertension: Secondary | ICD-10-CM | POA: Insufficient documentation

## 2012-10-02 DIAGNOSIS — Z9119 Patient's noncompliance with other medical treatment and regimen: Secondary | ICD-10-CM | POA: Insufficient documentation

## 2012-10-02 DIAGNOSIS — Y929 Unspecified place or not applicable: Secondary | ICD-10-CM | POA: Insufficient documentation

## 2012-10-02 DIAGNOSIS — Z79899 Other long term (current) drug therapy: Secondary | ICD-10-CM | POA: Insufficient documentation

## 2012-10-02 DIAGNOSIS — Z8659 Personal history of other mental and behavioral disorders: Secondary | ICD-10-CM | POA: Insufficient documentation

## 2012-10-02 DIAGNOSIS — N400 Enlarged prostate without lower urinary tract symptoms: Secondary | ICD-10-CM | POA: Insufficient documentation

## 2012-10-02 DIAGNOSIS — B9789 Other viral agents as the cause of diseases classified elsewhere: Secondary | ICD-10-CM | POA: Insufficient documentation

## 2012-10-02 DIAGNOSIS — Z7901 Long term (current) use of anticoagulants: Secondary | ICD-10-CM | POA: Insufficient documentation

## 2012-10-02 DIAGNOSIS — W57XXXA Bitten or stung by nonvenomous insect and other nonvenomous arthropods, initial encounter: Secondary | ICD-10-CM

## 2012-10-02 DIAGNOSIS — Z86718 Personal history of other venous thrombosis and embolism: Secondary | ICD-10-CM | POA: Insufficient documentation

## 2012-10-02 DIAGNOSIS — Z87891 Personal history of nicotine dependence: Secondary | ICD-10-CM | POA: Insufficient documentation

## 2012-10-02 DIAGNOSIS — Y939 Activity, unspecified: Secondary | ICD-10-CM | POA: Insufficient documentation

## 2012-10-02 DIAGNOSIS — B349 Viral infection, unspecified: Secondary | ICD-10-CM

## 2012-10-02 DIAGNOSIS — Z91199 Patient's noncompliance with other medical treatment and regimen due to unspecified reason: Secondary | ICD-10-CM | POA: Insufficient documentation

## 2012-10-02 MED ORDER — DOXYCYCLINE HYCLATE 100 MG PO TABS
100.0000 mg | ORAL_TABLET | Freq: Once | ORAL | Status: AC
  Administered 2012-10-02: 100 mg via ORAL
  Filled 2012-10-02: qty 1

## 2012-10-02 MED ORDER — ACETAMINOPHEN 325 MG PO TABS
650.0000 mg | ORAL_TABLET | Freq: Once | ORAL | Status: AC
  Administered 2012-10-02: 650 mg via ORAL
  Filled 2012-10-02: qty 2

## 2012-10-02 MED ORDER — DOXYCYCLINE HYCLATE 100 MG PO CAPS: 100.0000 mg | ORAL_CAPSULE | Freq: Two times a day (BID) | ORAL | Status: DC

## 2012-10-02 NOTE — ED Notes (Signed)
PT. REPORTS TICK BITE AT BACK OF NECK LAST Monday , PT. STATED SLIGHT HEADACHE / LEFT SHOULDER MUSCLE ACHE. DENIES FEVER OR CHILLS.

## 2012-10-02 NOTE — ED Provider Notes (Signed)
History  This chart was scribed for non-physician practitioner, Felipa Furnace, working with Gavin Pound. Oletta Lamas, MD by Ardeen Jourdain, ED Scribe. This patient was seen in room TR09C/TR09C and the patient's care was started at 2027.  CSN: [id number]     Arrival date & time 10/02/12  2001   First MD Initiated Contact with Patient 10/02/12 2027     Chief Complaint  Patient presents with  . Tick Removal    The history is provided by the patient. No language interpreter was used.    HPI Comments: [name] is a 47 y.o. male who presents to the Emergency Department complaining of a tick bite at the back of his neck that occurred 6 days ago. He is currently c/o gradual onset, gradually worsening, constant HA, fever, lack of appetite, fatigue and left shoulder muscle ache. He denies any rashes, cough, nausea and emesis as associated symptoms. He states his coworker removed the tick from his neck 6 days ago.   Past Medical History  Diagnosis Date  . DVT (deep venous thrombosis) 01/08/2011    lt leg  . DVT of lower limb, acute 01/12/2011  . BPH (benign prostatic hyperplasia)   . Current use of long term anticoagulation   . 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 Noncompliance w/medication treatment due to intermit use of medication   . History of tobacco use     QUIT early 2013  . Hypertension   . Depression    Past Surgical History  Procedure Laterality Date  . Tonsillectomy     No family history on file. History  Substance Use Topics  . Smoking status: Former Smoker -- 6 years    Types: Cigars    Quit date: 09/14/2010  . Smokeless tobacco: Never Used  . Alcohol Use: 1.2 oz/week    2 Cans of beer per week     Comment: no alcohol since 03/2012    Review of Systems  Constitutional: Negative for fever and chills.  Musculoskeletal:       Left shoulder aches  Skin:       Tick bite  Neurological: Positive for headaches.   All other systems reviewed and are negative.    Allergies  Review of patient's allergies indicates no known allergies.  Home Medications   Current Outpatient Rx  Name  Route  Sig  Dispense  Refill  . acetaminophen (TYLENOL) 500 MG tablet   Oral   Take 500 mg by mouth every 6 (six) hours as needed for pain.         . cyclobenzaprine (FLEXERIL) 10 MG tablet   Oral   Take 10 mg by mouth 3 (three) times daily as needed for muscle spasms.         . Multiple Vitamin (MULTIVITAMIN WITH MINERALS) TABS   Oral   Take 1 tablet by mouth daily.         . Rivaroxaban (XARELTO) 20 MG TABS   Oral   Take 20 mg by mouth daily with supper.         . Tamsulosin HCl (FLOMAX) 0.4 MG CAPS   Oral   Take 0.4 mg by mouth daily after breakfast.          Triage Vitals: BP 146/89  Pulse 99  Temp(Src) 99.7 F (37.6 C) (Oral)  Resp 18  SpO2 98%  Physical Exam  Nursing note and vitals reviewed. Constitutional: He is oriented to person,  place, and time. He appears well-developed and well-nourished. No distress.  HENT:  Head: Normocephalic and atraumatic.  Eyes: EOM are normal.  Neck: Neck supple. No tracheal deviation present.  Cardiovascular: Normal rate, regular rhythm and normal heart sounds.  Exam reveals no gallop and no friction rub.   No murmur heard. Pulmonary/Chest: Effort normal and breath sounds normal. No respiratory distress. He has no wheezes. He has no rales. He exhibits no tenderness.  Musculoskeletal: Normal range of motion.  Neurological: He is alert and oriented to person, place, and time.  Skin: Skin is warm and dry.  Small rash on back of neck. No bullseye rash  Psychiatric: He has a normal mood and affect. His behavior is normal.    ED Course   Procedures (including critical care time)  DIAGNOSTIC STUDIES: Oxygen Saturation is 98% on room air, normal by my interpretation.    COORDINATION OF CARE:  9:21 PM-Discussed treatment plan which includes  antibiotics and blood culture with pt at bedside and pt agreed to plan.    Labs Reviewed - No data to display No results found. 1. Viral syndrome   2. Tick bite     MDM  Patient with fever, and arthralgias. Temperature is improved in the emergency department with Tylenol. He does report being bitten by a tick, I discussed patient with Dr. Oletta Lamas. Will treat the patient with doxycycline. Patient stable and ready for discharge.  I personally performed the services described in this documentation, which was scribed in my presence. The recorded information has been reviewed and is accurate.    Roxy Horseman, PA-C 10/03/12 0011

## 2012-10-02 NOTE — ED Notes (Signed)
PA at bedside.

## 2012-10-04 LAB — ROCKY MTN SPOTTED FVR AB, IGG-BLOOD: RMSF IgG: 0.4 IV

## 2012-10-04 LAB — B. BURGDORFI ANTIBODIES: B burgdorferi Ab IgG+IgM: 0.47 {ISR}

## 2012-10-04 NOTE — ED Provider Notes (Signed)
Medical screening examination/treatment/procedure(s) were performed by non-physician practitioner and as supervising physician I was immediately available for consultation/collaboration.   Gavin Pound. Noriel Guthrie, MD 10/04/12 2311

## 2013-02-10 ENCOUNTER — Encounter: Payer: Self-pay | Admitting: Vascular Surgery

## 2013-02-11 ENCOUNTER — Emergency Department (HOSPITAL_COMMUNITY)
Admission: EM | Admit: 2013-02-11 | Discharge: 2013-02-11 | Disposition: A | Discharge status: Home / Self Care | Payer: Non-veteran care | Attending: Emergency Medicine | Admitting: Emergency Medicine

## 2013-02-11 ENCOUNTER — Encounter (HOSPITAL_COMMUNITY): Payer: Self-pay | Admitting: Emergency Medicine

## 2013-02-11 ENCOUNTER — Ambulatory Visit: Payer: Self-pay | Admitting: Vascular Surgery

## 2013-02-11 DIAGNOSIS — Z9119 Patient's noncompliance with other medical treatment and regimen: Secondary | ICD-10-CM | POA: Insufficient documentation

## 2013-02-11 DIAGNOSIS — Z79899 Other long term (current) drug therapy: Secondary | ICD-10-CM | POA: Insufficient documentation

## 2013-02-11 DIAGNOSIS — I10 Essential (primary) hypertension: Secondary | ICD-10-CM | POA: Insufficient documentation

## 2013-02-11 DIAGNOSIS — M545 Low back pain, unspecified: Secondary | ICD-10-CM | POA: Insufficient documentation

## 2013-02-11 DIAGNOSIS — Z87891 Personal history of nicotine dependence: Secondary | ICD-10-CM | POA: Insufficient documentation

## 2013-02-11 DIAGNOSIS — Z91199 Patient's noncompliance with other medical treatment and regimen due to unspecified reason: Secondary | ICD-10-CM | POA: Insufficient documentation

## 2013-02-11 DIAGNOSIS — Z7901 Long term (current) use of anticoagulants: Secondary | ICD-10-CM | POA: Insufficient documentation

## 2013-02-11 DIAGNOSIS — N400 Enlarged prostate without lower urinary tract symptoms: Secondary | ICD-10-CM | POA: Insufficient documentation

## 2013-02-11 DIAGNOSIS — Z86718 Personal history of other venous thrombosis and embolism: Secondary | ICD-10-CM | POA: Insufficient documentation

## 2013-02-11 DIAGNOSIS — Z8659 Personal history of other mental and behavioral disorders: Secondary | ICD-10-CM | POA: Insufficient documentation

## 2013-02-11 MED ORDER — CYCLOBENZAPRINE HCL 10 MG PO TABS: 10.0000 mg | ORAL_TABLET | Freq: Two times a day (BID) | ORAL | Status: DC | PRN

## 2013-02-11 MED ORDER — CYCLOBENZAPRINE HCL 10 MG PO TABS
10.0000 mg | ORAL_TABLET | Freq: Once | ORAL | Status: AC
  Administered 2013-02-11: 10 mg via ORAL
  Filled 2013-02-11: qty 1

## 2013-02-11 NOTE — ED Provider Notes (Signed)
Medical screening examination/treatment/procedure(s) were performed by non-physician practitioner and as supervising physician I was immediately available for consultation/collaboration.  EKG Interpretation   None         Kristen N Ward, DO 02/11/13 2357 

## 2013-02-11 NOTE — ED Notes (Signed)
Pt states he wakes up stiff every morning.  Monday morning he woke up with increased stiffness and R lower back pain.  Denies injury, but states he works in [employer] and could have injured it without knowing.

## 2013-02-11 NOTE — ED Provider Notes (Signed)
CSN: [id number]     Arrival date & time 02/11/13  1651 History   First MD Initiated Contact with Patient 02/11/13 1715      This chart was scribed for non-physician practitioner, Francee Piccolo PA-C  working with Layla Maw Ward, DO by Arlan Organ, ED Scribe. This patient was seen in room TR09C/TR09C and the patient's care was started at 5:52 PM.   Chief Complaint  Patient presents with  . Back Pain   The history is provided by the patient. No language interpreter was used.    HPI Comments: [name] is a 47 y.o. male who presents to the Emergency Department complaining of gradual onset, unchanged, constant lower back pain that started 4 days ago. Pt denies any recent falls or injury. He states the pain radiates into his hips periodically. Pt reports doing a significant amount of twisting and turning while at work when he first noted the pain. He rates the pain 8/10. He states OTC pain medication and heat make the pain better, but do not improve the pressure he is feeling. He denies fever, chills, bowel or urinary changes, numbness, or weakness. He denies any h/o cancer. He denies any recent illicit drug or intravenous use.  Past Medical History  Diagnosis Date  . DVT (deep venous thrombosis) 01/08/2011    lt leg  . DVT of lower limb, acute 01/12/2011  . BPH (benign prostatic hyperplasia)   . Current use of long term anticoagulation   . 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 Noncompliance w/medication treatment due to intermit use of medication   . History of tobacco use     QUIT early 2013  . Hypertension   . Depression    Past Surgical History  Procedure Laterality Date  . Tonsillectomy     No family history on file. History  Substance Use Topics  . Smoking status: Former Smoker -- 6 years    Types: Cigars    Quit date: 09/14/2010  . Smokeless tobacco: Never Used  . Alcohol Use: 1.2 oz/week    2 Cans of beer per  week     Comment: no alcohol since 03/2012    Review of Systems  Constitutional: Negative for fever.  Genitourinary: Negative.   Musculoskeletal: Positive for back pain.  Neurological: Negative for weakness and numbness.    Allergies  Review of patient's allergies indicates no known allergies.  Home Medications   Current Outpatient Rx  Name  Route  Sig  Dispense  Refill  . acetaminophen (TYLENOL) 500 MG tablet   Oral   Take 1,000 mg by mouth 3 (three) times daily as needed (pain).          Marland Kitchen aspirin EC 325 MG tablet   Oral   Take 650-975 mg by mouth 3 (three) times daily as needed (pain).         . Cholecalciferol (VITAMIN D PO)   Oral   Take by mouth.         . Menthol, Topical Analgesic, (BENGAY EX)   Apply externally   Apply 1 application topically 4 (four) times daily as needed (pain).         . Multiple Vitamin (MULTIVITAMIN WITH MINERALS) TABS   Oral   Take 1 tablet by mouth daily.         Marland Kitchen PRESCRIPTION MEDICATION   Oral   Take 1 tablet by mouth daily. Cholesterol medication from [place]         .  Rivaroxaban (XARELTO) 20 MG TABS   Oral   Take 20 mg by mouth daily.          . Tamsulosin HCl (FLOMAX) 0.4 MG CAPS   Oral   Take 0.4 mg by mouth daily after breakfast.         . cyclobenzaprine (FLEXERIL) 10 MG tablet   Oral   Take 1 tablet (10 mg total) by mouth 2 (two) times daily as needed for muscle spasms.   20 tablet   0    Triage Vitals: BP 148/75  Pulse 58  Temp(Src) 98.2 F (36.8 C) (Oral)  Resp 18  Wt 190 lb (86.183 kg)  SpO2 95%  Physical Exam  Constitutional: He is oriented to person, place, and time. He appears well-developed and well-nourished. No distress.  HENT:  Head: Normocephalic and atraumatic.  Right Ear: External ear normal.  Left Ear: External ear normal.  Nose: Nose normal.  Mouth/Throat: Oropharynx is clear and moist. No oropharyngeal exudate.  Eyes: Conjunctivae and EOM are normal. Pupils are equal, round,  and reactive to light.  Neck: Normal range of motion. Neck supple.  Cardiovascular: Normal rate, regular rhythm, normal heart sounds and intact distal pulses.   Pulmonary/Chest: Effort normal and breath sounds normal. No respiratory distress.  Abdominal: Soft. There is no tenderness.  Neurological: He is alert and oriented to person, place, and time. He has normal strength. No cranial nerve deficit or sensory deficit. Gait normal. GCS eye subscore is 4. GCS verbal subscore is 5. GCS motor subscore is 6.  No pronator drift. Bilateral heel-knee-shin intact.  Skin: Skin is warm and dry. He is not diaphoretic.    ED Course  Procedures (including critical care time)  DIAGNOSTIC STUDIES: Oxygen Saturation is 95% on RA, Normal by my interpretation.    COORDINATION OF CARE: 5:52 PM- Will give muscle relaxer. Discussed treatment plan with pt at bedside and pt agreed to plan.     Labs Review Labs Reviewed - No data to display Imaging Review No results found.  EKG Interpretation   None       MDM   1. Back pain, lumbosacral     Afebrile, NAD, non-toxic appearing, AAOx4. Patient with back pain.  No neurological deficits and normal neuro exam.  Patient can walk but states is painful.  No loss of bowel or bladder control.  No concern for cauda equina.  No fever, night sweats, weight loss, h/o cancer, IVDU.  RICE protocol and pain medicine indicated and discussed with patient. Return precautions discussed. Patient is agreeable to plan. Patient is stable at time of discharge      I personally performed the services described in this documentation, which was scribed in my presence. The recorded information has been reviewed and is accurate.     Jeannetta Ellis, PA-C 02/11/13 2208

## 2013-02-11 NOTE — ED Notes (Signed)
PT c/o right lower back pain onset 5 days ago.  Pt denies any injury.

## 2013-06-21 ENCOUNTER — Emergency Department (HOSPITAL_COMMUNITY)
Admission: EM | Admit: 2013-06-21 | Discharge: 2013-06-21 | Disposition: A | Discharge status: Home / Self Care | Payer: Non-veteran care | Attending: Emergency Medicine | Admitting: Emergency Medicine

## 2013-06-21 ENCOUNTER — Emergency Department (HOSPITAL_COMMUNITY): Payer: Non-veteran care

## 2013-06-21 DIAGNOSIS — Z7901 Long term (current) use of anticoagulants: Secondary | ICD-10-CM | POA: Insufficient documentation

## 2013-06-21 DIAGNOSIS — Z9089 Acquired absence of other organs: Secondary | ICD-10-CM | POA: Insufficient documentation

## 2013-06-21 DIAGNOSIS — Z87891 Personal history of nicotine dependence: Secondary | ICD-10-CM | POA: Insufficient documentation

## 2013-06-21 DIAGNOSIS — R51 Headache: Secondary | ICD-10-CM

## 2013-06-21 DIAGNOSIS — Z79899 Other long term (current) drug therapy: Secondary | ICD-10-CM | POA: Insufficient documentation

## 2013-06-21 DIAGNOSIS — Z91199 Patient's noncompliance with other medical treatment and regimen due to unspecified reason: Secondary | ICD-10-CM | POA: Insufficient documentation

## 2013-06-21 DIAGNOSIS — Z9119 Patient's noncompliance with other medical treatment and regimen: Secondary | ICD-10-CM | POA: Insufficient documentation

## 2013-06-21 DIAGNOSIS — Z8659 Personal history of other mental and behavioral disorders: Secondary | ICD-10-CM | POA: Insufficient documentation

## 2013-06-21 DIAGNOSIS — J323 Chronic sphenoidal sinusitis: Secondary | ICD-10-CM | POA: Insufficient documentation

## 2013-06-21 DIAGNOSIS — Z86718 Personal history of other venous thrombosis and embolism: Secondary | ICD-10-CM | POA: Insufficient documentation

## 2013-06-21 DIAGNOSIS — I10 Essential (primary) hypertension: Secondary | ICD-10-CM | POA: Insufficient documentation

## 2013-06-21 DIAGNOSIS — Z87448 Personal history of other diseases of urinary system: Secondary | ICD-10-CM | POA: Insufficient documentation

## 2013-06-21 DIAGNOSIS — R519 Headache, unspecified: Secondary | ICD-10-CM

## 2013-06-21 LAB — CBC WITH DIFFERENTIAL/PLATELET
Basophils Absolute: 0 10*3/uL (ref 0.0–0.1)
Basophils Relative: 0 % (ref 0–1)
EOS PCT: 0 % (ref 0–5)
Eosinophils Absolute: 0 10*3/uL (ref 0.0–0.7)
HCT: 42.4 % (ref 39.0–52.0)
HEMOGLOBIN: 15.2 g/dL (ref 13.0–17.0)
LYMPHS ABS: 1.8 10*3/uL (ref 0.7–4.0)
LYMPHS PCT: 22 % (ref 12–46)
MCH: 33.6 pg (ref 26.0–34.0)
MCHC: 35.8 g/dL (ref 30.0–36.0)
MCV: 93.8 fL (ref 78.0–100.0)
MONOS PCT: 9 % (ref 3–12)
Monocytes Absolute: 0.7 10*3/uL (ref 0.1–1.0)
NEUTROS PCT: 69 % (ref 43–77)
Neutro Abs: 5.7 10*3/uL (ref 1.7–7.7)
PLATELETS: 281 10*3/uL (ref 150–400)
RBC: 4.52 MIL/uL (ref 4.22–5.81)
RDW: 12.5 % (ref 11.5–15.5)
WBC: 8.2 10*3/uL (ref 4.0–10.5)

## 2013-06-21 LAB — COMPREHENSIVE METABOLIC PANEL
ALK PHOS: 60 U/L (ref 39–117)
ALT: 21 U/L (ref 0–53)
AST: 22 U/L (ref 0–37)
Albumin: 3.8 g/dL (ref 3.5–5.2)
BUN: 15 mg/dL (ref 6–23)
CO2: 24 meq/L (ref 19–32)
Calcium: 9.2 mg/dL (ref 8.4–10.5)
Chloride: 100 mEq/L (ref 96–112)
Creatinine, Ser: 1.3 mg/dL (ref 0.50–1.35)
GFR, EST AFRICAN AMERICAN: 74 mL/min — AB (ref >90)
GFR, EST NON AFRICAN AMERICAN: 64 mL/min — AB (ref >90)
GLUCOSE: 91 mg/dL (ref 70–99)
POTASSIUM: 4 meq/L (ref 3.7–5.3)
SODIUM: 138 meq/L (ref 137–147)
Total Bilirubin: 0.5 mg/dL (ref 0.3–1.2)
Total Protein: 6.8 g/dL (ref 6.0–8.3)

## 2013-06-21 MED ORDER — PROMETHAZINE HCL 25 MG/ML IJ SOLN
25.0000 mg | Freq: Once | INTRAMUSCULAR | Status: DC
  Filled 2013-06-21 (×2): qty 1

## 2013-06-21 MED ORDER — HYDROMORPHONE HCL PF 1 MG/ML IJ SOLN
1.0000 mg | Freq: Once | INTRAMUSCULAR | Status: DC
  Filled 2013-06-21: qty 1

## 2013-06-21 MED ORDER — TETRACAINE HCL 0.5 % OP SOLN
1.0000 [drp] | Freq: Once | OPHTHALMIC | Status: AC
  Administered 2013-06-21: 1 [drp] via OPHTHALMIC
  Filled 2013-06-21: qty 2

## 2013-06-21 MED ORDER — AMOXICILLIN-POT CLAVULANATE 875-125 MG PO TABS: 1.0000 | ORAL_TABLET | Freq: Two times a day (BID) | ORAL | Status: DC

## 2013-06-21 NOTE — ED Notes (Signed)
Patient transported to CT 

## 2013-06-21 NOTE — ED Notes (Signed)
Patient reports he has a headache that started last night, but unsure of the time.  No history of migraines.  He reports blurry vision and feeling lightheaded.

## 2013-06-21 NOTE — Discharge Instructions (Signed)

## 2013-06-21 NOTE — ED Provider Notes (Signed)
Checked patient's intraocular pressure using Tonopen. Device was calibrated prior to use.   OD- 10 mmHg OS- 8 mmHg  Jeremy SereneMorgan Rogers, MD 06/21/13 [phone]   Please see my complete note.   Hilario Quarryanielle S Milica Gully, MD 06/21/13 [phone]

## 2013-06-21 NOTE — ED Provider Notes (Addendum)
CSN: [id number]     Arrival date & time 06/21/13  1191 History   First MD Initiated Contact with Patient 06/21/13 [phone]     Chief Complaint  Patient presents with  . Headache     (Consider location/radiation/quality/duration/timing/severity/associated sxs/prior Treatment) HPI [name] is a 48 year old male history of DVT on Xarelto who presents today complaining of left sided headache. He states this began gradually after laying down last night. He describes it as sharp and located behind his left eye radiating to the back of his head. He has had some nasal congestion over the past several weeks. He denies fever, head trauma, lateralized weakness, or neck pain. He has not had similar headaches in the past. He states the pain is behind his left eye and light bothers him. He has not had any visual changes. He denies nausea or vomiting. Past Medical History  Diagnosis Date  . DVT (deep venous thrombosis) 01/08/2011    lt leg  . DVT of lower limb, acute 01/12/2011  . BPH (benign prostatic hyperplasia)   . Current use of long term anticoagulation   . 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 Noncompliance w/medication treatment due to intermit use of medication   . History of tobacco use     QUIT early 2013  . Hypertension   . Depression    Past Surgical History  Procedure Laterality Date  . Tonsillectomy     No family history on file. History  Substance Use Topics  . Smoking status: Former Smoker -- 6 years    Types: Cigars    Quit date: 09/14/2010  . Smokeless tobacco: Never Used  . Alcohol Use: 1.2 oz/week    2 Cans of beer per week     Comment: no alcohol since 03/2012    Review of Systems  All other systems reviewed and are negative.     Allergies  Review of patient's allergies indicates no known allergies.  Home Medications   Prior to Admission medications   Medication Sig Start Date End Date Taking? Authorizing  Provider  Cholecalciferol (VITAMIN D PO) Take by mouth.   Yes Historical Provider, MD  cyclobenzaprine (FLEXERIL) 10 MG tablet Take 1 tablet (10 mg total) by mouth 2 (two) times daily as needed for muscle spasms. 02/11/13  Yes Jennifer L Piepenbrink, PA-C  PRESCRIPTION MEDICATION Take 1 tablet by mouth daily. Cholesterol medication from [place]   Yes Historical Provider, MD  Rivaroxaban (XARELTO) 20 MG TABS Take 20 mg by mouth daily.    Yes Historical Provider, MD   BP 135/87  Pulse 73  Temp(Src) 98.2 F (36.8 C) (Oral)  Resp 17  Ht 5\' 8"  (1.727 m)  Wt 185 lb (83.915 kg)  BMI 28.14 kg/m2  SpO2 94% Physical Exam  Nursing note and vitals reviewed. Constitutional: He is oriented to person, place, and time. He appears well-developed and well-nourished.  HENT:  Head: Normocephalic and atraumatic.  Right Ear: Tympanic membrane and external ear normal.  Left Ear: Tympanic membrane and external ear normal.  Nose: Nose normal. Right sinus exhibits no maxillary sinus tenderness and no frontal sinus tenderness. Left sinus exhibits no maxillary sinus tenderness and no frontal sinus tenderness.  Eyes: Conjunctivae and EOM are normal. Pupils are equal, round, and reactive to light. Right eye exhibits no nystagmus. Left eye exhibits no nystagmus.  Neck: Normal range of motion. Neck supple.  Cardiovascular: Normal rate, regular rhythm, normal heart sounds and intact  distal pulses.   Pulmonary/Chest: Effort normal and breath sounds normal. No respiratory distress. He exhibits no tenderness.  Abdominal: Soft. Bowel sounds are normal. He exhibits no distension and no mass. There is no tenderness.  Musculoskeletal: Normal range of motion. He exhibits no edema and no tenderness.  Neurological: He is alert and oriented to person, place, and time. He has normal strength and normal reflexes. No sensory deficit. He displays a negative Romberg sign. GCS eye subscore is 4. GCS verbal subscore is 5. GCS motor subscore is  6.  Reflex Scores:      Tricep reflexes are 2+ on the right side and 2+ on the left side.      Bicep reflexes are 2+ on the right side and 2+ on the left side.      Brachioradialis reflexes are 2+ on the right side and 2+ on the left side.      Patellar reflexes are 2+ on the right side and 2+ on the left side.      Achilles reflexes are 2+ on the right side and 2+ on the left side. Patient with normal gait without ataxia, shuffling, spasm, or antalgia. Speech is normal without dysarthria, dysphasia, or aphasia. Muscle strength is 5/5 in bilateral shoulders, elbow flexor and extensors, wrist flexor and extensors, and intrinsic hand muscles. 5/5 bilateral lower extremity hip flexors, extensors, knee flexors and extensors, and ankle dorsi and plantar flexors.    Skin: Skin is warm and dry. No rash noted.  Psychiatric: He has a normal mood and affect. His behavior is normal. Judgment and thought content normal.    ED Course  Procedures (including critical care time) Labs Review Labs Reviewed  COMPREHENSIVE METABOLIC PANEL - Abnormal; Notable for the following:    GFR calc non Af Amer 64 (*)    GFR calc Af Amer 74 (*)    All other components within normal limits  CBC WITH DIFFERENTIAL    Imaging Review Ct Head Wo Contrast  06/21/2013   CLINICAL DATA:  Headache, blurred vision, and lightheadedness  EXAM: CT HEAD WITHOUT CONTRAST  TECHNIQUE: Contiguous axial images were obtained from the base of the skull through the vertex without intravenous contrast.  COMPARISON:  CT HEAD W/O CM dated 10/08/2011  FINDINGS: The ventricles are normal in size and position. There is no intracranial hemorrhage nor intracranial mass effect. There is no evidence of an evolving ischemic infarction. The cerebellum and brainstem exhibit no acute abnormalities.  At bone window settings there is soft tissue density material within the left sphenoid sinus cell. This is not new but is slightly more conspicuous. The  remainder of the paranasal sinuses and mastoid air cells are clear. There is no evidence of an acute skull fracture.  IMPRESSION: There is no evidence of an acute intracranial hemorrhage nor of an evolving ischemic event. There is no intracranial mass effect. There is soft tissue density material in the left sphenoid sinus which is not new which likely reflects chronic sinusitis.   Electronically Signed   By: David  Swazil[place]   On: 06/21/2013 07:59     EKG Interpretation None      MDM   Final diagnoses:  Sphenoidal sinusitis  Headache  Chronic anticoagulation   Patient seen and evaluated with headache behind left eye with differential diagnosis of intracranial hemorrhage, infection, increased intraocular pressure. Intraocular pressure obtained by Rocco SereneMorgan Rogers M.D. please see her note. Dropped her pressures were normal with OD 10 and OS 8.  CT obtained  and does not show any evidence of intracranial hemorrhage. Does show evidence of soft tissue density in the left sphenoid sinus likely reflecting sinusitis. The patient has a normal neurological exam without fever or elevated white blood cell count and has no concerning symptoms of meningitis. He has improved spontaneously here without medication. Plan antibiotics and patient advised regarding return precautions and need for close followup and voices understanding.    Jeremy Quarryanielle S Gad Aymond, MD 06/21/13 [phone]  Jeremy Quarryanielle S Shanique Aslinger, MD 06/21/13 [phone]

## 2013-08-09 ENCOUNTER — Emergency Department (HOSPITAL_COMMUNITY): Payer: Non-veteran care

## 2013-08-09 ENCOUNTER — Emergency Department (HOSPITAL_COMMUNITY)
Admission: EM | Admit: 2013-08-09 | Discharge: 2013-08-09 | Disposition: A | Discharge status: Home / Self Care | Payer: Non-veteran care | Attending: Emergency Medicine | Admitting: Emergency Medicine

## 2013-08-09 ENCOUNTER — Encounter (HOSPITAL_COMMUNITY): Payer: Self-pay | Admitting: Emergency Medicine

## 2013-08-09 DIAGNOSIS — Z8659 Personal history of other mental and behavioral disorders: Secondary | ICD-10-CM | POA: Insufficient documentation

## 2013-08-09 DIAGNOSIS — Z86718 Personal history of other venous thrombosis and embolism: Secondary | ICD-10-CM | POA: Insufficient documentation

## 2013-08-09 DIAGNOSIS — N400 Enlarged prostate without lower urinary tract symptoms: Secondary | ICD-10-CM | POA: Insufficient documentation

## 2013-08-09 DIAGNOSIS — I742 Embolism and thrombosis of arteries of the upper extremities: Secondary | ICD-10-CM | POA: Insufficient documentation

## 2013-08-09 DIAGNOSIS — Z9119 Patient's noncompliance with other medical treatment and regimen: Secondary | ICD-10-CM | POA: Insufficient documentation

## 2013-08-09 DIAGNOSIS — M545 Low back pain, unspecified: Secondary | ICD-10-CM | POA: Insufficient documentation

## 2013-08-09 DIAGNOSIS — Z7901 Long term (current) use of anticoagulants: Secondary | ICD-10-CM | POA: Insufficient documentation

## 2013-08-09 DIAGNOSIS — Z91199 Patient's noncompliance with other medical treatment and regimen due to unspecified reason: Secondary | ICD-10-CM | POA: Insufficient documentation

## 2013-08-09 DIAGNOSIS — Z79899 Other long term (current) drug therapy: Secondary | ICD-10-CM | POA: Insufficient documentation

## 2013-08-09 DIAGNOSIS — M549 Dorsalgia, unspecified: Secondary | ICD-10-CM

## 2013-08-09 DIAGNOSIS — Z87891 Personal history of nicotine dependence: Secondary | ICD-10-CM | POA: Insufficient documentation

## 2013-08-09 DIAGNOSIS — I10 Essential (primary) hypertension: Secondary | ICD-10-CM | POA: Insufficient documentation

## 2013-08-09 HISTORY — DX: Benign prostatic hyperplasia without lower urinary tract symptoms: N40.0

## 2013-08-09 MED ORDER — PREDNISONE 20 MG PO TABS
40.0000 mg | ORAL_TABLET | Freq: Every day | ORAL | Status: DC
Start: 2013-08-09 — End: 2013-08-25

## 2013-08-09 MED ORDER — OXYCODONE-ACETAMINOPHEN 5-325 MG PO TABS: 1.0000 | ORAL_TABLET | ORAL | Status: DC | PRN

## 2013-08-09 MED ORDER — OXYCODONE-ACETAMINOPHEN 5-325 MG PO TABS
1.0000 | ORAL_TABLET | Freq: Once | ORAL | Status: AC
Start: 2013-08-09 — End: 2013-08-09
  Administered 2013-08-09: 1 via ORAL
  Filled 2013-08-09: qty 1

## 2013-08-09 NOTE — ED Provider Notes (Signed)
CSN: [id number]     Arrival date & time 08/09/13  1234 History  This chart was scribed for non-physician practitioner Sharilyn SitesLisa Gordie Belvin, PA-C working with Doug SouSam Jacubowitz, MD by Leone PayorSonum Patel, ED Scribe. This patient was seen in room TR09C/TR09C and the patient's care was started at 1:57 PM.    Chief Complaint  Patient presents with  . Back Pain      The history is provided by the patient. No language interpreter was used.    HPI Comments: [name] is a 48 y.o. male who presents to the Emergency Department complaining of constant, unchanged lower back pain for the past 1 month. He states the pain is worse with movement and occasionally radiates to the right buttocks and down the back of the RLE. He denies history of back surgeries. He denies numbness, weakness, or paresthesias of LE.  No loss of bowel or bladder control.  Has been taking tylenol and motrin at home without noted improvement.  Past Medical History  Diagnosis Date  . DVT (deep venous thrombosis) 01/08/2011    lt leg  . DVT of lower limb, acute 01/12/2011  . BPH (benign prostatic hyperplasia)   . Current use of long term anticoagulation   . 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 Noncompliance w/medication treatment due to intermit use of medication   . History of tobacco use     QUIT early 2013  . Hypertension   . Depression   . Enlarged prostate    Past Surgical History  Procedure Laterality Date  . Tonsillectomy     History reviewed. No pertinent family history. History  Substance Use Topics  . Smoking status: Former Smoker -- 6 years    Types: Cigars    Quit date: 09/14/2010  . Smokeless tobacco: Never Used  . Alcohol Use: 1.2 oz/week    2 Cans of beer per week     Comment: no alcohol since 03/2012    Review of Systems  Musculoskeletal: Positive for back pain. Negative for gait problem.  Neurological: Negative for weakness and numbness.      Allergies   Review of patient's allergies indicates no known allergies.  Home Medications   Prior to Admission medications   Medication Sig Start Date End Date Taking? Authorizing Provider  amoxicillin-clavulanate (AUGMENTIN) 875-125 MG per tablet Take 1 tablet by mouth 2 (two) times daily. 06/21/13   Hilario Quarryanielle S Ray, MD  Cholecalciferol (VITAMIN D PO) Take by mouth.    Historical Provider, MD  cyclobenzaprine (FLEXERIL) 10 MG tablet Take 1 tablet (10 mg total) by mouth 2 (two) times daily as needed for muscle spasms. 02/11/13   Jennifer L Piepenbrink, PA-C  PRESCRIPTION MEDICATION Take 1 tablet by mouth daily. Cholesterol medication from [facility]    Historical Provider, MD  Rivaroxaban (XARELTO) 20 MG TABS Take 20 mg by mouth daily.     Historical Provider, MD   BP 135/72  Pulse 81  Temp(Src) 98.4 F (36.9 C) (Oral)  Resp 18  Wt 176 lb 1 oz (79.861 kg)  SpO2 97%  Physical Exam  Nursing note and vitals reviewed. Constitutional: He is oriented to person, place, and time. He appears well-developed and well-nourished.  HENT:  Head: Normocephalic and atraumatic.  Mouth/Throat: Oropharynx is clear and moist.  Eyes: Conjunctivae and EOM are normal. Pupils are equal, round, and reactive to light.  Neck: Normal range of motion.  Cardiovascular: Normal rate, regular rhythm and normal heart  sounds.   Pulmonary/Chest: Effort normal and breath sounds normal. No respiratory distress. He has no wheezes.  Musculoskeletal: Normal range of motion. He exhibits tenderness.       Lumbar back: He exhibits tenderness and bony tenderness.       Back:  Tenderness of right SI joint without bony deformity. +right  SLR. Distal sensation intact. Ambulating without difficulty.   Neurological: He is alert and oriented to person, place, and time.  Skin: Skin is warm and dry.  Psychiatric: He has a normal mood and affect.    ED Course  Procedures (including critical care time)  DIAGNOSTIC STUDIES: Oxygen Saturation is 97%  on RA, adequate by my interpretation.    COORDINATION OF CARE: 1:57 PM Will order lumbar spine XRAY. Discussed treatment plan with pt at bedside and pt agreed to plan.   Labs Review Labs Reviewed - No data to display  Imaging Review Dg Lumbar Spine Complete  08/09/2013   CLINICAL DATA:  One month history of lower back pain progressive over the past 2-3 days. No known injury.  EXAM: LUMBAR SPINE - COMPLETE 4+ VIEW  COMPARISON:  None.  FINDINGS: No acute fracture or malalignment. Vertebral body heights are maintained. Degenerative endplate spurring at L4 anteriorly. Mild facet arthropathy at L5-S1. Normal bony mineralization. No lytic of blastic osseous lesion. No significant sclerosis or erosive changes involving either sacroiliac joint. The visualized bowel gas pattern is unremarkable.  IMPRESSION: 1. No acute fracture or malalignment. 2. Mild degenerative change with endplate spurring at L4 and L5-S1 facet arthropathy.   Electronically Signed   By: Malachy Moan M.D.   On: 08/09/2013 14:47     EKG Interpretation None      MDM   Final diagnoses:  Back pain   48 year old male with right lower back pain ongoing for the past month. No noted injury, trauma, or falls. Patient was without red flag symptoms were focal neurologic deficits on exam. Some tenderness of right SI joint, pain seems sciatic nerve related. Patient has never had prior imaging of his back so will obtain x-ray of LS.  Imaging negative for acute fracture subluxation, noted endplate spurring at L4 and L5 which may be contributing to his pain. [facility] He was started on Percocet and prednisone for pain control. He was encouraged to [facility] closely [facility], copies of x-ray given.  Discussed plan with patient, he/she acknowledged understanding and agreed with plan of care.  Return precautions given for new or worsening symptoms.  I personally performed the services described in this  documentation, which was scribed in my presence. The recorded information has been reviewed and is accurate.  Garlon Hatchet, PA-C 08/09/13 1553

## 2013-08-09 NOTE — Discharge Instructions (Signed)
Take the prescribed medication as directed. Follow-up with your primary care physician.  Copies of x-ray attached for their review. Return to the ED for new or worsening symptoms.

## 2013-08-09 NOTE — ED Provider Notes (Signed)
Medical screening examination/treatment/procedure(s) were performed by non-physician practitioner and as supervising physician I was immediately available for consultation/collaboration.   EKG Interpretation None       Doug Sou, MD 08/09/13 1605

## 2013-08-09 NOTE — ED Notes (Signed)
Pt reports having a "knot" on his lower back near his tailbone x 1 month. Reports it causes pain at all times but increases with movement. Ambulatory at triage, no acute distress noted.

## 2013-08-25 ENCOUNTER — Encounter (HOSPITAL_COMMUNITY): Payer: Self-pay | Admitting: Emergency Medicine

## 2013-08-25 ENCOUNTER — Emergency Department (HOSPITAL_COMMUNITY)
Admission: EM | Admit: 2013-08-25 | Discharge: 2013-08-25 | Disposition: A | Discharge status: Home / Self Care | Payer: Non-veteran care | Attending: Emergency Medicine | Admitting: Emergency Medicine

## 2013-08-25 DIAGNOSIS — M5430 Sciatica, unspecified side: Secondary | ICD-10-CM | POA: Insufficient documentation

## 2013-08-25 DIAGNOSIS — Z791 Long term (current) use of non-steroidal anti-inflammatories (NSAID): Secondary | ICD-10-CM | POA: Insufficient documentation

## 2013-08-25 DIAGNOSIS — Z86718 Personal history of other venous thrombosis and embolism: Secondary | ICD-10-CM | POA: Insufficient documentation

## 2013-08-25 DIAGNOSIS — I10 Essential (primary) hypertension: Secondary | ICD-10-CM | POA: Insufficient documentation

## 2013-08-25 DIAGNOSIS — Z8669 Personal history of other diseases of the nervous system and sense organs: Secondary | ICD-10-CM | POA: Insufficient documentation

## 2013-08-25 DIAGNOSIS — N400 Enlarged prostate without lower urinary tract symptoms: Secondary | ICD-10-CM | POA: Insufficient documentation

## 2013-08-25 DIAGNOSIS — Z7901 Long term (current) use of anticoagulants: Secondary | ICD-10-CM | POA: Insufficient documentation

## 2013-08-25 DIAGNOSIS — Z8659 Personal history of other mental and behavioral disorders: Secondary | ICD-10-CM | POA: Insufficient documentation

## 2013-08-25 DIAGNOSIS — Z79899 Other long term (current) drug therapy: Secondary | ICD-10-CM | POA: Insufficient documentation

## 2013-08-25 DIAGNOSIS — F17200 Nicotine dependence, unspecified, uncomplicated: Secondary | ICD-10-CM | POA: Insufficient documentation

## 2013-08-25 DIAGNOSIS — M5431 Sciatica, right side: Secondary | ICD-10-CM

## 2013-08-25 MED ORDER — NAPROXEN 500 MG PO TABS: 500.0000 mg | ORAL_TABLET | Freq: Two times a day (BID) | ORAL | Status: DC

## 2013-08-25 MED ORDER — OXYCODONE-ACETAMINOPHEN 5-325 MG PO TABS
2.0000 | ORAL_TABLET | Freq: Once | ORAL | Status: AC
  Administered 2013-08-25: 2 via ORAL
  Filled 2013-08-25: qty 2

## 2013-08-25 MED ORDER — OXYCODONE-ACETAMINOPHEN 5-325 MG PO TABS: 2.0000 | ORAL_TABLET | ORAL | Status: DC | PRN

## 2013-08-25 MED ORDER — CYCLOBENZAPRINE HCL 5 MG PO TABS: 5.0000 mg | ORAL_TABLET | Freq: Three times a day (TID) | ORAL | Status: DC | PRN

## 2013-08-25 NOTE — Discharge Instructions (Signed)
Take flexeril (muscle relaxer) at night for pain - Please be careful with this medication.  It can cause drowsiness.  Use caution while driving, operating machinery, drinking alcohol, or any other activities that may impair your physical or mental abilities.   Take naprosyn OR Ibuprofen for mild-moderate pain - take with food  Take percocet as needed for severe pain - Please be careful with this medication.  It can cause drowsiness.  Use caution while driving, operating machinery, drinking alcohol, or any other activities that may impair your physical or mental abilities.   Be careful not to take percocet with flexeril - this may make you drowsy  Return to the emergency department if you develop any changing/worsening condition, weakness, fever, loss of bowel/bladder function, or any other concerns (please read additional information regarding your condition below)  Back Pain, Adult Back pain is very common. The pain often gets better over time. The cause of back pain is usually not dangerous. Most people can learn to manage their back pain on their own.  HOME CARE   Stay active. Start with short walks on flat ground if you can. Try to walk farther each day.  Do not sit, drive, or stand in one place for more than 30 minutes. Do not stay in bed.  Do not avoid exercise or work. Activity can help your back heal faster.  Be careful when you bend or lift an object. Bend at your knees, keep the object close to you, and do not twist.  Sleep on a firm mattress. Lie on your side, and bend your knees. If you lie on your back, put a pillow under your knees.  Only take medicines as told by your doctor.  Put ice on the injured area.  Put ice in a plastic bag.  Place a towel between your skin and the bag.  Leave the ice on for 15-20 minutes, 03-04 times a day for the first 2 to 3 days. After that, you can switch between ice and heat packs.  Ask your doctor about back exercises or massage.  Avoid  feeling anxious or stressed. Find good ways to deal with stress, such as exercise. GET HELP RIGHT AWAY IF:   Your pain does not go away with rest or medicine.  Your pain does not go away in 1 week.  You have new problems.  You do not feel well.  The pain spreads into your legs.  You cannot control when you poop (bowel movement) or pee (urinate).  Your arms or legs feel weak or lose feeling (numbness).  You feel sick to your stomach (nauseous) or throw up (vomit).  You have belly (abdominal) pain.  You feel like you may pass out (faint). MAKE SURE YOU:   Understand these instructions.  Will watch your condition.  Will get help right away if you are not doing well or get worse. Document Released: 08/13/2007 Document Revised: 05/19/2011 Document Reviewed: 07/15/2010 [facility] Patient Information 2015 [place], [place]. This information is not intended to replace advice given to you by your health care provider. Make sure you discuss any questions you have with your health care provider.   Emergency Department Resource Guide 1) Find a Doctor and Pay Out of Pocket Although you won't have to find out who is covered by your insurance plan, it is a good idea to ask around and get recommendations. You will then need to call the office and see if the doctor you have chosen will accept you as  a new patient and what types of options they offer for patients who are self-pay. Some doctors offer discounts or will set up payment plans for their patients who do not have insurance, but you will need to ask so you aren't surprised when you get to your appointment.  2) Contact Your Local Health Department Not all health departments have doctors that can see patients for sick visits, but many do, so it is worth a call to see if yours does. If you don't know where your local health department is, you can check in your phone book. The CDC also has a tool to help you locate your state's health department,  and many state websites also have listings of all of their local health departments.  3) Find a Walk-in Clinic If your illness is not likely to be very severe or complicated, you may want to try a walk in clinic. These are popping up all over the country in pharmacies, drugstores, and shopping centers. They're usually staffed by nurse practitioners or physician assistants that have been trained to treat common illnesses and complaints. They're usually fairly quick and inexpensive. However, if you have serious medical issues or chronic medical problems, these are probably not your best option.  No Primary Care Doctor: - Call Health Connect at  [phone] - they can help you locate a primary care doctor that  accepts your insurance, provides certain services, etc. - Physician Referral Service- [phone]  Chronic Pain Problems: Organization         Address  Phone   Notes  [facility]  [phone] Patients need to be referred by their primary care doctor.   Medication Assistance: Organization         Address  Phone   Notes  [facility] Medication [facility] [address] [place], [place] [address] [phone] --Must be a resident of [facility] [place] -- Must have NO insurance coverage whatsoever (no Medicaid/ Medicare, etc.) -- The pt. MUST have a primary care doctor that directs their care regularly and follows them in the community   MedAssist  [phone]   [employer]  [phone]    Agencies that provide inexpensive medical care: Organization         Address  Phone   Notes  [facility]  [phone]   [facility]    [phone]   [facility] [address] [place], [place] [address] [phone]   [facility] [address], [place] [phone]) [phone]   Planned Parenthood    [phone]) [phone]   [facility]    [phone]   Community  Health and [facility]  [address], [place] Phone:  [phone], Fax:  [phone] Hours of Operation:  9 am - 6 pm, M-F.  Also accepts Medicaid/Medicare and self-pay.  [facility]  [address], [place] Phone: [phone], Fax: [phone]. Hours of Operation:  8:30 am - 5:30 pm, M-F.  Also accepts Medicaid and self-pay.  [facility] [place] [address], [place] Phone: [phone]   [facility] [address], [place], [place] [phone], Ext. [phone] Mondays & Thursdays: 7-9 AM.  First 15 patients are seen on a first come, first serve basis.    Medicaid-accepting [facility] Providers:  Organization  Address  Phone   Notes  [facility] [address], [place] [phone] Also accepts self-pay patients.  [facility] [address] Laurell Josephs [place], [place]  [phone]   [facility] [address], [place] [phone]   [facility] [address] [place] [phone]   Renaye Rakers [address], [place]   [phone] Only accepts [place] patients after they have their name applied to their card.   Self-Pay (no insurance) in [facility]:  Organization         Address  Phone   Notes  Sickle Cell Patients, [facility] [address] [place], [place] [phone]   [facility] [address] [place], [place] [phone]   [facility]  [address], [place] [phone]   [facility]/Dr. Osei-Bonsu  [address], [place] or [address], [place] [phone] Phone number for both [place] and [place] locations is the same.  [facility] [address] [place] [phone]   [facility] [address] [place] or [address] [phone]   [facility] [address] [place] [phone], phone; [phone], fax Sees patients 1st and 3rd Saturday of every month.  Must not qualify for public or private insurance (i.e. Medicaid, Medicare, [place] Health Choice, Veterans' Benefits)  Household income should be no more than 200% of the poverty level The clinic cannot treat you if you are pregnant or think you are pregnant  Sexually transmitted diseases are not treated at the clinic.    Dental Care: Organization         Address  Phone  Notes  [facility] [address] [place], [place] [phone] Accepts children up to age 22 who are enrolled in [place] or [place] Health Choice; pregnant women with a Medicaid card; and children who have applied for Medicaid or [place] Health Choice, but were declined, whose parents can pay a reduced fee at time of service.  [facility] Department of [facility]  [address], [place] [phone] Accepts children up to age 64 who are enrolled in [place] or [place] Health Choice; pregnant women with a Medicaid card; and children who have applied for Medicaid or [place] Health Choice, but were declined, whose parents can pay a reduced fee at time of service.  [facility]  [address] [place], [place] [phone] Patients are seen by appointment only. Walk-ins are not accepted. [facility] will see patients 34 years of age and older. Monday - Tuesday (8am-5pm) Most Wednesdays (8:30-5pm) $30 per visit, cash only  [facility] Adult Dental Access PROGRAM  [address], [facility] [phone] Patients are seen by appointment only. Walk-ins are not accepted. [facility] will see patients 77 years of age and older. One Wednesday Evening (Monthly: Volunteer Based).  $30 per visit,  cash only  [employer]  [phone] for adults; Children under age 106, call Graduate Pediatric Dentistry at [phone]. Children aged 72-14, please call [phone] to request a pediatric application.  Dental services are provided in all areas of dental care including  fillings, crowns and bridges, complete and partial dentures, implants, gum treatment, root canals, and extractions. Preventive care is also provided. Treatment is provided to both adults and children. Patients are selected via a lottery and there is often a waiting list.   [facility] [address], [place]  [phone] [email or web address]   [facility] [address], [place], [place] [phone], Ext. [phone] Second and Fourth Thursday of each month, opens at 6:30 AM; Clinic ends at 9 AM.  Patients are seen on a first-come first-served basis, and a limited number are seen during each clinic.   [facility]  [address], [place], [place] [phone]   Eligibility Requirements You must have lived in [place], [place], or [place] for at least the last three months.   You cannot be eligible for state or federal sponsored National Cityhealthcare insurance, including CIGNAVeterans Administration, [place]icaid, or [employer]Medicare.   You generally cannot be eligible for healthcare insurance through your employer.    How to apply: Eligibility screenings are held every Tuesday and Wednesday afternoon from 1:00 pm until 4:00 pm. You do not need an appointment for the interview!  [facility] [address], [place], [place] [phone]   [facility]  [phone]   [facility]  [phone]   [facility]  [phone]    Behavioral Health Resources in the Community: Intensive Outpatient Programs Organization         Address  Phone  Notes  [facility] [address],  [place], [place] [phone]   [facility] [address], [place], [place] [phone]   [facility] [address], [place], [place]  [phone]   [facility] [address],  [place], [place] [phone] or [phone]   Substance Abuse Resources Organization         Address  Phone  Notes  [facility]  [phone]   [facility]  [phone]   [facility]  [phone]   [facility]  [phone]   Residential & Outpatient Substance Abuse Program  [phone]   Psychological Services Organization         Address  Phone  Notes  [facility]  [phone]- [phone]   [facility]  [phone]- [phone]   [facility] [address], [place] [phone] or [phone]    Mobile Crisis Teams Organization         Address  Phone  Notes  [facility], Mobile Crisis Care Unit  [phone]   [facility]  [address] [place], [place] [phone]   Doristine LocksSharon DeEsch [address] [place] [phone]    Self-Help/Support Groups Organization         Address  Phone             Notes  [facility] [place] - variety of support groups  336- [phone] Call for more information  Narcotics Anonymous (NA), Caring [facility] [address], [place]  2 meetings at this location   Statisticianesidential Treatment Programs Organization         Address  Phone  Notes  [facility] [address],    [place]  [phone]   [facility]  [address], [place] [phone], [place], [place] [phone]   [facility] [address], [place] [phone] Admissions: 8am-3pm M-F  [facility] [address],    [place], [place] [phone]   [facility] [address] [place], [place], [place] [phone]   [facility] [address]  [place], [place] [phone]   [facility] - Intensive Outpatient [address], [place], [place] [phone]   [facility] ([facility] [address] [place] [address]  [place], [place] [phone] or [phone]   [facility] ([facility]) [address], [place], [place] [phone] Accepts Medicaid  [facility] [place] [address],  [place] [phone] Substance Abuse/Addiction Treatment   [facility] Organization         Address  Phone  Notes  [facility]  [phone]   Angie Fava, PhD [address] Ervin Knack [place], [place]   [phone] or [phone]   [facility]   [address] [place], [place] [phone]   [facility] [address] [place], [place] [phone] Insurance/Medicaid/sponsorship through [facility] Families [address]                                    [place], [place] [phone] Therapy/tele-psych/case  [facility] [address], [place] [phone]    Dr. Lolly Mustache  [phone]   [facility]  [employer] [facility] 1) [address], [place] 2) [address], [place] 3)  [address], [place] [phone]  [phone]   [facility] Child Abuse Hotline [phone] [phone] or [phone] (After Hours)

## 2013-08-25 NOTE — ED Provider Notes (Signed)
CSN: [id number]     Arrival date & time 08/25/13  4098 History   First MD Initiated Contact with Patient 08/25/13 [phone]     Chief Complaint  Patient presents with  . Back Pain   HPI  [name] is a 48 y.o. male with a PMH of DVT, BPH, arterial embolism, HTN, medication non-compliance and depression who presents to the ED for evaluation of back pain. History was provided by the patient. Patient complains of lower right back pain for the past few months. Pain is unchanged intermittent and located in his right lower back with radiation down his right leg. Pain is described as an aching pain worse with movement. He previously was seen in the ED for similar pain on 08/09/13 and was prescribed oxycodone and prednisone which improved his pain temporarily. He denies any new injuries or trauma. 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 He is currently awaiting approval from the [place] to see [facility]. He denies any loss of bowel/bladder function, dysuria, difficulty with urination, abdominal pain, nausea, emesis, weakness, loss of sensation, numbness/tingling, fever, chills, chest pain, SOB, or other concerns. Denies hx of IV drug use or cancer. No leg edema or pain. Has been taking xarelto with no missed doses. On xarelto for hx of DVT.    Past Medical History  Diagnosis Date  . DVT (deep venous thrombosis) 01/08/2011    lt leg  . DVT of lower limb, acute 01/12/2011  . BPH (benign prostatic hyperplasia)   . Current use of long term anticoagulation   . 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 Noncompliance w/medication treatment due to intermit use of medication   . History of tobacco use     QUIT early 2013  . Hypertension   . Depression   . Enlarged prostate    Past Surgical History  Procedure Laterality Date  . Tonsillectomy     History  reviewed. No pertinent family history. History  Substance Use Topics  . Smoking status: Current Every Day Smoker -- 6 years    Types: Cigars    Last Attempt to Quit: 09/14/2010  . Smokeless tobacco: Never Used  . Alcohol Use: 1.2 oz/week    2 Cans of beer per week     Comment: no alcohol since 03/2012    Review of Systems  Constitutional: Negative for fever, chills, activity change, appetite change and fatigue.  HENT: Negative for congestion, rhinorrhea and sore throat.   Respiratory: Negative for cough and shortness of breath.   Cardiovascular: Negative for chest pain and leg swelling.  Gastrointestinal: Negative for nausea, vomiting, abdominal pain, diarrhea and constipation.  Genitourinary: Negative for dysuria, urgency, decreased urine volume and difficulty urinating.  Musculoskeletal: Positive for back pain. Negative for arthralgias, joint swelling, myalgias and neck pain.  Skin: Negative for wound.  Neurological: Negative for dizziness, weakness, light-headedness, numbness and headaches.     Allergies  Review of patient's allergies indicates no known allergies.  Home Medications   Prior to Admission medications   Medication Sig Start Date End Date Taking? Authorizing Provider  acetaminophen (TYLENOL) 650 MG CR tablet Take 1,300 mg by mouth 3 (three) times daily as needed for pain.   Yes Historical Provider, MD  Cholecalciferol (VITAMIN D PO) Take 1 tablet by mouth daily.    Yes Historical Provider, MD  PRESCRIPTION MEDICATION Take 1 tablet by mouth daily. [place] from [place]   Yes Historical Provider, MD  Rivaroxaban (XARELTO) 20 MG TABS Take 20 mg by mouth daily.    Yes Historical Provider, MD  tamsulosin (FLOMAX) 0.4 MG CAPS capsule Take 0.4 mg by mouth daily.   Yes Historical Provider, MD   BP 140/86  Pulse 56  Temp(Src) 97.9 F (36.6 C) (Oral)  Resp 17  Wt 175 lb (79.379 kg)  SpO2 100%  Filed Vitals:   08/25/13 0715 08/25/13 0730 08/25/13 0745  08/25/13 0804  BP: 129/78 140/85 149/84   Pulse: 52 46 52   Temp:    97.7 F (36.5 C)  TempSrc:    Oral  Resp:    16  Weight:      SpO2: 96% 92% 97%     Physical Exam  Nursing note and vitals reviewed. Constitutional: He is oriented to person, place, and time. He appears well-developed and well-nourished. No distress.  HENT:  Head: Normocephalic and atraumatic.  Right Ear: External ear normal.  Left Ear: External ear normal.  Nose: Nose normal.  Mouth/Throat: Oropharynx is clear and moist.  Eyes: Conjunctivae are normal. Right eye exhibits no discharge. Left eye exhibits no discharge.  Neck: Normal range of motion. Neck supple.  Cardiovascular: Normal rate, regular rhythm, normal heart sounds and intact distal pulses.  Exam reveals no gallop and no friction rub.   No murmur heard. Posterior tibial pulses present and equal bilaterally  Pulmonary/Chest: Effort normal and breath sounds normal. No respiratory distress. He has no wheezes. He has no rales. He exhibits no tenderness.  Abdominal: Soft. He exhibits no distension. There is no tenderness.  Musculoskeletal: Normal range of motion. He exhibits tenderness. He exhibits no edema.       Arms: Tenderness to palpation to the right lower paraspinal muscles diffusely. No lumbar spinal tenderness. Positive straight leg raise on the right. Strength 5/5 in the upper and lower extremities bilaterally. Patient able to ambulate without difficulty or ataxia. No leg edema bilaterally. No tenderness to palpation to the LE throughout.   Neurological: He is alert and oriented to person, place, and time.  Patellar reflexes intact bilaterally. Gross sensation intact in the LE bilaterally  Skin: Skin is warm and dry. He is not diaphoretic.  No erythema, ecchymosis or lacerations to the LE throughout    ED Course  Procedures (including critical care time) Labs Review Labs Reviewed - No data to display  Imaging Review No results found.   EKG  Interpretation None      MDM   [name] is a 48 y.o. male with a PMH of DVT, BPH, arterial embolism, HTN, and depression who presents to the ED for evaluation of back pain. Etiology of back pain likely due to chronic degenerative changes. Patient recently had x-rays (08/09/13) which showed mild degenerative change with endplate spurring at L4 and L5-S1 facet arthropathy. [facility]. No warning signs or symptoms of back pain including loss of bowel or bladder control, night sweats, waking from sleep with back pain, unexplained fevers or weight loss, history of cancer, or IV drug use. No concern for cauda equina, epidural abscess, or other serious/life threatening cause of back pain. Patient neurovascularly intact with no focal neurological deficits. Patient afebrile and non-toxic in appearance. Vital signs stable. Patient has had flexeril and naprosyn in the past with no adverse reactions. Return precautions, discharge instructions,  and follow-up was discussed with the patient before discharge.     New Prescriptions   CYCLOBENZAPRINE (FLEXERIL) 5 MG TABLET    Take 1 tablet (5 mg total) by mouth 3 (three) times daily as needed for muscle spasms.   NAPROXEN (NAPROSYN) 500 MG TABLET    Take 1 tablet (500 mg total) by mouth 2 (two) times daily with a meal.   OXYCODONE-ACETAMINOPHEN (PERCOCET/ROXICET) 5-325 MG PER TABLET    Take 2 tablets by mouth every 4 (four) hours as needed for severe pain.     Final impressions: 1. Back pain with right-sided sciatica       Luiz IronJessica Katlin Palmer PA-C           Jillyn LedgerJessica K Palmer, PA-C 08/25/13 [phone]

## 2013-08-25 NOTE — ED Provider Notes (Signed)
Medical screening examination/treatment/procedure(s) were conducted as a shared visit with non-physician practitioner(s) and myself.  I personally evaluated the patient during the encounter.   EKG Interpretation None      I interviewed and examined the patient. Lungs are CTAB. Cardiac exam wnl. Abdomen soft.  No BP redflags Has had similar sx previously. Will tx symptomatically.   Junius ArgyleForrest S Harrison, MD 08/25/13 2211

## 2013-08-25 NOTE — ED Notes (Signed)
Pt states seen a week ago for same pain. Pt states that he has known bone spurs and [facility] an othropedic MD to have the spurs removed. Pt states back pain that radiates down his right leg. Pt usually takes 650 arthritis pain medication and was given percocet prescription which worked but he was sleepy.

## 2013-09-02 ENCOUNTER — Emergency Department (HOSPITAL_COMMUNITY)
Admission: EM | Admit: 2013-09-02 | Discharge: 2013-09-03 | Disposition: A | Discharge status: Other Acute Inpatient Hospital | Payer: Non-veteran care | Source: Home / Self Care

## 2013-09-02 ENCOUNTER — Encounter (HOSPITAL_COMMUNITY): Payer: Self-pay | Admitting: Emergency Medicine

## 2013-09-02 DIAGNOSIS — Z86718 Personal history of other venous thrombosis and embolism: Secondary | ICD-10-CM | POA: Insufficient documentation

## 2013-09-02 DIAGNOSIS — F329 Major depressive disorder, single episode, unspecified: Secondary | ICD-10-CM | POA: Insufficient documentation

## 2013-09-02 DIAGNOSIS — Z87891 Personal history of nicotine dependence: Secondary | ICD-10-CM

## 2013-09-02 DIAGNOSIS — F1410 Cocaine abuse, uncomplicated: Secondary | ICD-10-CM

## 2013-09-02 DIAGNOSIS — F1910 Other psychoactive substance abuse, uncomplicated: Secondary | ICD-10-CM

## 2013-09-02 DIAGNOSIS — Z86711 Personal history of pulmonary embolism: Secondary | ICD-10-CM | POA: Insufficient documentation

## 2013-09-02 DIAGNOSIS — F1110 Opioid abuse, uncomplicated: Secondary | ICD-10-CM | POA: Insufficient documentation

## 2013-09-02 DIAGNOSIS — I10 Essential (primary) hypertension: Secondary | ICD-10-CM

## 2013-09-02 DIAGNOSIS — F32A Depression, unspecified: Secondary | ICD-10-CM

## 2013-09-02 DIAGNOSIS — F1210 Cannabis abuse, uncomplicated: Secondary | ICD-10-CM | POA: Insufficient documentation

## 2013-09-02 DIAGNOSIS — Z7901 Long term (current) use of anticoagulants: Secondary | ICD-10-CM | POA: Insufficient documentation

## 2013-09-02 DIAGNOSIS — Z79899 Other long term (current) drug therapy: Secondary | ICD-10-CM

## 2013-09-02 DIAGNOSIS — R4589 Other symptoms and signs involving emotional state: Secondary | ICD-10-CM

## 2013-09-02 DIAGNOSIS — Z87448 Personal history of other diseases of urinary system: Secondary | ICD-10-CM

## 2013-09-02 DIAGNOSIS — F3289 Other specified depressive episodes: Secondary | ICD-10-CM | POA: Insufficient documentation

## 2013-09-02 DIAGNOSIS — R45851 Suicidal ideations: Secondary | ICD-10-CM

## 2013-09-02 DIAGNOSIS — R4689 Other symptoms and signs involving appearance and behavior: Secondary | ICD-10-CM

## 2013-09-02 LAB — CBC
HCT: 44.1 % (ref 39.0–52.0)
HEMOGLOBIN: 14.8 g/dL (ref 13.0–17.0)
MCH: 33 pg (ref 26.0–34.0)
MCHC: 33.6 g/dL (ref 30.0–36.0)
MCV: 98.4 fL (ref 78.0–100.0)
Platelets: 237 10*3/uL (ref 150–400)
RBC: 4.48 MIL/uL (ref 4.22–5.81)
RDW: 13.3 % (ref 11.5–15.5)
WBC: 4.6 10*3/uL (ref 4.0–10.5)

## 2013-09-02 LAB — RAPID URINE DRUG SCREEN, HOSP PERFORMED
Amphetamines: NOT DETECTED
BARBITURATES: NOT DETECTED
Benzodiazepines: NOT DETECTED
Cocaine: POSITIVE — AB
Opiates: POSITIVE — AB
TETRAHYDROCANNABINOL: POSITIVE — AB

## 2013-09-02 LAB — COMPREHENSIVE METABOLIC PANEL
ALK PHOS: 64 U/L (ref 39–117)
ALT: 33 U/L (ref 0–53)
AST: 22 U/L (ref 0–37)
Albumin: 4 g/dL (ref 3.5–5.2)
BILIRUBIN TOTAL: 0.3 mg/dL (ref 0.3–1.2)
BUN: 14 mg/dL (ref 6–23)
CHLORIDE: 98 meq/L (ref 96–112)
CO2: 24 mEq/L (ref 19–32)
Calcium: 9.3 mg/dL (ref 8.4–10.5)
Creatinine, Ser: 1.16 mg/dL (ref 0.50–1.35)
GFR, EST AFRICAN AMERICAN: 84 mL/min — AB (ref >90)
GFR, EST NON AFRICAN AMERICAN: 73 mL/min — AB (ref >90)
GLUCOSE: 96 mg/dL (ref 70–99)
POTASSIUM: 4 meq/L (ref 3.7–5.3)
SODIUM: 137 meq/L (ref 137–147)
Total Protein: 7.4 g/dL (ref 6.0–8.3)

## 2013-09-02 LAB — ACETAMINOPHEN LEVEL

## 2013-09-02 LAB — SALICYLATE LEVEL: Salicylate Lvl: <2 mg/dL — ABNORMAL LOW (ref 2.8–20.0)

## 2013-09-02 LAB — ETHANOL: Alcohol, Ethyl (B): <11 mg/dL (ref 0–11)

## 2013-09-02 MED ORDER — RIVAROXABAN 20 MG PO TABS
20.0000 mg | ORAL_TABLET | Freq: Every day | ORAL | Status: DC
  Administered 2013-09-02: 20 mg via ORAL
  Filled 2013-09-02 (×2): qty 1

## 2013-09-02 MED ORDER — ACETAMINOPHEN 325 MG PO TABS
650.0000 mg | ORAL_TABLET | Freq: Four times a day (QID) | ORAL | Status: DC | PRN
  Administered 2013-09-02 – 2013-09-03 (×2): 650 mg via ORAL
  Filled 2013-09-02 (×2): qty 2

## 2013-09-02 MED ORDER — TAMSULOSIN HCL 0.4 MG PO CAPS
0.4000 mg | ORAL_CAPSULE | Freq: Every day | ORAL | Status: DC
  Administered 2013-09-02: 0.4 mg via ORAL
  Filled 2013-09-02: qty 1

## 2013-09-02 MED ORDER — CYCLOBENZAPRINE HCL 10 MG PO TABS
10.0000 mg | ORAL_TABLET | Freq: Three times a day (TID) | ORAL | Status: DC | PRN
  Administered 2013-09-02 – 2013-09-03 (×2): 10 mg via ORAL
  Filled 2013-09-02 (×2): qty 1

## 2013-09-02 NOTE — ED Notes (Signed)
MD at bedside. 

## 2013-09-02 NOTE — ED Notes (Addendum)
States  Wants to hurt himself and having HI and psychosis and hallucinations visual and auditory substance abuse last used coke this am

## 2013-09-02 NOTE — ED Notes (Signed)
Pt brought back to room by Amy, NT with sitter in tow; [place], [place] present in room

## 2013-09-02 NOTE — ED Notes (Addendum)
[name] sec will notify house coverage /charge of need for sitter for SI and HI

## 2013-09-02 NOTE — BH Assessment (Signed)
Tele Assessment Note   [name] is an 48 y.o. male that was self-referred to ED requesting help for his SI and cocaine use.  Pt reported he is using cocaine daily and marijuana monthly.  Pt stated he has SI with no plan, but is unable to contract for safety.  Pt stated he needs help and can't do it on his own.  Pt [facility] and stayed clean after [facility].  [place], continued to have marital problems, and began using again.  Pt stated he is no longer married and has been staying with his cousin and then in a boarding house.  Pt endorses sx of depression and psychosis, reporting he hears people talking that are outside of his door (that aren't there) and sees people not there out of his periphery.  Pt denies HI.  Pt has no legal issues or court dates.  [facility] for med mgnt.  Pt was pleasant, cooperative, had depressed mood, flat affect, was cooperative and oriented x 4 with logical/coherent though processes and normal speech.  Consulted with Nanine MeansJamison Lord, NP @ [phone] who agreed pt needed inpatient stabilization for SI and EDP Steinl in agreement @ 1600.  Updated ED and TTS staff.  [facility], TTS will seek placement elsewhere.  Axis I: 296.34 Major Depressive Disorder, Recurrent, Severe With Psychotic Features, Cocaine Dependence Axis II: Deferred Axis III:  Past Medical History  Diagnosis Date  . DVT (deep venous thrombosis) 01/08/2011    lt leg  . DVT of lower limb, acute 01/12/2011  . BPH (benign prostatic hyperplasia)   . Current use of long term anticoagulation   . 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 Noncompliance w/medication treatment due to intermit use of medication   . History of tobacco use     QUIT early 2013  . Hypertension   . Depression   . Enlarged prostate    Axis IV:  housing problems, other psychosocial or environmental problems and problems with primary support group Axis V: 21-30 behavior considerably influenced by delusions or hallucinations OR serious impairment in judgment, communication OR inability to function in almost all areas  Past Medical History:  Past Medical History  Diagnosis Date  . DVT (deep venous thrombosis) 01/08/2011    lt leg  . DVT of lower limb, acute 01/12/2011  . BPH (benign prostatic hyperplasia)   . Current use of long term anticoagulation   . 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 Noncompliance w/medication treatment due to intermit use of medication   . History of tobacco use     QUIT early 2013  . Hypertension   . Depression   . Enlarged prostate     Past Surgical History  Procedure Laterality Date  . Tonsillectomy      Family History: No family history on file.  Social History:  reports that he has been smoking Cigars.  He has never used smokeless tobacco. He reports that he drinks about 1.2 ounces of alcohol per week. He reports that he uses illicit drugs (Cocaine and Marijuana) about 3 times per week.  Additional Social History:  Alcohol / Drug Use Pain Medications: see med list Prescriptions: see med list Over the Counter: see med  list History of alcohol / drug use?: Yes Longest period of sobriety (when/how long): 2 months - 05/2012 Negative Consequences of Use: Personal relationships Withdrawal Symptoms:  (pt denies ) Substance #1 Name of Substance 1: Cocaine 1 - Age of First Use: 25 1 - Amount (size/oz): $10-50 1 - Frequency: daily 1 - Duration: ongoing for 16 years 1 - Last Use / Amount: 09/02/13-$20 Substance #2 Name of Substance 2: Marijuana 2 - Age of First Use: 14 2 - Amount (size/oz): < 1 joint 2 - Frequency: 1 x every 2-3 weeks 2 - Duration: ongoing for 20 + years 2 - Last Use / Amount: 09/01/13-2 puffs of a joint  CIWA: CIWA-Ar BP:  138/92 mmHg Pulse Rate: 56 COWS: Clinical Opiate Withdrawal Scale (COWS) Resting Pulse Rate: Pulse Rate 80 or below Sweating: No report of chills or flushing Restlessness: Able to sit still Pupil Size: Pupils pinned or normal size for room light Bone or Joint Aches: Mild diffuse discomfort Runny Nose or Tearing: Not present GI Upset: No GI symptoms Tremor: No tremor Yawning: No yawning Anxiety or Irritability: Patient reports increasing irritability or anxiousness Gooseflesh Skin: Skin is smooth COWS Total Score: 2  Allergies: No Known Allergies  Home Medications:  (Not in a hospital admission)  OB/GYN Status:  No LMP for male patient.  General Assessment Data Location of Assessment: [facility] ED Is this a Tele or Face-to-Face Assessment?: Tele Assessment Living Arrangements: Other relatives Can pt return to current living arrangement?: Yes Admission Status: Voluntary Is patient capable of signing voluntary admission?: Yes Transfer from: Acute Hospital Referral Source: Self/Family/Friend     [facility] Crisis Care Plan Living Arrangements: Other relatives Name [facility] Psychiat[facility] Name of Therapist: none  Education Status Is patient currently in school?: No  Risk to self Suicidal Ideation: Yes-Currently Present Suicidal Intent: Yes-Currently Present Is patient at risk for suicide?: Yes Suicidal Plan?: No Access to Means: No What has been your use of drugs/alcohol within the last 12 months?: pt reports daily cocaine use and occ marijuana use Previous Attempts/Gestures: No How many times?: 0 Other Self Harm Risks: na - pt denies Triggers for Past Attempts: None known Intentional Self Injurious Behavior: None Family Suicide History: No Recent stressful life event(s): Other (Comment) (medical, primary support group, housing, SI, depression) Persecutory voices/beliefs?: No Depression: Yes Depression Symptoms: Despondent;Insomnia;Tearfulness;Loss of interest in  usual pleasures;Feeling worthless/self pity Substance abuse history and/or treatment for substance abuse?: Yes Suicide prevention information given to non-admitted patients: Not applicable  Risk to Others Homicidal Ideation: No Thoughts of Harm to Others: No Current Homicidal Intent: No Current Homicidal Plan: No Access to Homicidal Means: No Identified Victim: na - pt denies History of harm to others?: No Assessment of Violence: None Noted Violent Behavior Description: na - pt calm, cooperative Does patient have access to weapons?: No Criminal Charges Pending?: No Does patient have a court date: No  Psychosis Hallucinations: Auditory;Visual (Sees people out of the corner of eye, hears people talking) Delusions: None noted  Mental Status Report Appear/Hygiene: In scrubs Eye Contact: Fair Motor Activity: Freedom of movement;Unremarkable Speech: Logical/coherent Level of Consciousness: Alert Mood: Depressed Affect: Appropriate to circumstance Anxiety Level: Minimal Thought Processes: Coherent;Relevant Judgement: Unimpaired Orientation: Person;Place;Time;Situation Obsessive Compulsive Thoughts/Behaviors: None  Cognitive Functioning Concentration: Normal Memory: Recent Intact;Remote Intact IQ: Average Insight: Poor Impulse Control: Poor Appetite: Good Weight Loss: 0 Weight Gain: 0 Sleep: No Change Total Hours of Sleep:  (varies) Vegetative Symptoms: None  ADLScreening [facility] Assessment Services) Patient's  cognitive ability adequate to safely complete daily activities?: Yes Patient able to express need for assistance with ADLs?: Yes Independently performs ADLs?: Yes (appropriate for developmental age)  Prior Inpatient Therapy Prior Inpatient Therapy: Yes Prior Therapy Dates: 2014, 2013 [facility] Reason for Treatment: SA, Depression  Prior Outpatient Therapy Prior Outpatient Therapy: Yes Prior Therapy Dates:  Current [facility] Reason for Treatment: med mgnt  ADL Screening (condition at time of admission) Patient's cognitive ability adequate to safely complete daily activities?: Yes Is the patient deaf or have difficulty hearing?: No Does the patient have difficulty seeing, even when wearing glasses/contacts?: No Does the patient have difficulty concentrating, remembering, or making decisions?: No Patient able to express need for assistance with ADLs?: Yes Does the patient have difficulty dressing or bathing?: No Independently performs ADLs?: Yes (appropriate for developmental age) Does the patient have difficulty walking or climbing stairs?: No  Home Assistive Devices/Equipment Home Assistive Devices/Equipment: None    Abuse/Neglect Assessment (Assessment to be complete while patient is alone) Physical Abuse: Denies Verbal Abuse: Denies Sexual Abuse: Denies Exploitation of patient/patient's resources: Denies Self-Neglect: Denies Values / Beliefs Cultural Requests During Hospitalization: None Spiritual Requests During Hospitalization: None Consults Spiritual Care Consult Needed: No Social Work Consult Needed: No Merchant navy officerAdvance Directives (For Healthcare) Advance Directive: Patient does not have advance directive;Patient would not like information          Disposition:  Disposition Initial Assessment Completed for this Encounter: Yes Disposition of Patient: Referred to;Inpatient treatment program Type of inpatient treatment program: Adult  Casimer LaniusKristen Butler, MS, [facility] Licensed Professional Counselor Triage Specialist  09/02/2013 3:33 PM

## 2013-09-02 NOTE — ED Notes (Signed)
Pt c/o lower back pain.  Tylenol 650mg  and Flexeril 10mg  PO given for same.  Pt resting quietly watching television.  Sitter remains at bedside.

## 2013-09-02 NOTE — Progress Notes (Signed)
Patient has been referred to  [facility], per Calvin there are beds open  [facility], per Darlene there are open beds.  [facility], per Tracey there are open beds.  [facility], per Tony expecting discharges.  [facility], per Shere there are open beds.  No beds at the following facilities:  [facility], per Lisa.  [facility] 

## 2013-09-02 NOTE — ED Notes (Signed)
TTS HAS STARTED 

## 2013-09-02 NOTE — ED Provider Notes (Signed)
CSN: [id number]     Arrival date & time 09/02/13  1136 History   First MD Initiated Contact with Patient 09/02/13 1314     Chief Complaint  Patient presents with  . Addiction Problem  . Suicidal     (Consider location/radiation/quality/duration/timing/severity/associated sxs/prior Treatment) The history is provided by the patient.  pt with hx depression, substance abuse, presents w worsening depression, SI, and wanting substance abuse rehab. States is living at a boarding house, estranged from family. Has been using cocaine and thc several x per week. Denies daily etoh abuse. Denies any hx complicated etoh or substance abuse withdrawal symptoms. States physical health at baseline w exception of noting recent ed visit for low back pain. States thoughts of suicide, but has not tried to harm self. No HI.        Past Medical History  Diagnosis Date  . DVT (deep venous thrombosis) 01/08/2011    lt leg  . DVT of lower limb, acute 01/12/2011  . BPH (benign prostatic hyperplasia)   . Current use of long term anticoagulation   . 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 Noncompliance w/medication treatment due to intermit use of medication   . History of tobacco use     QUIT early 2013  . Hypertension   . Depression   . Enlarged prostate    Past Surgical History  Procedure Laterality Date  . Tonsillectomy     No family history on file. History  Substance Use Topics  . Smoking status: Current Every Day Smoker -- 6 years    Types: Cigars    Last Attempt to Quit: 09/14/2010  . Smokeless tobacco: Never Used  . Alcohol Use: 1.2 oz/week    2 Cans of beer per week     Comment: no alcohol since 03/2012    Review of Systems  Constitutional: Negative for fever.  HENT: Negative for sore throat.   Eyes: Negative for redness.  Respiratory: Negative for shortness of breath.   Cardiovascular: Negative for chest pain.  Gastrointestinal:  Negative for vomiting, abdominal pain and diarrhea.  Genitourinary: Negative for flank pain.  Musculoskeletal: Negative for back pain and neck pain.  Skin: Negative for rash.  Neurological: Negative for headaches.  Hematological: Does not bruise/bleed easily.  Psychiatric/Behavioral: Positive for dysphoric mood.      Allergies  Review of patient's allergies indicates no known allergies.  Home Medications   Prior to Admission medications   Medication Sig Start Date End Date Taking? Authorizing Provider  acetaminophen (TYLENOL) 650 MG CR tablet Take 1,300 mg by mouth 3 (three) times daily as needed for pain.   Yes Historical Provider, MD  acetaminophen-codeine (TYLENOL #3) 300-30 MG per tablet Take 1 tablet by mouth 3 (three) times daily as needed for moderate pain.   Yes Historical Provider, MD  Cholecalciferol (VITAMIN D PO) Take 2,000 Units by mouth daily.    Yes Historical Provider, MD  cyclobenzaprine (FLEXERIL) 10 MG tablet Take 10 mg by mouth 3 (three) times daily as needed for muscle spasms.   Yes Historical Provider, MD  Multiple Vitamins-Minerals (MULTIVITAMIN WITH MINERALS) tablet Take 1 tablet by mouth daily.   Yes Historical Provider, MD  Rivaroxaban (XARELTO) 20 MG TABS Take 20 mg by mouth every morning.    Yes Historical Provider, MD  tamsulosin (FLOMAX) 0.4 MG CAPS capsule Take 0.4 mg by mouth at bedtime.    Yes Historical Provider, MD   BP 138/92  Pulse 56  Temp(Src) 98.1 F (36.7 C) (Oral)  Resp 21  Wt 173 lb 6.4 oz (78.654 kg)  SpO2 99% Physical Exam  Nursing note and vitals reviewed. Constitutional: He is oriented to person, place, and time. He appears well-developed and well-nourished. No distress.  HENT:  Mouth/Throat: Oropharynx is clear and moist.  Eyes: Conjunctivae are normal. Pupils are equal, round, and reactive to light.  Neck: Neck supple. No tracheal deviation present.  Cardiovascular: Normal rate, regular rhythm, normal heart sounds and intact  distal pulses.   Pulmonary/Chest: Effort normal and breath sounds normal. No accessory muscle usage. No respiratory distress.  Abdominal: Soft. He exhibits no distension. There is no tenderness.  Musculoskeletal: Normal range of motion. He exhibits no edema and no tenderness.  Neurological: He is alert and oriented to person, place, and time.  Steady gait. No tremor or shakes.   Skin: Skin is warm and dry. He is not diaphoretic.  Psychiatric:  Depressed mood. Flat affect. +si.     ED Course  Procedures (including critical care time) Labs Review  Results for orders placed during the hospital encounter of 09/02/13  ACETAMINOPHEN LEVEL      Result Value Ref Range   Acetaminophen (Tylenol), Serum <15.0  10 - 30 ug/mL  CBC      Result Value Ref Range   WBC 4.6  4.0 - 10.5 K/uL   RBC 4.48  4.22 - 5.81 MIL/uL   Hemoglobin 14.8  13.0 - 17.0 g/dL   HCT 16.144.1  09.639.0 - 04.552.0 %   MCV 98.4  78.0 - 100.0 fL   MCH 33.0  26.0 - 34.0 pg   MCHC 33.6  30.0 - 36.0 g/dL   RDW 40.913.3  81.111.5 - 91.415.5 %   Platelets 237  150 - 400 K/uL  COMPREHENSIVE METABOLIC PANEL      Result Value Ref Range   Sodium 137  137 - 147 mEq/L   Potassium 4.0  3.7 - 5.3 mEq/L   Chloride 98  96 - 112 mEq/L   CO2 24  19 - 32 mEq/L   Glucose, Bld 96  70 - 99 mg/dL   BUN 14  6 - 23 mg/dL   Creatinine, Ser 7.821.16  0.50 - 1.35 mg/dL   Calcium 9.3  8.4 - 95.610.5 mg/dL   Total Protein 7.4  6.0 - 8.3 g/dL   Albumin 4.0  3.5 - 5.2 g/dL   AST 22  0 - 37 U/L   ALT 33  0 - 53 U/L   Alkaline Phosphatase 64  39 - 117 U/L   Total Bilirubin 0.3  0.3 - 1.2 mg/dL   GFR calc non Af Amer 73 (*) >90 mL/min   GFR calc Af Amer 84 (*) >90 mL/min  ETHANOL      Result Value Ref Range   Alcohol, Ethyl (B) <11  0 - 11 mg/dL  SALICYLATE LEVEL      Result Value Ref Range   Salicylate Lvl <2.0 (*) 2.8 - 20.0 mg/dL   Dg Lumbar Spine Complete  08/09/2013   CLINICAL DATA:  One month history of lower back pain progressive over the past 2-3 days. No known  injury.  EXAM: LUMBAR SPINE - COMPLETE 4+ VIEW  COMPARISON:  None.  FINDINGS: No acute fracture or malalignment. Vertebral body heights are maintained. Degenerative endplate spurring at L4 anteriorly. Mild facet arthropathy at L5-S1. Normal bony mineralization. No lytic of blastic osseous lesion. No significant sclerosis or erosive changes involving  either sacroiliac joint. The visualized bowel gas pattern is unremarkable.  IMPRESSION: 1. No acute fracture or malalignment. 2. Mild degenerative change with endplate spurring at L4 and L5-S1 facet arthropathy.   Electronically Signed   By: Malachy Moan M.D.   On: 08/09/2013 14:47       MDM  Labs.   Reviewed nursing notes and prior charts for additional history.   Psych team consulted.  Disposition and placement per psych team - pt moved to pod C awaiting their dispo.    Suzi Roots, MD 09/02/13 [phone]

## 2013-09-03 ENCOUNTER — Inpatient Hospital Stay (HOSPITAL_COMMUNITY)
Admission: AD | Admit: 2013-09-03 | Discharge: 2013-09-08 | DRG: 885 | Disposition: A | Discharge status: Rehabilitation Facility | Payer: Non-veteran care | Source: Intra-hospital | Attending: Psychiatry | Admitting: Psychiatry

## 2013-09-03 ENCOUNTER — Encounter (HOSPITAL_COMMUNITY): Payer: Self-pay | Admitting: *Deleted

## 2013-09-03 DIAGNOSIS — F332 Major depressive disorder, recurrent severe without psychotic features: Principal | ICD-10-CM | POA: Diagnosis present

## 2013-09-03 DIAGNOSIS — M545 Low back pain, unspecified: Secondary | ICD-10-CM | POA: Diagnosis present

## 2013-09-03 DIAGNOSIS — Z7901 Long term (current) use of anticoagulants: Secondary | ICD-10-CM

## 2013-09-03 DIAGNOSIS — F1423 Cocaine dependence with withdrawal: Secondary | ICD-10-CM

## 2013-09-03 DIAGNOSIS — I10 Essential (primary) hypertension: Secondary | ICD-10-CM | POA: Diagnosis present

## 2013-09-03 DIAGNOSIS — Z5900 Homelessness unspecified: Secondary | ICD-10-CM

## 2013-09-03 DIAGNOSIS — G8929 Other chronic pain: Secondary | ICD-10-CM | POA: Diagnosis present

## 2013-09-03 DIAGNOSIS — Z86718 Personal history of other venous thrombosis and embolism: Secondary | ICD-10-CM

## 2013-09-03 DIAGNOSIS — F1410 Cocaine abuse, uncomplicated: Secondary | ICD-10-CM

## 2013-09-03 DIAGNOSIS — Z9119 Patient's noncompliance with other medical treatment and regimen: Secondary | ICD-10-CM

## 2013-09-03 DIAGNOSIS — F1420 Cocaine dependence, uncomplicated: Secondary | ICD-10-CM | POA: Diagnosis present

## 2013-09-03 DIAGNOSIS — Z5987 Material hardship due to limited financial resources, not elsewhere classified: Secondary | ICD-10-CM

## 2013-09-03 DIAGNOSIS — F1220 Cannabis dependence, uncomplicated: Secondary | ICD-10-CM | POA: Diagnosis present

## 2013-09-03 DIAGNOSIS — R45851 Suicidal ideations: Secondary | ICD-10-CM

## 2013-09-03 DIAGNOSIS — Z91199 Patient's noncompliance with other medical treatment and regimen due to unspecified reason: Secondary | ICD-10-CM

## 2013-09-03 DIAGNOSIS — F411 Generalized anxiety disorder: Secondary | ICD-10-CM | POA: Diagnosis present

## 2013-09-03 DIAGNOSIS — F1990 Other psychoactive substance use, unspecified, uncomplicated: Secondary | ICD-10-CM

## 2013-09-03 DIAGNOSIS — F1994 Other psychoactive substance use, unspecified with psychoactive substance-induced mood disorder: Secondary | ICD-10-CM | POA: Diagnosis present

## 2013-09-03 DIAGNOSIS — Z598 Other problems related to housing and economic circumstances: Secondary | ICD-10-CM

## 2013-09-03 DIAGNOSIS — F17200 Nicotine dependence, unspecified, uncomplicated: Secondary | ICD-10-CM | POA: Diagnosis present

## 2013-09-03 MED ORDER — TAMSULOSIN HCL 0.4 MG PO CAPS
0.4000 mg | ORAL_CAPSULE | Freq: Every day | ORAL | Status: DC
  Administered 2013-09-04 – 2013-09-07 (×3): 0.4 mg via ORAL
  Filled 2013-09-03 (×8): qty 1

## 2013-09-03 MED ORDER — ALUM & MAG HYDROXIDE-SIMETH 200-200-20 MG/5ML PO SUSP: 30.0000 mL | ORAL | Status: DC | PRN

## 2013-09-03 MED ORDER — RIVAROXABAN 20 MG PO TABS
20.0000 mg | ORAL_TABLET | Freq: Every day | ORAL | Status: DC
  Administered 2013-09-03 – 2013-09-07 (×5): 20 mg via ORAL
  Filled 2013-09-03 (×9): qty 1

## 2013-09-03 MED ORDER — AMANTADINE HCL 100 MG PO CAPS
100.0000 mg | ORAL_CAPSULE | Freq: Two times a day (BID) | ORAL | Status: DC
  Administered 2013-09-03: 100 mg via ORAL
  Filled 2013-09-03 (×2): qty 1

## 2013-09-03 MED ORDER — MAGNESIUM HYDROXIDE 400 MG/5ML PO SUSP: 30.0000 mL | Freq: Every day | ORAL | Status: DC | PRN

## 2013-09-03 MED ORDER — ACETAMINOPHEN 325 MG PO TABS: 650.0000 mg | ORAL_TABLET | Freq: Four times a day (QID) | ORAL | Status: DC | PRN

## 2013-09-03 MED ORDER — CYCLOBENZAPRINE HCL 10 MG PO TABS
10.0000 mg | ORAL_TABLET | Freq: Three times a day (TID) | ORAL | Status: DC | PRN
  Administered 2013-09-04 – 2013-09-07 (×5): 10 mg via ORAL
  Filled 2013-09-03 (×5): qty 2

## 2013-09-03 MED ORDER — ACETAMINOPHEN 325 MG PO TABS
650.0000 mg | ORAL_TABLET | Freq: Four times a day (QID) | ORAL | Status: DC | PRN
  Administered 2013-09-04 – 2013-09-08 (×4): 650 mg via ORAL
  Filled 2013-09-03 (×4): qty 2

## 2013-09-03 MED ORDER — SERTRALINE HCL 50 MG PO TABS
50.0000 mg | ORAL_TABLET | Freq: Every day | ORAL | Status: DC
  Administered 2013-09-03: 50 mg via ORAL
  Filled 2013-09-03: qty 1

## 2013-09-03 NOTE — Progress Notes (Signed)
Adult Psychoeducational Group Note  Date:  09/03/2013 Time:  9:51 PM  Group Topic/Focus:  Wrap-Up Group:   The focus of this group is to help patients review their daily goal of treatment and discuss progress on daily workbooks.  Participation Level:  Active  Participation Quality:  Appropriate  Affect:  Appropriate  Cognitive:  Appropriate  Insight: Appropriate  Engagement in Group:  Engaged  Modes of Intervention:  Discussion  Additional Comments: The patient expressed that his day was good.The patient seem flat as he made his commment.  Octavio Mannshigpen, [employer] 09/03/2013, 9:51 PM

## 2013-09-03 NOTE — ED Notes (Signed)
CALLED [facility] TO CHECK ON DELAY. NEAL ADVISES HE DOESN'T KNOW WHY THE DRIVER HASNT ARRIVED AND IS GOING TO CALL AND CHECK ON THEM

## 2013-09-03 NOTE — Progress Notes (Signed)
[facility] INPATIENT:  Family/Significant Other Suicide Prevention Education  Suicide Prevention Education:  Patient Refusal for Family/Significant Other Suicide Prevention Education: The patient [name] has refused to provide written consent for family/significant other to be provided Family/Significant Other Suicide Prevention Education during admission and/or prior to discharge.  Physician notified.  Loren RacerMaggio, Kimberly J 09/03/2013, 1:20 PM

## 2013-09-03 NOTE — ED Notes (Signed)
[facility] CALLED TO TRANSPORT PT TO [facility] 

## 2013-09-03 NOTE — [facility] Group Notes (Signed)
[facility] Group Notes: (Clinical Social Work)   09/03/2013      Type of Therapy:  Group Therapy   Participation Level:  Did Not Attend    Ambrose MantleMareida Grossman-Orr, LCSW 09/03/2013, 5:00 PM

## 2013-09-03 NOTE — Tx Team (Signed)
Initial Interdisciplinary Treatment Plan  PATIENT STRENGTHS: (choose at least two) Ability for insight General fund of knowledge Work skills  PATIENT STRESSORS: Medication change or noncompliance Substance abuse   PROBLEM LIST: Problem List/Patient Goals Date to be addressed Date deferred Reason deferred Estimated date of resolution  Depression      Substance Abuse                                                 DISCHARGE CRITERIA:  Ability to meet basic life and health needs Improved stabilization in mood, thinking, and/or behavior Reduction of life-threatening or endangering symptoms to within safe limits  PRELIMINARY DISCHARGE PLAN: Attend aftercare/continuing care group Attend 12-step recovery group Return to previous living arrangement  PATIENT/FAMIILY INVOLVEMENT: This treatment plan has been presented to and reviewed with the patient, Gilman ButtnerJames L Castelli, and/or family member, .  The patient and family have been given the opportunity to ask questions and make suggestions.  Andrena Mewsuttall, Penny J 09/03/2013, 4:29 PM

## 2013-09-03 NOTE — Consult Note (Signed)
Telepsych Consultation   Reason for Consult:  Depression, Cocaine use, Marijuana use Referring Physician:  EDP [name] is an 48 y.o. male.  Assessment: AXIS I:  Major Depression, Recurrent severe, Substance Induced Mood Disorder and Cocaine use d/o, Marijuana use d/o AXIS II:  Deferred AXIS III:   Past Medical History  Diagnosis Date  . DVT (deep venous thrombosis) 01/08/2011    lt leg  . DVT of lower limb, acute 01/12/2011  . BPH (benign prostatic hyperplasia)   . Current use of long term anticoagulation   . 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 Noncompliance w/medication treatment due to intermit use of medication   . History of tobacco use     QUIT early 2013  . Hypertension   . Depression   . Enlarged prostate    AXIS IV:  housing problems, other psychosocial or environmental problems, problems related to social environment and problems with primary support group AXIS V:  41-50 serious symptoms  Plan:  Recommend psychiatric Inpatient admission when medically cleared.  Subjective:   [name] is a 48 y.o. male patient admitted with Major depression, Recurrent, severe, Substance abuse.  HPI:  AA male, 48 years old was evaluated this am for feeling very depressed and using substances;Cocaine and Marijuana.  Patient is homeless, is going through divorce and feeling hopeless and helpless.  Patient reports using Cocaine daily and Marijuana once a week or more depending on if he can afford it.   His last use of any substance was this past Thursday. Patient reports previous inpatient treatment for Cocaine and Marijuana in 2011 and 2014 at [facility].  Patient reports being clean for  3 months before relapsing.  Patient started using Marijuana at age 31 and started using Cocaine at age 62.  He was diagnosed with depression in the past and was placed on different medications of which patient do not remember.  He is a  English as a second language teacher and was supposed to be receiving his treatment at [facility] clinic.  Patient reports poor sleep but good appetite.  He denies alcohol use and have never been treated for alcohol problem in the past.  Today he denies SI/HI/AVH and he is asking for treatment for his drug problem and get back on his depression medications.  We have accepted patient in our observation unit and we will start patient on antidepressant.  We will treat his discomforts while he is withdrawing from cocaine and Marijuana use.  HPI Elements:   Location:  Major depression, substance induced mood disorder. Quality:  feeling hopeless, helpless, homelessness, felt suicidal yesterday. Severity:  severe. Duration:  intermittent since age 69.  Past Psychiatric History: Past Medical History  Diagnosis Date  . DVT (deep venous thrombosis) 01/08/2011    lt leg  . DVT of lower limb, acute 01/12/2011  . BPH (benign prostatic hyperplasia)   . Current use of long term anticoagulation   . 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 Noncompliance w/medication treatment due to intermit use of medication   . History of tobacco use     QUIT early 2013  . Hypertension   . Depression   . Enlarged prostate     reports that he has been smoking Cigars.  He has never used smokeless tobacco. He reports that he drinks about 1.2 ounces of alcohol per week. He reports that he uses illicit drugs (  Cocaine and Marijuana) about 3 times per week. No family history on file. Family History Substance Abuse: No Family Supports: No Living Arrangements: Other relatives Can pt return to current living arrangement?: Yes Allergies:  No Known Allergies  ACT Assessment Complete:  No:   Past Psychiatric History: Diagnosis:  Major depressive disorder, recurrent severe  Hospitalizations: na  Outpatient Care:  [place] [facility]  Substance Abuse Care:  [facility] 2011 AND 2014      Self-Mutilation:  denied  Suicidal Attempts: denied  Homicidal Behaviors:  denied   Violent Behaviors: denied   Place of Residence:  Homeless in [place] Marital Status:  separated from wife, pending divorce Employed/Unemployed:  Unemployed Education:  2 year collage Family Supports:  none Objective: Blood pressure 111/48, pulse 61, temperature 98.1 F (36.7 C), temperature source Oral, resp. rate 18, weight 78.654 kg (173 lb 6.4 oz), SpO2 98.00%.Body mass index is 26.37 kg/(m^2). Results for orders placed during the hospital encounter of 09/02/13 (from the past 72 hour(s))  ACETAMINOPHEN LEVEL     Status: None   Collection Time    09/02/13 12:00 PM      Result Value Ref Range   Acetaminophen (Tylenol), Serum <15.0  10 - 30 ug/mL   Comment:            THERAPEUTIC CONCENTRATIONS VARY     SIGNIFICANTLY. A RANGE OF 10-30     ug/mL MAY BE AN EFFECTIVE     CONCENTRATION FOR MANY PATIENTS.     HOWEVER, SOME ARE BEST TREATED     AT CONCENTRATIONS OUTSIDE THIS     RANGE.     ACETAMINOPHEN CONCENTRATIONS     >150 ug/mL AT 4 HOURS AFTER     INGESTION AND >50 ug/mL AT 12     HOURS AFTER INGESTION ARE     OFTEN ASSOCIATED WITH TOXIC     REACTIONS.  CBC     Status: None   Collection Time    09/02/13 12:00 PM      Result Value Ref Range   WBC 4.6  4.0 - 10.5 K/uL   RBC 4.48  4.22 - 5.81 MIL/uL   Hemoglobin 14.8  13.0 - 17.0 g/dL   HCT 44.1  39.0 - 52.0 %   MCV 98.4  78.0 - 100.0 fL   MCH 33.0  26.0 - 34.0 pg   MCHC 33.6  30.0 - 36.0 g/dL   RDW 13.3  11.5 - 15.5 %   Platelets 237  150 - 400 K/uL  COMPREHENSIVE METABOLIC PANEL     Status: Abnormal   Collection Time    09/02/13 12:00 PM      Result Value Ref Range   Sodium 137  137 - 147 mEq/L   Potassium 4.0  3.7 - 5.3 mEq/L   Chloride 98  96 - 112 mEq/L   CO2 24  19 - 32 mEq/L   Glucose, Bld 96  70 - 99 mg/dL   BUN 14  6 - 23 mg/dL   Creatinine, Ser 1.16  0.50 - 1.35 mg/dL   Calcium 9.3  8.4 - 10.5 mg/dL   Total Protein  7.4  6.0 - 8.3 g/dL   Albumin 4.0  3.5 - 5.2 g/dL   AST 22  0 - 37 U/L   ALT 33  0 - 53 U/L   Alkaline Phosphatase 64  39 - 117 U/L   Total Bilirubin 0.3  0.3 - 1.2 mg/dL   GFR calc non Af  Amer 73 (*) >90 mL/min   GFR calc Af Amer 84 (*) >90 mL/min   Comment: (NOTE)     The eGFR has been calculated using the CKD EPI equation.     This calculation has not been validated in all clinical situations.     eGFR's persistently <90 mL/min signify possible Chronic Kidney     Disease.  ETHANOL     Status: None   Collection Time    09/02/13 12:00 PM      Result Value Ref Range   Alcohol, Ethyl (B) <11  0 - 11 mg/dL   Comment:            LOWEST DETECTABLE LIMIT FOR     SERUM ALCOHOL IS 11 mg/dL     FOR MEDICAL PURPOSES ONLY  SALICYLATE LEVEL     Status: Abnormal   Collection Time    09/02/13 12:00 PM      Result Value Ref Range   Salicylate Lvl <0.0 (*) 2.8 - 20.0 mg/dL  URINE RAPID DRUG SCREEN (HOSP PERFORMED)     Status: Abnormal   Collection Time    09/02/13  1:26 PM      Result Value Ref Range   Opiates POSITIVE (*) NONE DETECTED   Cocaine POSITIVE (*) NONE DETECTED   Benzodiazepines NONE DETECTED  NONE DETECTED   Amphetamines NONE DETECTED  NONE DETECTED   Tetrahydrocannabinol POSITIVE (*) NONE DETECTED   Barbiturates NONE DETECTED  NONE DETECTED   Comment:            DRUG SCREEN FOR MEDICAL PURPOSES     ONLY.  IF CONFIRMATION IS NEEDED     FOR ANY PURPOSE, NOTIFY LAB     WITHIN 5 DAYS.                LOWEST DETECTABLE LIMITS     FOR URINE DRUG SCREEN     Drug Class       Cutoff (ng/mL)     Amphetamine      1000     Barbiturate      200     Benzodiazepine   938     Tricyclics       182     Opiates          300     Cocaine          300     THC              50   Labs are reviewed and are pertinent for UDS is positive for cocaine, Marijuana and opiates  Current Facility-Administered Medications  Medication Dose Route Frequency Alize Acy Last Rate Last Dose  .  acetaminophen (TYLENOL) tablet 650 mg  650 mg Oral Q6H PRN Mirna Mires, MD   650 mg at 09/03/13 0736  . cyclobenzaprine (FLEXERIL) tablet 10 mg  10 mg Oral TID PRN Mirna Mires, MD   10 mg at 09/03/13 0735  . rivaroxaban (XARELTO) tablet 20 mg  20 mg Oral Q supper Mirna Mires, MD   20 mg at 09/02/13 1655  . tamsulosin (FLOMAX) capsule 0.4 mg  0.4 mg Oral QHS Mirna Mires, MD   0.4 mg at 09/02/13 2151   Current Outpatient Prescriptions  Medication Sig Dispense Refill  . acetaminophen (TYLENOL) 650 MG CR tablet Take 1,300 mg by mouth 3 (three) times daily as needed for pain.      Marland Kitchen acetaminophen-codeine (TYLENOL #3) 300-30 MG per tablet  Take 1 tablet by mouth 3 (three) times daily as needed for moderate pain.      . Cholecalciferol (VITAMIN D PO) Take 2,000 Units by mouth daily.       . cyclobenzaprine (FLEXERIL) 10 MG tablet Take 10 mg by mouth 3 (three) times daily as needed for muscle spasms.      . Multiple Vitamins-Minerals (MULTIVITAMIN WITH MINERALS) tablet Take 1 tablet by mouth daily.      . Rivaroxaban (XARELTO) 20 MG TABS Take 20 mg by mouth every morning.       . tamsulosin (FLOMAX) 0.4 MG CAPS capsule Take 0.4 mg by mouth at bedtime.       . [DISCONTINUED] ipratropium (ATROVENT) 0.03 % nasal spray Place 2 sprays into the nose 2 (two) times daily. PRN congestion  30 mL  0    Psychiatric Specialty Exam:     Blood pressure 111/48, pulse 61, temperature 98.1 F (36.7 C), temperature source Oral, resp. rate 18, weight 78.654 kg (173 lb 6.4 oz), SpO2 98.00%.Body mass index is 26.37 kg/(m^2).  General Appearance: Casual  Eye Contact::  Fair  Speech:  Clear and Coherent and Normal Rate  Volume:  Normal  Mood:  Depressed, Hopeless, Worthless and helpless  Affect:  Congruent, Depressed and Flat  Thought Process:  Coherent and Goal Directed  Orientation:  Full (Time, Place, and Person)  Thought Content:  WDL  Suicidal Thoughts:  No  Homicidal Thoughts:  No  Memory:   Immediate;   Good Recent;   Good Remote;   Good  Judgement:  Poor  Insight:  Good  Psychomotor Activity:  Normal  Concentration:  Good  Recall:  NA  Akathisia:  NA  Handed:  Right  AIMS (if indicated):     Assets:  Desire for Improvement Housing  Sleep:      Treatment Plan Summary: Medication Management  Disposition:  Will admit to Observation area Disposition Initial Assessment Completed for this Encounter: Yes Disposition of Patient: Referred to;Inpatient treatment program Type of inpatient treatment program: Adult  Delfin Gant   PMHNP-BC 09/03/2013 9:53 AM       I agree with assessment and plan Geralyn Flash A. Sabra Heck, M.D.

## 2013-09-03 NOTE — Progress Notes (Signed)
Patient ID: [name], male   DOB: 1965/03/16, 48 y.o.   MRN: [id number] Patient transferred from O)BS to Adult 500 hall. Patient denies SI, HI at this time. States he does see "shadows" and hear some "chatter" in his head. Denies signs and sx of withdrawal. Vitals stable. No meds given while on obs unit.  [facility]. Taken by tech to search room to sign inpatient paperwork and retrieve clothes to wear on the unit.

## 2013-09-03 NOTE — Progress Notes (Signed)
Patient ID: [name], male   DOB: [birth year], 48 y.o.   MRN: [id number] [facility]. Has been using crack every day for the last month and one half.  Stated problems began with drugs again when [place] [facility] [place] [facility] and was having problems with his wive.  Is no longer with his wife but problem continues. Denies SI at this time. Stated he sometimes hears voices he can't make out and sees shadows in his periphery. Denies HI. No signs/symptoms of withdrawal. Has bone spurs on back that cause him some pain. Takes Flexeril for this. VS are stable. Oriented to unit. Nutrition offered. Education provided regarding safety and falls. Safety checks started every 15 minutes.

## 2013-09-03 NOTE — Plan of Care (Signed)
[facility] Observation Crisis Plan  Reason for Crisis Plan:  Crisis Stabilization   Plan of Care:  Referral for Telepsychiatry/Psychiatric Consult  Family Support:      Current Living Environment:  Living Arrangements: Non-relatives/Friends  Insurance:   Hospital Account   Name Acct ID Class Status Primary Coverage   [name], Densil L [id number] BEHAVIORAL HEALTH OBSERVATION Open [employer]        Guarantor Account (for Hospital Account [id number])   Name Relation to Pt Service Area Active? Acct Type   [name], Venancio L Self [facility] Yes [facility]   Address Phone       [address] [place], [place] [id number] [phone])          Coverage Information (for Hospital Account [id number])   F/O Payor/Plan Precert #   [employer]    Subscriber Subscriber #   [name], Avram L [id number]   Address Phone   [address] [place], [place] [id number] [phone]      Legal Guardian:     Primary Care Provider:  Default, Provider, MD  Current [facility] Providers[facility]  [facility]  Psychiatrist:     Counselor/Therapist:     Compliant with Medications:  Yes   Additional Information:   Loren RacerMaggio, Kimberly J 6/27/20151:18 PM

## 2013-09-03 NOTE — ED Notes (Signed)
[name] HAS ARRIVED TO TRANSPORT.

## 2013-09-03 NOTE — ED Notes (Signed)
Patient states he has been more depressed and frustrated lately. States he came to the ed because "i had to be committed somewhere". [facility] and states it takes months to get an appt there.

## 2013-09-03 NOTE — ED Provider Notes (Signed)
Patient presented yesterday with suicidal ideation, depression, and substance abuse.  He has been medically stable and is accepted to behavioral health by Dr. Lucianne MussKumar.  Filed Vitals:   09/03/13 0618  BP: 111/48  Pulse: 61  Temp: 98.1 F (36.7 C)  Resp: 18   Results for orders placed during the hospital encounter of 09/02/13  ACETAMINOPHEN LEVEL      Result Value Ref Range   Acetaminophen (Tylenol), Serum <15.0  10 - 30 ug/mL  CBC      Result Value Ref Range   WBC 4.6  4.0 - 10.5 K/uL   RBC 4.48  4.22 - 5.81 MIL/uL   Hemoglobin 14.8  13.0 - 17.0 g/dL   HCT 16.144.1  09.639.0 - 04.552.0 %   MCV 98.4  78.0 - 100.0 fL   MCH 33.0  26.0 - 34.0 pg   MCHC 33.6  30.0 - 36.0 g/dL   RDW 40.913.3  81.111.5 - 91.415.5 %   Platelets 237  150 - 400 K/uL  COMPREHENSIVE METABOLIC PANEL      Result Value Ref Range   Sodium 137  137 - 147 mEq/L   Potassium 4.0  3.7 - 5.3 mEq/L   Chloride 98  96 - 112 mEq/L   CO2 24  19 - 32 mEq/L   Glucose, Bld 96  70 - 99 mg/dL   BUN 14  6 - 23 mg/dL   Creatinine, Ser 7.821.16  0.50 - 1.35 mg/dL   Calcium 9.3  8.4 - 95.610.5 mg/dL   Total Protein 7.4  6.0 - 8.3 g/dL   Albumin 4.0  3.5 - 5.2 g/dL   AST 22  0 - 37 U/L   ALT 33  0 - 53 U/L   Alkaline Phosphatase 64  39 - 117 U/L   Total Bilirubin 0.3  0.3 - 1.2 mg/dL   GFR calc non Af Amer 73 (*) >90 mL/min   GFR calc Af Amer 84 (*) >90 mL/min  ETHANOL      Result Value Ref Range   Alcohol, Ethyl (B) <11  0 - 11 mg/dL  SALICYLATE LEVEL      Result Value Ref Range   Salicylate Lvl <2.0 (*) 2.8 - 20.0 mg/dL  URINE RAPID DRUG SCREEN (HOSP PERFORMED)      Result Value Ref Range   Opiates POSITIVE (*) NONE DETECTED   Cocaine POSITIVE (*) NONE DETECTED   Benzodiazepines NONE DETECTED  NONE DETECTED   Amphetamines NONE DETECTED  NONE DETECTED   Tetrahydrocannabinol POSITIVE (*) NONE DETECTED   Barbiturates NONE DETECTED  NONE DETECTED     Patient without complaints and voices understanding of plan.  He takes rivoraxaban for history of  "blood clots" and states he received this last night.  He denies swelling, chest pain, or dyspnea.  Patient stable for transfer.   Hilario Quarryanielle S Ray, MD 09/03/13 [id number]

## 2013-09-03 NOTE — Progress Notes (Signed)
Pt is a 48 year old male admitted with depression and cocaine abuse   He reports stressors of a failed marriage and relapsing on cocaine since his return to [place]  He is currently homeless but living with family members  [facility]    Pt was admitted to the adult 500 hall   He was offered nourishment and oriented to the unit   Q 15 min checks explained and implemented   Pt is safe at present time

## 2013-09-04 ENCOUNTER — Encounter (HOSPITAL_COMMUNITY): Payer: Self-pay | Admitting: Psychiatry

## 2013-09-04 DIAGNOSIS — F1420 Cocaine dependence, uncomplicated: Secondary | ICD-10-CM | POA: Diagnosis present

## 2013-09-04 DIAGNOSIS — F1994 Other psychoactive substance use, unspecified with psychoactive substance-induced mood disorder: Secondary | ICD-10-CM

## 2013-09-04 MED ORDER — ESCITALOPRAM OXALATE 5 MG PO TABS
5.0000 mg | ORAL_TABLET | Freq: Every day | ORAL | Status: DC
  Administered 2013-09-04 – 2013-09-05 (×2): 5 mg via ORAL
  Filled 2013-09-04 (×4): qty 1

## 2013-09-04 NOTE — Progress Notes (Signed)
Psychoeducational Group Note  Date:  09/04/2013 Time:  1015  Group Topic/Focus:  Making Healthy Choices:   The focus of this group is to help patients identify negative/unhealthy choices they were using prior to admission and identify positive/healthier coping strategies to replace them upon discharge.  Participation Level:  Active  Participation Quality:  Appropriate  Affect:  Appropriate  Cognitive:  Oriented  Insight:  Lacking  Engagement in Group:  Engaged  Additional Comments:    Judge, Christine A 09/04/2013  

## 2013-09-04 NOTE — Progress Notes (Signed)
[facility] Group Notes:  (Nursing/MHT/Case Management/Adjunct)  Date:  09/04/2013  Time:  9:25 PM  Type of Therapy:  Psychoeducational Skills  Participation Level:  Minimal  Participation Quality:  Attentive  Affect:  Flat  Cognitive:  Lacking  Insight:  Limited  Engagement in Group:  Resistant  Modes of Intervention:  Education  Summary of Progress/Problems: The patient had little to share with his peers in group except that he anticipates being discharged tomorrow. As a theme for the day, his support system will be made up of an outside agency in [place].   Hazle CocaGOODMAN, Jeremy S 09/04/2013, 9:25 PM

## 2013-09-04 NOTE — [facility] Suicide Risk Assessment (Signed)
Suicide Risk Assessment  Admission Assessment     Nursing information obtained from:    Demographic factors:    Current Mental Status:    Loss Factors:    Historical Factors:    Risk Reduction Factors:    Total Time spent with patient: 45 minutes  CLINICAL FACTORS:   Depression:   Comorbid alcohol abuse/dependence Alcohol/Substance Abuse/Dependencies  COGNITIVE FEATURES THAT CONTRIBUTE TO RISK:  Closed-mindedness Polarized thinking Thought constriction (tunnel vision)    SUICIDE RISK:   Moderate:  Frequent suicidal ideation with limited intensity, and duration, some specificity in terms of plans, no associated intent, good self-control, limited dysphoria/symptomatology, some risk factors present, and identifiable protective factors, including available and accessible social support.  PLAN OF CARE: Supportive approach/coping skills/relapse prevention                               Identify detox needs                               Reassess and address the co morbidities                               Explore options for rehab  I certify that inpatient services furnished can reasonably be expected to improve the patient's condition.  Coby Antrobus A 09/04/2013, 3:38 PM

## 2013-09-04 NOTE — Progress Notes (Signed)
Patient ID: Jeremy ButtnerJames L Rolland, male   DOB: 11-29-1965, 48 y.o.   MRN: [id number] D)  Pt attended group this evening, quiet , shared little, but stated had a good day.   A)  Will continue to monitor for safety, continue POC R)  Safety maintained.

## 2013-09-04 NOTE — [facility] Group Notes (Signed)
[facility] Group Notes:  (Clinical Social Work)  09/04/2013   1:15-2:15PM  Summary of Progress/Problems:  The main focus of today's process group was to   identify the patient's current support system and decide on other supports that can be put in place.  The picture on workbook was used to discuss why additional supports are needed.  An emphasis was placed on using counselor, doctor, therapy groups, 12-step groups, and problem-specific support groups to expand supports.   There was also an extensive discussion about what constitutes a healthy support versus an unhealthy support.  The patient did not want to talk in group.  Type of Therapy:  Process Group  Participation Level:  Minimal  Participation Quality:  Attentive  Affect:  Blunted and Anxious and Depressed  Cognitive:  Alert  Insight:  Limited  Engagement in Therapy:  Limited  Modes of Intervention:  Education,  Support and Con[employer] FoodsProcessing  Shamel Galyean Grossman-Orr, LCSW 09/04/2013, 4:00pm

## 2013-09-04 NOTE — Progress Notes (Signed)
[name] is OOB UAL on the 500 hall tolerated well. HE is pleasant and cooperative . He does not initiate conversation, but is seen sitting in the dayroom throughout the day.   A He completes his self inventory and on it he writes  He denies SI, , he rates his depression and hopelessness "8/7", respectively and he says his DC plan is to " change people, places and things".   R Safety is in place and poc cont.

## 2013-09-04 NOTE — Progress Notes (Signed)
Patient's tennis shoes with strings were removed from locker 54 by nurse per Dr. Runell GessLugo's verbal instructions.  Witnessed by security guard.

## 2013-09-04 NOTE — Progress Notes (Signed)
Psychoeducational Group Note  Date: 09/04/2013 Time:  0930  Group Topic/Focus:  Gratefulness:  The focus of this group is to help patients identify what two things they are most grateful for in their lives. What helps ground them and to center them on their work to their recovery.  Participation Level:  Active  Participation Quality:  Attentive  Affect:  Appropriate  Cognitive:  Oriented  Insight:  Improving  Engagement in Group:  Engaged  Additional Comments:    Judge, Christine A   

## 2013-09-04 NOTE — H&P (Signed)
Psychiatric Admission Assessment Adult  Patient Identification:  [name] Date of Evaluation:  09/04/2013 Chief Complaint:  MDD COCAINE DEPENDENCE THC DEPENDENCE History of Present Illness:: 48 Y/o male who states that he has been increasingly more depressed. Using cocaine every day for a month. States he uses crack. States he smokes pot every once in a while. Drinks once in a while. One 24 ounce beer. States he is prescribed tylenol with codeine for low back pain  (spurs). He has had several admissions to [facility] and has been at [facility] and follows up with the [place]. He has been given psychotropics before but does not seem to follow trough with them. States that coming here he had thoughts of hurting himself. He was staying at a boarding house but states he is not going back there. States he has had other residents stand in front of his door trying to sneak on him and he does not appreciate this  Associated Signs/Synptoms: Depression Symptoms:  depressed mood, anhedonia, fatigue, suicidal thoughts without plan, anxiety, loss of energy/fatigue, (Hypo) Manic Symptoms:  Denies Anxiety Symptoms:  Excessive Worry, Psychotic Symptoms:  States that sometimes he hears  sounds like  somebody is at the door, but there is no one.  Sometimes he feels a presence on top of him. States this has been going on and off for years. States he believes in spirits.  PTSD Symptoms: Negative Total Time spent with patient: 45 minutes  Psychiatric Specialty Exam: Physical Exam  Review of Systems  Constitutional: Positive for malaise/fatigue.  HENT: Positive for tinnitus.   Eyes: Negative.   Respiratory: Negative.   Cardiovascular: Positive for palpitations.  Gastrointestinal: Positive for diarrhea.  Genitourinary: Positive for urgency and frequency.  Musculoskeletal: Positive for back pain and joint pain.  Skin: Negative.   Neurological: Positive for dizziness, weakness and headaches.   Endo/Heme/Allergies: Negative.   Psychiatric/Behavioral: Positive for depression and substance abuse. The patient is nervous/anxious.     Blood pressure 145/89, pulse 63, temperature 97.8 F (36.6 C), temperature source Oral, resp. rate 16, height _0  (1.727 m), weight 78.472 kg (173 lb), SpO2 99.00%.Body mass index is 26.31 kg/(m^2).  General Appearance: Fairly Groomed  Engineer, water::  Minimal  Speech:  Clear and Coherent, Slow and not spontaneous  Volume:  Decreased  Mood:  Anxious, Depressed and worried  Affect:  Restricted  Thought Process:  Coherent and Goal Directed  Orientation:  Full (Time, Place, and Person)  Thought Content:  symptoms, worries, cocerns  Suicidal Thoughts:  Yes.  without intent/plan  Homicidal Thoughts:  No  Memory:  Immediate;   Fair Recent;   Fair Remote;   Fair  Judgement:  Fair  Insight:  Present and Shallow  Psychomotor Activity:  Restlessness  Concentration:  Fair  Recall:  [employer] of Knowledge:NA  Language: Fair  Akathisia:  No  Handed:    AIMS (if indicated):     Assets:  Desire for Improvement  Sleep:  Number of Hours: 6    Musculoskeletal: Strength & Muscle Tone: within normal limits Gait & Station: normal Patient leans: N/A  Past Psychiatric History: Diagnosis:  Hospitalizations: [place],  [facility] [place]  Substance Abuse Care: [facility], [facility] Pester has been able to abstain for 3-4 months. States both times went back home, and wife was still using (triggered him)  Self-Mutilation: Denies  Suicidal Attempts: Tried to ride his bicycle into traffic: lack of work, no food in the house, very depressed  Violent  Behaviors: Denies   Past Medical History:   Past Medical History  Diagnosis Date  . DVT (deep venous thrombosis) 01/08/2011    lt leg  . DVT of lower limb, acute 01/12/2011  . BPH (benign prostatic hyperplasia)   . Current use of long term anticoagulation   . 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 Noncompliance w/medication treatment due to intermit use of medication   . History of tobacco use     QUIT early 2013  . Hypertension   . Depression   . Enlarged prostate     Allergies:  No Known Allergies PTA Medications: Prescriptions prior to admission  Medication Sig Dispense Refill  . Cholecalciferol (VITAMIN D PO) Take 2,000 Units by mouth daily.       . Rivaroxaban (XARELTO) 20 MG TABS Take 20 mg by mouth every morning.       Marland Kitchen acetaminophen (TYLENOL) 650 MG CR tablet Take 1,300 mg by mouth 3 (three) times daily as needed for pain.      Marland Kitchen acetaminophen-codeine (TYLENOL #3) 300-30 MG per tablet Take 1 tablet by mouth 3 (three) times daily as needed for moderate pain.      . cyclobenzaprine (FLEXERIL) 10 MG tablet Take 10 mg by mouth 3 (three) times daily as needed for muscle spasms.      . Multiple Vitamins-Minerals (MULTIVITAMIN WITH MINERALS) tablet Take 1 tablet by mouth daily.      . tamsulosin (FLOMAX) 0.4 MG CAPS capsule Take 0.4 mg by mouth at bedtime.         Previous Psychotropic Medications:  Medication/Dose    Seroquel Trazodone              Substance Abuse History in the last 12 months:  Yes.    Consequences of Substance Abuse: Legal Consequences:  one drug related charges  Social History:  reports that he quit smoking about 2 years ago. His smoking use included Cigars. He has never used smokeless tobacco. He reports that he drinks about 1.2 ounces of alcohol per week. He reports that he uses illicit drugs (Cocaine and Marijuana) about 3 times per week. Additional Social History: Pain Medications: 0 Prescriptions: 0 Over the Counter: 0 History of alcohol / drug use?: Yes Longest period of sobriety (when/how long): 2 months Negative Consequences of Use: Personal relationships;Financial Withdrawal Symptoms:  (none) Name of Substance 1: Cocaine 1 - Age of First Use: 25 1 - Amount (size/oz): $10-50 1 - Frequency: daily 1 -  Duration: ongoing for 16 years 1 - Last Use / Amount: 09/02/13-$20 Name of Substance 2: Marijuana 2 - Age of First Use: 14 2 - Amount (size/oz): < 1 joint 2 - Frequency: 1 x every 2-3 weeks 2 - Duration: ongoing for 20 + years 2 - Last Use / Amount: 09/01/13-2 puffs of a joint                Current Place of Residence:  "I have no where" was staying at a [place] go back there Place of Birth:   Family Members: Marital Status:  Separated Children:  Sons: 50, 84  Daughters: 79 ( step daughter) , 69, 48  Relationships: Education:  2 years of college Educational Problems/Performance: Religious Beliefs/Practices: Baptist History of Abuse (Emotional/Phsycial/Sexual) Denies Pensions consultant; Security work, Physicist, medical up, warehoused, Patent attorney, flagging, [employer] as of lately Estate manager/land agent History:  [employer] hurt ankle had to get  out. Was there for 3 1/2 years did not want to get out.  Legal History: Denies Hobbies/Interests:  Family History:  History reviewed. No pertinent family history.  Results for orders placed during the hospital encounter of 09/02/13 (from the past 72 hour(s))  ACETAMINOPHEN LEVEL     Status: None   Collection Time    09/02/13 12:00 PM      Result Value Ref Range   Acetaminophen (Tylenol), Serum <15.0  10 - 30 ug/mL   Comment:            THERAPEUTIC CONCENTRATIONS VARY     SIGNIFICANTLY. A RANGE OF 10-30     ug/mL MAY BE AN EFFECTIVE     CONCENTRATION FOR MANY PATIENTS.     HOWEVER, SOME ARE BEST TREATED     AT CONCENTRATIONS OUTSIDE THIS     RANGE.     ACETAMINOPHEN CONCENTRATIONS     >150 ug/mL AT 4 HOURS AFTER     INGESTION AND >50 ug/mL AT 12     HOURS AFTER INGESTION ARE     OFTEN ASSOCIATED WITH TOXIC     REACTIONS.  CBC     Status: None   Collection Time    09/02/13 12:00 PM      Result Value Ref Range   WBC 4.6  4.0 - 10.5 K/uL   RBC 4.48  4.22 - 5.81 MIL/uL   Hemoglobin 14.8  13.0 - 17.0 g/dL    HCT 44.1  39.0 - 52.0 %   MCV 98.4  78.0 - 100.0 fL   MCH 33.0  26.0 - 34.0 pg   MCHC 33.6  30.0 - 36.0 g/dL   RDW 13.3  11.5 - 15.5 %   Platelets 237  150 - 400 K/uL  COMPREHENSIVE METABOLIC PANEL     Status: Abnormal   Collection Time    09/02/13 12:00 PM      Result Value Ref Range   Sodium 137  137 - 147 mEq/L   Potassium 4.0  3.7 - 5.3 mEq/L   Chloride 98  96 - 112 mEq/L   CO2 24  19 - 32 mEq/L   Glucose, Bld 96  70 - 99 mg/dL   BUN 14  6 - 23 mg/dL   Creatinine, Ser 1.16  0.50 - 1.35 mg/dL   Calcium 9.3  8.4 - 10.5 mg/dL   Total Protein 7.4  6.0 - 8.3 g/dL   Albumin 4.0  3.5 - 5.2 g/dL   AST 22  0 - 37 U/L   ALT 33  0 - 53 U/L   Alkaline Phosphatase 64  39 - 117 U/L   Total Bilirubin 0.3  0.3 - 1.2 mg/dL   GFR calc non Af Amer 73 (*) >90 mL/min   GFR calc Af Amer 84 (*) >90 mL/min   Comment: (NOTE)     The eGFR has been calculated using the CKD EPI equation.     This calculation has not been validated in all clinical situations.     eGFR's persistently <90 mL/min signify possible Chronic Kidney     Disease.  ETHANOL     Status: None   Collection Time    09/02/13 12:00 PM      Result Value Ref Range   Alcohol, Ethyl (B) <11  0 - 11 mg/dL   Comment:            LOWEST DETECTABLE LIMIT FOR     SERUM ALCOHOL IS 11 mg/dL  FOR MEDICAL PURPOSES ONLY  SALICYLATE LEVEL     Status: Abnormal   Collection Time    09/02/13 12:00 PM      Result Value Ref Range   Salicylate Lvl <5.1 (*) 2.8 - 20.0 mg/dL  URINE RAPID DRUG SCREEN (HOSP PERFORMED)     Status: Abnormal   Collection Time    09/02/13  1:26 PM      Result Value Ref Range   Opiates POSITIVE (*) NONE DETECTED   Cocaine POSITIVE (*) NONE DETECTED   Benzodiazepines NONE DETECTED  NONE DETECTED   Amphetamines NONE DETECTED  NONE DETECTED   Tetrahydrocannabinol POSITIVE (*) NONE DETECTED   Barbiturates NONE DETECTED  NONE DETECTED   Comment:            DRUG SCREEN FOR MEDICAL PURPOSES     ONLY.  IF CONFIRMATION  IS NEEDED     FOR ANY PURPOSE, NOTIFY LAB     WITHIN 5 DAYS.                LOWEST DETECTABLE LIMITS     FOR URINE DRUG SCREEN     Drug Class       Cutoff (ng/mL)     Amphetamine      1000     Barbiturate      200     Benzodiazepine   025     Tricyclics       852     Opiates          300     Cocaine          300     THC              50   Psychological Evaluations:  Assessment:   DSM5:  Schizophrenia Disorders:  Delusional Disorder (297.1) R/O Obsessive-Compulsive Disorders:  None Trauma-Stressor Disorders:  none Substance/Addictive Disorders:  Cocaine Use Disorder Depressive Disorders:  Major Depressive Disorder - Severe (296.23) R/O psychotic symptoms  AXIS I:  Substance Induced Mood Disorder AXIS II:  Deferred AXIS III:   Past Medical History  Diagnosis Date  . DVT (deep venous thrombosis) 01/08/2011    lt leg  . DVT of lower limb, acute 01/12/2011  . BPH (benign prostatic hyperplasia)   . Current use of long term anticoagulation   . 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 Noncompliance w/medication treatment due to intermit use of medication   . History of tobacco use     QUIT early 2013  . Hypertension   . Depression   . Enlarged prostate    AXIS IV:  other psychosocial or environmental problems AXIS V:  41-50 serious symptoms  Treatment Plan/Recommendations:  Supportive approach/coping skills/relapse prevention                                                                 Further reassess the nature of the mood disorder  Further reassess the presence of psychotic symptoms,                                                                     vs culture based                                                                 Trial with an SSRI Treatment Plan Summary: Daily contact with patient to assess and evaluate symptoms and progress in  treatment Medication management Current Medications:  Current Facility-Administered Medications  Medication Dose Route Frequency Provider Last Rate Last Dose  . acetaminophen (TYLENOL) tablet 650 mg  650 mg Oral Q6H PRN Delfin Gant, NP   650 mg at 09/04/13 7619  . alum & mag hydroxide-simeth (MAALOX/MYLANTA) 200-200-20 MG/5ML suspension 30 mL  30 mL Oral Q4H PRN Delfin Gant, NP      . cyclobenzaprine (FLEXERIL) tablet 10 mg  10 mg Oral TID PRN Delfin Gant, NP   10 mg at 09/04/13 5093  . magnesium hydroxide (MILK OF MAGNESIA) suspension 30 mL  30 mL Oral Daily PRN Delfin Gant, NP      . rivaroxaban (XARELTO) tablet 20 mg  20 mg Oral Q supper Delfin Gant, NP   20 mg at 09/03/13 1825  . tamsulosin (FLOMAX) capsule 0.4 mg  0.4 mg Oral QHS Delfin Gant, NP        Observation Level/Precautions:  15 minute checks  Laboratory:  As per the ED  Psychotherapy:  Individual/group  Medications:  Consider Detox needs/reassess and address the co morbities  Consultations:    Discharge Concerns:  Need for a residential treatment center  Estimated LOS: 3-5 days  Other:     I certify that inpatient services furnished can reasonably be expected to improve the patient's condition.   Evangelyne Loja A 6/28/201510:54 AM

## 2013-09-05 DIAGNOSIS — F332 Major depressive disorder, recurrent severe without psychotic features: Principal | ICD-10-CM

## 2013-09-05 DIAGNOSIS — F1910 Other psychoactive substance abuse, uncomplicated: Secondary | ICD-10-CM

## 2013-09-05 MED ORDER — ESCITALOPRAM OXALATE 10 MG PO TABS
10.0000 mg | ORAL_TABLET | Freq: Every day | ORAL | Status: DC
  Administered 2013-09-06 – 2013-09-07 (×2): 10 mg via ORAL
  Filled 2013-09-05 (×4): qty 1

## 2013-09-05 NOTE — [facility] Suicide Risk Assessment (Signed)
[facility] Family/Significant Other Suicide Prevention Education  Suicide Prevention Education:   Patient Refusal for Family/Significant Other Suicide Prevention Education: The patient has refused to provide written consent for family/significant other to be provided Family/Significant Other Suicide Prevention Education during admission and/or prior to discharge.  Physician notified.  CSW provided suicide prevention information with patient.    The suicide prevention education provided includes the following:  Suicide risk factors  Suicide prevention and interventions  National Suicide Hotline telephone number  [facility] assessment telephone number  [facility] Emergency Assistance 911  [facility] and/or Residential Mobile Crisis Unit telephone number   Reyes IvanChelsea Horton, [place] 09/05/2013 10:56 AM

## 2013-09-05 NOTE — Progress Notes (Signed)
Patient ID: Jeremy ButtnerJames L Thomas, male   DOB: [birth year], 48 y.o.   MRN: [id number] D)  Has been out and about on the hall this evening, smiling and pleasant, states is feeling better, happy to be in clothes instead of scrubs, and to have his shoes.  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  Stated had a little discomfort in his back but asked for heat pack instead of medication.  States has had a good day on the unit, that there has been a lot of laughter, which has been good. Denies thoughts of self harm,  but admits to feeling anxious at times, not quite sure about d/c plans. A)  Will continue to monitor for safety, continue POC R)  Safety maintained..Marland Kitchen

## 2013-09-05 NOTE — [facility] Group Notes (Signed)
[facility] Note   09/05/2013 8:45 AM  Participation Quality:  Alert, Appropriate and Oriented  Mood/Affect:  Flat and Depressed  Depression Rating:  6-7  Anxiety Rating:  6-7  Thoughts of Suicide:  Pt denies SI/HI but states "not really"  Will you contract for safety?   Yes  Current AVH:  Pt denies  Plan for Discharge/Comments:  Pt attended discharge planning group and actively participated in group.  CSW provided pt with today's workbook.  Pt states that he is unsure of his d/c plans at this time.  Pt wants further inpatient treatment.  CSW will assess for appropriate referrals. No further needs voiced by pt at this time.    Transportation Means: Pt reports access to transportation   Supports: No supports mentioned at this time  Jeremy IvanChelsea Horton, LCSW 09/05/2013 10:11 AM

## 2013-09-05 NOTE — Progress Notes (Signed)
Child/Adolescent Psychoeducational Group Note  Date:  09/05/2013 Time:  10:02 PM  Group Topic/Focus:  Wrap-Up Group:   The focus of this group is to help patients review their daily goal of treatment and discuss progress on daily workbooks.  Participation Level:  Active  Participation Quality:  Appropriate  Affect:  Appropriate  Cognitive:  Appropriate  Insight:  Appropriate  Engagement in Group:  Engaged  Modes of Intervention:  Discussion  Additional Comments:  Pt attended the wrap up group this evening and remained appropriate and engaged throughout the duration of the group. Pt ranked his day as a 7 because he made all of the groups. Pt also shared that one positive thing about his day was the fact that he got to talk to people today.  Fara Oldeneese, Jonathon O 09/05/2013, 10:02 PM

## 2013-09-05 NOTE — Progress Notes (Signed)
[facility]  09/05/2013 4:08 PM Gilman ButtnerJames L Dutch  MRN:  [id number]  Subjective:   Still feeling depressed and hopeless.  Objective Patient seen chart reviewed.  The patient is a 48 year old man who was admitted because of severe depression and having suicidal thoughts.  He was using cocaine and marijuana.  His UDS was positive for cocaine marijuana and opiates.  He is prescribed for chronic pain medication.  Patient continued to endorse suicidal thoughts and feeling of hopelessness and worthlessness.  He is sleeping on and off.  Patient was living in a boarding house but now homeless.  He lost his job.  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  He admitted using drugs to deal with his depression and numbness.  The patient has history of drug and rehabilitation in the past.  Patient denies any homicidal thoughts or any paranoia but endorsed anhedonia and significant depressed and hopeless.  [facility] Patient was started on Lexapro 5 mg.  He does not report any side effects.  He has limited participation in groups.  Diagnosis:   Substance/Addictive Disorders:  Cannabis Use Disorder - Mild (305.20) Depressive Disorders:  Major Depressive Disorder - Moderate (296.22) Total Time spent with patient: 20 minutes  Axis I: Major Depression, Recurrent severe and Substance Abuse Axis II: Deferred Axis III:  Past Medical History  Diagnosis Date  . DVT (deep venous thrombosis) 01/08/2011    lt leg  . DVT of lower limb, acute 01/12/2011  . BPH (benign prostatic hyperplasia)   . Current use of long term anticoagulation   . 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 Noncompliance w/medication treatment due to intermit use of medication   . History of tobacco use     QUIT early 2013  . Hypertension   . Depression   . Enlarged prostate    Axis IV: economic problems, housing problems, other psychosocial or environmental problems, problems  related to social environment and problems with primary support group  ADL's:  Intact  Sleep: Poor  Appetite:  Fair  Psychiatric Specialty Exam: Physical Exam  ROS  Blood pressure 117/82, pulse 76, temperature 97 F (36.1 C), temperature source Oral, resp. rate 18, height 5\' 8"  (1.727 m), weight 173 lb (78.472 kg), SpO2 99.00%.Body mass index is 26.31 kg/(m^2).  General Appearance: Disheveled and Fairly Groomed  Patent attorneyye Contact::  Fair  Speech:  Slow  Volume:  Decreased  Mood:  Anxious, Depressed and Dysphoric  Affect:  Constricted, Depressed and Restricted  Thought Process:  Logical  Orientation:  Full (Time, Place, and Person)  Thought Content:  Rumination  Suicidal Thoughts:  Yes.  without intent/plan  Homicidal Thoughts:  No  Memory:  Immediate;   Fair Recent;   Fair Remote;   Fair  Judgement:  Impaired  Insight:  Lacking  Psychomotor Activity:  Decreased  Concentration:  Fair  Recall:  FiservFair  Fund of Knowledge:Fair  Language: Fair  Akathisia:  No  Handed:  Right  AIMS (if indicated):     Assets:  Communication Skills Desire for Improvement  Sleep:  Number of Hours: 6   Musculoskeletal: Strength & Muscle Tone: within normal limits Gait & Station: normal Patient leans: N/A  Current Medications: Current Facility-Administered Medications  Medication Dose Route Frequency Provider Last Rate Last Dose  . acetaminophen (TYLENOL) tablet 650 mg  650 mg Oral Q6H PRN Earney NavyJosephine C Onuoha, NP   650 mg at 09/05/13  [id number]  . alum & mag hydroxide-simeth (MAALOX/MYLANTA) 200-200-20 MG/5ML suspension 30 mL  30 mL Oral Q4H PRN Earney NavyJosephine C Onuoha, NP      . cyclobenzaprine (FLEXERIL) tablet 10 mg  10 mg Oral TID PRN Earney NavyJosephine C Onuoha, NP   10 mg at 09/05/13 0946  . escitalopram (LEXAPRO) tablet 5 mg  5 mg Oral Daily Rachael FeeIrving A Lugo, MD   5 mg at 09/05/13 [phone]  . magnesium hydroxide (MILK OF MAGNESIA) suspension 30 mL  30 mL Oral Daily PRN Earney NavyJosephine C Onuoha, NP      . rivaroxaban  (XARELTO) tablet 20 mg  20 mg Oral Q supper Earney NavyJosephine C Onuoha, NP   20 mg at 09/04/13 2006  . tamsulosin (FLOMAX) capsule 0.4 mg  0.4 mg Oral QHS Earney NavyJosephine C Onuoha, NP   0.4 mg at 09/04/13 2334    Lab Results: No results found for this or any previous visit (from the past 48 hour(s)).  Physical Findings: AIMS:  , ,  ,  ,    CIWA:  CIWA-Ar Total: 3 COWS:  COWS Total Score: 2  Treatment Plan Summary: Daily contact with patient to assess and evaluate symptoms and progress in treatment Medication management increase Lexapro 10 mg.  Continue to encourage to participate in groups.    Medical Decision Making Problem Points:  Established problem, worsening (2), Review of last therapy session (1) and Review of psycho-social stressors (1) Data Points:  Review and summation of old records (2) Review of medication regiment & side effects (2) Review of new medications or change in dosage (2)  I certify that inpatient services furnished can reasonably be expected to improve the patient's condition.   ARFEEN,SYED T. 09/05/2013, 4:08 PM

## 2013-09-05 NOTE — Progress Notes (Signed)
Patient ID: [name], male   DOB: 1965/07/05, 48 y.o.   MRN: [id number]  D: Pt. Denies SI/HI and A/V Hallucinations. Patient does report pain in lower back. PRN Tylenol was given to patient for this pain but patient reported no change on reassessment. Patient received PRN Flexeril for spasms an hour after. Patient rates his depression at 8/10 and his hopelessness at 6/10 for the day. Patient reported that his energy level is normal and his ability to pay attention is improving.  A: Support and encouragement provided to the patient. Scheduled medications administered to patient per physician's orders.  R: Patient is receptive and cooperative but minimal with this Clinical research associatewriter. Patient is pleasant with staff and peers. Patient is seen in the milieu and is attending groups. Q15 minute checks are maintained for safety.

## 2013-09-05 NOTE — B[facility] Counselor (Signed)
Adult Comprehensive Assessment  Patient ID: [name], male   DOB: [birth year], 48 y.o.   MRN: [id number]  Information Source: Information source: Patient  Current Stressors:  Employment / Job issues: unemployed Family Relationships: lack of support from some family members Surveyor, quantityinancial / Lack of resources (include bankruptcy): no income right now Housing / Lack of housing: now homeless Substance abuse: cocaine abuse  Living/Environment/Situation:  Living Arrangements: Other (Comment) Living conditions (as described by patient or guardian): Pt was staying at a boarding house but due to being unemployed he is now homeless How long has patient lived in current situation?: homeless since admission here What is atmosphere in current home: Temporary  Family History:  Marital status: Separated Separated, when?: 5 years ago What types of issues is patient dealing with in the relationship?: both were using drugs, infidelity Additional relationship information: N/A Does patient have children?: Yes How many children?: 5 How is patient's relationship with their children?: Strained relationships with children  Childhood History:  By whom was/is the patient raised?: Both parents Additional childhood history information: Pt describes his childhood as good Description of patient's relationship with caregiver when they were a child: Pt reports getting along well with parents growing up.  Patient's description of current relationship with people who raised him/her: Mom is deceased, father is supportive to pt Does patient have siblings?: Yes Number of Siblings: 4 Description of patient's current relationship with siblings: brothers - strained relationship with them  Did patient suffer any verbal/emotional/physical/sexual abuse as a child?: No Did patient suffer from severe childhood neglect?: No Has patient ever been sexually abused/assaulted/raped as an adolescent or adult?: No Was the patient  ever a victim of a crime or a disaster?: No Witnessed domestic violence?: No Has patient been effected by domestic violence as an adult?: No  Education:  Highest grade of school patient has completed: some college Currently a Consulting civil engineerstudent?: No Learning disability?: No  Employment/Work Situation:   Employment situation: Unemployed Patient's job has been impacted by current illness: No What is the longest time patient has a held a job?: 7 years 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 patient ever been in the [employer] and Companymilitary?: Yes (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) Has patient ever served in combat?: No  Financial Resources:   Surveyor, quantityinancial resources: [employer] SSDI;Food stamps;No income Does patient have a representative payee or guardian?: No  Alcohol/Substance Abuse:   What has been your use of drugs/alcohol within the last 12 months?: Cocaine - $10-50 daily for the last 16 years off and on, Marijuana - 1 joint every 2-3 weeks for the last 20 years If attempted suicide, did drugs/alcohol play a role in this?: No Alcohol/Substance Abuse Treatment Hx: Past Tx, Inpatient;Past detox;Past Tx, Outpatient If yes, describe treatment: [facility] - 1 years ago, [facility] -1 year ago, [facility] Has alcohol/substance abuse ever caused legal problems?: No  Social Support System:   Forensic psychologistatient's Community Support System: Poor Describe Community Support System: Pt reports having a lack of support Type of faith/religion: Baptist How does patient's faith help to cope with current illness?: Pray  Leisure/Recreation:   Leisure and Hobbies: Basketball  Strengths/Needs:   What things does the patient do well?: hard worker In what areas does patient struggle / problems for patient: Depression, anxiety, substance abuse, SI  Discharge Plan:   Does patient have access to transportation?: Yes Will patient be returning to same living situation after discharge?: Yes Currently  receiving community  mental health services: Yes (From Whom) [facility] If no, would patient like referral for services when discharged?: Yes (What county?) [facility]([place]) Does patient have financial barriers related to discharge medications?: No  Summary/Recommendations:     Patient is a 48 year old African American Male with a diagnosis of Major Depressive Disorder, Recurrent, Severe With Psychotic Features, Cocaine Dependence.  [place] homeless in [place].  Pt reports being unemployed, homeless and continuing to use cocaine daily which pt reports is negatively impacting his life.  Patient will benefit from crisis stabilization, medication evaluation, group therapy and psycho education in addition to case management for discharge planning.    [name]. 09/05/2013

## 2013-09-05 NOTE — Clinical Social Work Note (Signed)
Pt requesting further inpatient treatment from here and is homeless now in [place].  [facility] and was told by Trey PaulaJeff there that pt has been there twice in the past and they are declining him and that [facility] needs [facility].  CSW made the referral to [facility] at this time.  CSW to monitor this referral.    Reyes IvanChelsea Horton, LCSW 09/05/2013  12:21 PM

## 2013-09-05 NOTE — B[facility] Group Notes (Signed)
[facility] LCSW Group Therapy  09/05/2013   1:15 PM   Type of Therapy:  Group Therapy  Participation Level:  Active  Participation Quality:  Attentive, Sharing and Supportive  Affect:  Depressed and Flat  Cognitive:  Alert and Oriented  Insight:  Developing/Improving and Engaged  Engagement in Therapy:  Developing/Improving and Engaged  Modes of Intervention:  Clarification, Confrontation, Discussion, Education, Exploration, Limit-setting, Orientation, Problem-solving, Rapport Building, Dance movement psychotherapisteality Testing, Socialization and Support  Summary of Progress/Problems: Pt identified obstacles faced currently and processed barriers involved in overcoming these obstacles. Pt identified steps necessary for overcoming these obstacles and explored motivation (internal and external) for facing these difficulties head on. Pt further identified one area of concern in their lives and chose a goal to focus on for today. Pt was able to process his history of substance abuse and identifies this as his biggest obstacle.  Pt states that he's been using on and off since 1996, with periods of sobriety.  Pt states that he can't blame anyone else as where ever he moves to, he continues to use.  Pt states that he was able to stay clean by staying busy and changing his environment, which he plans to do upon d/c from here.  Pt was able to identify with a peer, on how drug use can and has negatively impacted the relationship one has with their children.  Pt actively participated and was engaged in group discussion.    Jeremy IvanChelsea Horton, LCSW 09/05/2013  2:54 PM

## 2013-09-06 NOTE — Progress Notes (Signed)
The focus of this group is to educate the patient on the purpose and policies of crisis stabilization and provide a format to answer questions about their admission.  The group details unit policies and expectations of patients while admitted. Patient did not attend this group. 

## 2013-09-06 NOTE — [facility] Group Notes (Signed)
[facility] LCSW Group Therapy  09/06/2013   1:15 PM   Type of Therapy:  Group Therapy  Participation Level:  Active  Participation Quality:  Attentive, Sharing and Supportive  Affect:  Depressed and Flat  Cognitive:  Alert and Oriented  Insight:  Developing/Improving and Engaged  Engagement in Therapy:  Developing/Improving and Engaged  Modes of Intervention:  Activity, Clarification, Confrontation, Discussion, Education, Exploration, Limit-setting, Orientation, Problem-solving, Rapport Building, Reality Testing, Socialization and Support  Summary of Progress/Problems: Patient was attentive and engaged with speaker from [employer].  Patient was attentive to speaker while they shared their story of dealing with mental health and overcoming it.  Patient expressed interest in their programs and services and received information on their agency.  Patient processed ways they can relate to the speaker.     [name], LCSW 09/06/2013  2:10 PM    

## 2013-09-06 NOTE — Tx Team (Signed)
Interdisciplinary Treatment Plan Update (Adult)  Date: 09/06/2013  Time Reviewed:  9:45 AM  Progress in Treatment: Attending groups: Yes Participating in groups:  Yes Taking medication as prescribed:  Yes Tolerating medication:  Yes Family/Significant othe contact made: No, pt refused Patient understands diagnosis:  Yes Discussing patient identified problems/goals with staff:  Yes Medical problems stabilized or resolved:  Yes Denies suicidal/homicidal ideation: Yes Issues/concerns per patient self-inventory:  Yes Other:  New problem(s) identified: N/A  Discharge Plan or Barriers:  Pt is interested in going for further inpatient treatment.  [facility]  [facility]   Reason for Continuation of Hospitalization: Anxiety Depression Medication Stabilization  Comments: N/A  Estimated length of stay: 3-5 days  For review of initial/current patient goals, please see plan of care.  Attendees: Patient:     Family:     Physician:  Dr. Lolly MustacheArfeen 09/06/2013 9:12 AM   Nursing:  Liborio NixonPatrice White, RN 09/06/2013 9:12 AM   Clinical Social Worker:  Reyes Ivanhelsea Horton, LCSW 09/06/2013 9:12 AM   Other: Juline PatchQuylle Hodnett, LCSW 09/06/2013 9:12 AM   Other:  York RamZach Brooks, Alexander MtLCSW, assistant director 09/06/2013 9:12 AM   Other:  Leisa LenzValerie Enoch, care coordination 09/06/2013 9:12 AM   Other:  Leighton ParodyBritney Tyson, RN 09/06/2013 9:22 AM   Other:    Other:    Other:    Other:    Other:      Scribe for Treatment Team:   Carmina MillerHorton, Chelsea Nicole, 09/06/2013 , 9:12 AM

## 2013-09-06 NOTE — Progress Notes (Signed)
D: Patient denies SI/HI and A/V hallucinations; patient reports sleep is poor and reporting that he was having nightmares and he refused to take his lexapro; reports appetite is good; reports energy level is low ; reports ability to pay attention is improving; rates depression as 5/10; rates hopelessness 6/10; rates anxiety as 2/10;   A: Monitored q 15 minutes; patient encouraged to attend groups; patient educated about medications; patient given medications per physician orders; patient encouraged to express feelings and/or concerns  R: Patient reports that he did not rest much and that most of the morning ;patient's interaction with staff and peers is appropriate but flat and sad; patient was able to set goal to talk with staff 1:1 when having feelings of SI; patient is taking medications as prescribed and tolerating medications; patient is attending some groups

## 2013-09-06 NOTE — Progress Notes (Signed)
[facility] Progress Note  09/06/2013 1:55 PM Gilman ButtnerJames L Brar  MRN:  [id number]  Subjective:   I am having nightly dreams.  I still feel depressed.    Objective Patient seen chart reviewed.  Patient remains very sad depressed and continued to endorse suicidal thoughts.  He also endorse nightly dreams and is scared of the night.  He is taking Lexapro.  He endorse hopelessness and worthlessness.  He denies any hallucination.  He ruminates about his drug use.  He denies any other concern.  He is going to the groups .  He is concerned about his living situation.  He wants to do [facility] or [facility].  Patient endorsed that he needed long-term treatment.  He does not want to relapse into drugs.  Is complaining of back pain and is taking Flexeril.    Diagnosis:   Substance/Addictive Disorders:  Cannabis Use Disorder - Mild (305.20) Depressive Disorders:  Major Depressive Disorder - Moderate (296.22) Total Time spent with patient: 20 minutes  Axis I: Major Depression, Recurrent severe and Substance Abuse Axis II: Deferred Axis III:  Past Medical History  Diagnosis Date  . DVT (deep venous thrombosis) 01/08/2011    lt leg  . DVT of lower limb, acute 01/12/2011  . BPH (benign prostatic hyperplasia)   . Current use of long term anticoagulation   . 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 Noncompliance w/medication treatment due to intermit use of medication   . History of tobacco use     QUIT early 2013  . Hypertension   . Depression   . Enlarged prostate    Axis IV: economic problems, housing problems, other psychosocial or environmental problems, problems related to social environment and problems with primary support group  ADL's:  Intact  Sleep: Poor  Appetite:  Fair  Psychiatric Specialty Exam: Physical Exam  ROS  Blood pressure 148/86, pulse 90, temperature 97.8 F (36.6 C), temperature source Oral, resp. rate 20, height 5'  8" (1.727 m), weight 173 lb (78.472 kg), SpO2 99.00%.Body mass index is 26.31 kg/(m^2).  General Appearance: Disheveled and Fairly Groomed  Patent attorneyye Contact::  Fair  Speech:  Slow  Volume:  Decreased  Mood:  Anxious, Depressed and Dysphoric  Affect:  Constricted, Depressed and Restricted  Thought Process:  Logical  Orientation:  Full (Time, Place, and Person)  Thought Content:  Rumination  Suicidal Thoughts:  Yes.  without intent/plan  Homicidal Thoughts:  No  Memory:  Immediate;   Fair Recent;   Fair Remote;   Fair  Judgement:  Impaired  Insight:  Lacking  Psychomotor Activity:  Decreased  Concentration:  Fair  Recall:  FiservFair  Fund of Knowledge:Fair  Language: Fair  Akathisia:  No  Handed:  Right  AIMS (if indicated):     Assets:  Communication Skills Desire for Improvement  Sleep:  Number of Hours: 6   Musculoskeletal: Strength & Muscle Tone: within normal limits Gait & Station: normal Patient leans: N/A  Current Medications: Current Facility-Administered Medications  Medication Dose Route Frequency Provider Last Rate Last Dose  . acetaminophen (TYLENOL) tablet 650 mg  650 mg Oral Q6H PRN Earney NavyJosephine C Onuoha, NP   650 mg at 09/06/13 0754  . alum & mag hydroxide-simeth (MAALOX/MYLANTA) 200-200-20 MG/5ML suspension 30 mL  30 mL Oral Q4H PRN Earney NavyJosephine C Onuoha, NP      . cyclobenzaprine (FLEXERIL) tablet 10 mg  10 mg Oral TID PRN  Earney NavyJosephine C Onuoha, NP   10 mg at 09/06/13 0506  . escitalopram (LEXAPRO) tablet 10 mg  10 mg Oral Daily Cleotis NipperSyed T Sayda Grable, MD      . magnesium hydroxide (MILK OF MAGNESIA) suspension 30 mL  30 mL Oral Daily PRN Earney NavyJosephine C Onuoha, NP      . rivaroxaban (XARELTO) tablet 20 mg  20 mg Oral Q supper Earney NavyJosephine C Onuoha, NP   20 mg at 09/05/13 1708  . tamsulosin (FLOMAX) capsule 0.4 mg  0.4 mg Oral QHS Earney NavyJosephine C Onuoha, NP   0.4 mg at 09/05/13 2144    Lab Results: No results found for this or any previous visit (from the past 48 hour(s)).  Physical  Findings: AIMS:  , ,  ,  ,    CIWA:  CIWA-Ar Total: 3 COWS:  COWS Total Score: 2  Treatment Plan Summary: Daily contact with patient to assess and evaluate symptoms and progress in treatment Medication management continue Lexapro 10 mg.  it is unclear if Lexapro causing nightmares.  However if patient continues to have nightmares that we will consider switching to a different antidepressant.  Continue to encourage to participate in groups.  Medical Decision Making Problem Points:  Established problem, worsening (2), Review of last therapy session (1) and Review of psycho-social stressors (1) Data Points:  Review and summation of old records (2) Review of medication regiment & side effects (2) Review of new medications or change in dosage (2)  I certify that inpatient services furnished can reasonably be expected to improve the patient's condition.   Devario Bucklew T. 09/06/2013, 1:55 PM

## 2013-09-06 NOTE — Progress Notes (Signed)
Recreation Therapy Notes  Animal-Assisted Activity/Therapy (AAA/T) Program Checklist/Progress Notes Patient Eligibility Criteria Checklist & Daily Group note for Rec Tx Intervention  Date: 06.30.2015 Time: 3:15pm Location: [address] Dayroom    AAA/T Program Assumption of Risk Form signed by Patient/ or Parent Legal Guardian yes  Patient is free of allergies or sever asthma yes  Patient reports no fear of animals yes  Patient reports no history of cruelty to animals yes   Patient understands his/her participation is voluntary yes  Behavioral Response: Did not attend.   [name] L [name], LRT/CTRS  [name] L 09/06/2013 4:36 PM 

## 2013-09-07 MED ORDER — DULOXETINE HCL 30 MG PO CPEP
30.0000 mg | ORAL_CAPSULE | Freq: Every day | ORAL | Status: DC
  Administered 2013-09-07 – 2013-09-08 (×2): 30 mg via ORAL
  Filled 2013-09-07 (×5): qty 1

## 2013-09-07 NOTE — Progress Notes (Signed)
[facility]  09/07/2013 1:48 PM Jeremy ButtnerJames L Thomas  MRN:  [id number]  Subjective:   I don't think Lexapro is working.  It is making me nightly dreams.  I cannot sleep.    Objective Patient seen chart reviewed.  Patient remains very sad depressed and continued to endorse suicidal thoughts.  He endorsed poor sleep and having nightly dreams which he believed because of Lexapro.  He is also complaining of back pain and [facility].  He is very worried about his back .  Patient appears very depressed and admitted sometime tearfulness and hopefulness.  He have regrets about his drug use and he ruminate about it.  He denies any hallucination.  He is going to the groups but he has difficulties staying because of chronic back pain.  He denies any tremors or shakes.  He is concerned about his living situation.    Diagnosis:   Substance/Addictive Disorders:  Cannabis Use Disorder - Mild (305.20) Depressive Disorders:  Major Depressive Disorder - Moderate (296.22) Total Time spent with patient: 20 minutes  Axis I: Major Depression, Recurrent severe and Substance Abuse Axis II: Deferred Axis III:  Past Medical History  Diagnosis Date  . DVT (deep venous thrombosis) 01/08/2011    lt leg  . DVT of lower limb, acute 01/12/2011  . BPH (benign prostatic hyperplasia)   . Current use of long term anticoagulation   . 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 Noncompliance w/medication treatment due to intermit use of medication   . History of tobacco use     QUIT early 2013  . Hypertension   . Depression   . Enlarged prostate    Axis IV: economic problems, housing problems, other psychosocial or environmental problems, problems related to social environment and problems with primary support group  ADL's:  Intact  Sleep: Poor  Appetite:  Fair  Psychiatric Specialty Exam: Physical Exam  ROS  Blood pressure  110/72, pulse 70, temperature 97.9 F (36.6 C), temperature source Oral, resp. rate 18, height 5\' 8"  (1.727 m), weight 173 lb (78.472 kg), SpO2 99.00%.Body mass index is 26.31 kg/(m^2).  General Appearance: Disheveled and Fairly Groomed  Patent attorneyye Contact::  Fair  Speech:  Slow  Volume:  Decreased  Mood:  Anxious, Depressed and Dysphoric  Affect:  Constricted, Depressed and Restricted  Thought Process:  Logical  Orientation:  Full (Time, Place, and Person)  Thought Content:  Rumination  Suicidal Thoughts:  Yes.  without intent/plan  Homicidal Thoughts:  No  Memory:  Immediate;   Fair Recent;   Fair Remote;   Fair  Judgement:  Impaired  Insight:  Lacking  Psychomotor Activity:  Decreased  Concentration:  Fair  Recall:  FiservFair  Fund of Knowledge:Fair  Language: Fair  Akathisia:  No  Handed:  Right  AIMS (if indicated):     Assets:  Communication Skills Desire for Improvement  Sleep:  Number of Hours: 5.75   Musculoskeletal: Strength & Muscle Tone: within normal limits Gait & Station: normal Patient leans: N/A  Current Medications: Current Facility-Administered Medications  Medication Dose Route Frequency Provider Last Rate Last Dose  . acetaminophen (TYLENOL) tablet 650 mg  650 mg Oral Q6H PRN Earney NavyJosephine C Onuoha, NP   650 mg at 09/06/13 0754  . alum & mag hydroxide-simeth (MAALOX/MYLANTA) 200-200-20 MG/5ML suspension 30 mL  30 mL Oral Q4H PRN Earney NavyJosephine C Onuoha, NP      .  cyclobenzaprine (FLEXERIL) tablet 10 mg  10 mg Oral TID PRN Earney NavyJosephine C Onuoha, NP   10 mg at 09/07/13 0829  . DULoxetine (CYMBALTA) DR capsule 30 mg  30 mg Oral Daily Cleotis NipperSyed T Keimari , MD   30 mg at 09/07/13 1144  . magnesium hydroxide (MILK OF MAGNESIA) suspension 30 mL  30 mL Oral Daily PRN Earney NavyJosephine C Onuoha, NP      . rivaroxaban (XARELTO) tablet 20 mg  20 mg Oral Q supper Earney NavyJosephine C Onuoha, NP   20 mg at 09/06/13 1645  . tamsulosin (FLOMAX) capsule 0.4 mg  0.4 mg Oral QHS Earney NavyJosephine C Onuoha, NP   0.4 mg at  09/05/13 2144    Lab Results: No results found for this or any previous visit (from the past 48 hour(s)).  Physical Findings: AIMS:  , ,  ,  ,    CIWA:  CIWA-Ar Total: 3 COWS:  COWS Total Score: 2  Treatment Plan Summary: Daily contact with patient to assess and evaluate symptoms and progress in treatment Medication management .  I will discontinue Lexapro because patient believes is caused nightmares.  He would start Cymbalta which helps his anxiety and also some advantage to reduce pain.  Patient is taking Flexeril .  We will continue that.  Encouraged to participate in group milieu therapy.  Discussed psychosocial stressors .    Medical Decision Making Problem Points:  Established problem, worsening (2), Review of last therapy session (1) and Review of psycho-social stressors (1) Data Points:  Review and summation of old records (2) Review of medication regiment & side effects (2) Review of new medications or change in dosage (2)  I certify that inpatient services furnished can reasonably be expected to improve the patient's condition.   Hawkins Seaman T. 09/07/2013, 1:48 PM

## 2013-09-07 NOTE — Progress Notes (Signed)
[facility] Group Notes:  (Nursing/MHT/Case Management/Adjunct)  Date:  09/07/2013  Time:  1:59 AM  Type of Therapy:  Group Therapy  Participation Level:  Active  Participation Quality:  Appropriate  Affect:  Appropriate  Cognitive:  Alert  Insight:  Good  Engagement in Group:  Engaged  Modes of Intervention:  Discussion  Summary of Progress/Problems: Patient stated that his day started off good. Patient says that he began to not feel well after his medication dosage was raised. Overall he has had a good day.  Percell LocusJOHNSON,TAWANA 09/07/2013, 1:59 AM

## 2013-09-07 NOTE — Progress Notes (Signed)
D: Patient denies SI/HI and A/V hallucinations; patient reports sleep is poor and reports more dreams and reports being uncomfortable; reports appetite is good ; reports energy level is low ; reports ability to pay attention is improving; rates depression as 5/10; rates hopelessness 4/10; patient reporting back pain  A: Monitored q 15 minutes; patient encouraged to attend groups; patient educated about medications; patient given medications per physician orders; patient encouraged to express feelings and/or concerns  R: Patient is flat and sad; patient is cooperative; patient's interaction with staff and peers is appropriate; patient was able to set goal to talk with staff 1:1 when having feelings of SI; patient is taking medications as prescribed and tolerating medications; patient is attending some groups

## 2013-09-07 NOTE — [facility] Group Notes (Signed)
[facility]  Emotional Regulation 1:15 - 2: 30 PM        09/07/2013  3:07 PM'   Type of Therapy:  Group Therapy  Participation Level: Did not attend group.   Wynn BankerHodnett, Gelsey Amyx Hairston 09/07/2013 3:07 PM

## 2013-09-07 NOTE — Progress Notes (Signed)
Adult Psychoeducational Group Note  Date:  09/07/2013 Time:  10:22 PM  Group Topic/Focus:  Wrap-Up Group:   The focus of this group is to help patients review their daily goal of treatment and discuss progress on daily workbooks.  Participation Level:  Active  Participation Quality:  Appropriate  Affect:  Appropriate  Cognitive:  Appropriate  Insight: Appropriate  Engagement in Group:  Engaged  Modes of Intervention:  Discussion  Additional Comments: The patient expressed that he learned in group to not dwell on past.The patient expressed he needs to make better decisions.  Octavio Mannshigpen, Janson Lamar Lee 09/07/2013, 10:22 PM

## 2013-09-07 NOTE — [facility] Group Notes (Signed)
[facility] LCSW Aftercare Discharge Planning Group Note   09/07/2013 12:56 PM    Participation Quality:  Appropraite  Mood/Affect:  Appropriate  Depression Rating:  6  Anxiety Rating: 1  Thoughts of Suicide:  No  Will you contract for safety?   NA  Current AVH:  No  Plan for Discharge/Comments:  Patient attended discharge planning group and actively participated in group.  [facility]  CSW advised patient previous CSW has made a referral.   CSW provided all participants with daily workbook.   Transportation Means: Patient has transportation.   Supports:  Patient has a support system.   Khrystyna Schwalm, Joesph JulyQuylle Hairston

## 2013-09-07 NOTE — [facility] Group Notes (Signed)
Summary of Progress/Problems: [facility] Group Notes: (Nursing/MHT/Case Management/Adjunct)  Date: 09/07/2013  Time: 1:11 PM  Type of Therapy: Psychoeducational Skills  Participation Level: Active  Participation Quality: Appropriate  Affect: Appropriate  Cognitive: Appropriate  Insight: Appropriate  Engagement in Group: Engaged  Modes of Intervention: Discussion  Summary of Progress/Problems: Pt. attended group and participated in group activity. Pt. answered questions about personal development and identified coping skills and support system.         Tylene FantasiaRichardson, Renesha Lizama N 09/07/2013, 1:10 PM

## 2013-09-08 DIAGNOSIS — F3289 Other specified depressive episodes: Secondary | ICD-10-CM

## 2013-09-08 DIAGNOSIS — F329 Major depressive disorder, single episode, unspecified: Secondary | ICD-10-CM

## 2013-09-08 MED ORDER — RIVAROXABAN 20 MG PO TABS: 20.0000 mg | ORAL_TABLET | ORAL | Status: DC

## 2013-09-08 MED ORDER — TAMSULOSIN HCL 0.4 MG PO CAPS: 0.4000 mg | ORAL_CAPSULE | Freq: Every day | ORAL | Status: AC

## 2013-09-08 MED ORDER — VITAMIN D 1000 UNITS PO TABS: 2000.0000 [IU] | ORAL_TABLET | Freq: Every day | ORAL | Status: DC

## 2013-09-08 MED ORDER — TAMSULOSIN HCL 0.4 MG PO CAPS: 0.4000 mg | ORAL_CAPSULE | Freq: Every day | ORAL | Status: DC

## 2013-09-08 MED ORDER — VITAMIN D3 25 MCG (1000 UNIT) PO TABS
2000.0000 [IU] | ORAL_TABLET | Freq: Every day | ORAL | Status: DC
  Filled 2013-09-08: qty 2

## 2013-09-08 MED ORDER — RIVAROXABAN 20 MG PO TABS: 20.0000 mg | ORAL_TABLET | Freq: Every day | ORAL | Status: AC

## 2013-09-08 MED ORDER — CYCLOBENZAPRINE HCL 10 MG PO TABS: 10.0000 mg | ORAL_TABLET | Freq: Three times a day (TID) | ORAL | Status: DC | PRN

## 2013-09-08 MED ORDER — ADULT MULTIVITAMIN W/MINERALS CH
1.0000 | ORAL_TABLET | Freq: Every day | ORAL | Status: DC
  Filled 2013-09-08: qty 1

## 2013-09-08 MED ORDER — DULOXETINE HCL 30 MG PO CPEP: 30.0000 mg | ORAL_CAPSULE | Freq: Every day | ORAL | Status: DC

## 2013-09-08 MED ORDER — MULTI-VITAMIN/MINERALS PO TABS: 1.0000 | ORAL_TABLET | Freq: Every day | ORAL | Status: AC

## 2013-09-08 NOTE — Progress Notes (Signed)
D/C instructions/meds/follow-up appointments reviewed, pt verbalized understanding, pt's belongings returned to pt, samples given. 

## 2013-09-08 NOTE — [facility] Suicide Risk Assessment (Signed)
Demographic Factors:  Male and separated, currently homeless  Total Time spent with patient: 20 minutes  Psychiatric Specialty Exam: Physical Exam  ROS  Blood pressure 123/72, pulse 89, temperature 97.5 F (36.4 C), temperature source Oral, resp. rate 18, height 5\' 8"  (1.727 m), weight 78.472 kg (173 lb), SpO2 99.00%.Body mass index is 26.31 kg/(m^2).  General Appearance: Well Groomed  Patent attorneyye Contact::  Good  Speech:  Normal Rate  Volume:  Normal  Mood:  Euthymic  Affect:  Appropriate and Congruent  Thought Process:  Linear  Orientation:  Full (Time, Place, and Person)  Thought Content:  denies any hallucinations and no delusions  Suicidal Thoughts:  No  Homicidal Thoughts:  No  Memory:  NA  Judgement:  Good  Insight:  Fair  Psychomotor Activity:  Normal  Concentration:  Good  Recall:  Good  Fund of Knowledge:Good  Language: Good  Akathisia:  Negative  Handed:  Right  AIMS (if indicated):     Assets:  Communication Skills Desire for Improvement Resilience  Sleep:  Number of Hours: 6.25    Musculoskeletal: Strength & Muscle Tone: within normal limits Gait & Station: normal Patient leans: N/A   Mental Status Per Nursing Assessment::   On Admission:     Current Mental Status by Physician: At this time patient calm, pleasant, well related,  not suicidal or homicidal or psychotic, future oriented  Loss Factors: homelessness, limited support network   Historical Factors: Impulsivity and history of substance abuse   Risk Reduction Factors:   Sense of responsibility to family and Positive coping skills or problem solving skills  Continued Clinical Symptoms:  Improved symptoms upon discharge, denies ongoing depressive symptoms at time of discharge  Cognitive Features That Contribute To Risk:  No gross cognitive deficits noted at discharge   Suicide Risk:  Mild:  Suicidal ideation of limited frequency, intensity, duration, and specificity.  There are no  identifiable plans, no associated intent, mild dysphoria and related symptoms, good self-control (both objective and subjective assessment), few other risk factors, and identifiable protective factors, including available and accessible social support.  Discharge Diagnoses:   AXIS I:  Substance Induced Mood Disorder, Cocaine Dependence AXIS II:  Deferred AXIS III:   Past Medical History  Diagnosis Date  . DVT (deep venous thrombosis) 01/08/2011    lt leg  . DVT of lower limb, acute 01/12/2011  . BPH (benign prostatic hyperplasia)   . Current use of long term anticoagulation   . 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 Noncompliance w/medication treatment due to intermit use of medication   . History of tobacco use     QUIT early 2013  . Hypertension   . Depression   . Enlarged prostate    AXIS IV:  economic problems, housing problems and problems with primary support group AXIS V:  51-60 moderate symptoms  Plan Of Care/Follow-up recommendations:  Activity:  As tolerated  Diet:  Heart healthy, low sodium Tests:  NA Other:  See below  Is patient on multiple antipsychotic therapies at discharge:  No   Has Patient had three or more failed trials of antipsychotic monotherapy by history:  No  Recommended Plan for Multiple Antipsychotic Therapies: NA  Referred [facility] program.  [facility]Solomon[facility] , whom he plans to follow up with. NA attendance and use of sponsorship recommended Patient leaving in  good spirits. COBOS, FERNANDO 09/08/2013, 9:21 AM

## 2013-09-08 NOTE — Discharge Summary (Signed)
Physician Discharge Summary Note  Patient:  Jeremy ButtnerJames L Mengel is an 48 y.o., male MRN:  [id number] DOB:  Aug 20, 1965 Patient phone:  [phone] (home)  Patient address:   [address] [place] [address],  Total Time spent with patient: 20 minutes  Date of Admission:  09/03/2013 Date of Discharge: 09/08/2013  Reason for Admission:  Depression; feeling hopeless and worthless  Discharge Diagnoses: Active Problems:   Major depressive disorder, recurrent severe without psychotic features   Cocaine dependence   Psychiatric Specialty Exam: Physical Exam  ROS  Blood pressure 123/72, pulse 89, temperature 97.5 F (36.4 C), temperature source Oral, resp. rate 18, height 5\' 8"  (1.727 m), weight 78.472 kg (173 lb), SpO2 99.00%.Body mass index is 26.31 kg/(m^2).  General Appearance: Negative  Eye Contact::  Good  Speech:  Clear and Coherent and Normal Rate  Volume:  Normal  Mood:  Negative  Affect:  Appropriate  Thought Process:  Goal Directed and Intact  Orientation:  Full (Time, Place, and Person)  Thought Content:  WDL  Suicidal Thoughts:  No  Homicidal Thoughts:  No  Memory:  Immediate;   Good Recent;   Good  Judgement:  Good  Insight:  Good  Psychomotor Activity:  Normal  Concentration:  Negative  Recall:  Negative  Fund of Knowledge:Good  Language: Good  Akathisia:  Negative  Handed:    AIMS (if indicated):     Assets:  Communication Skills Desire for Improvement Physical Health  Sleep:  Number of Hours: 6.25    Past Psychiatric History: Diagnosis:  Hospitalizations:  Outpatient Care:  Substance Abuse Care:  Self-Mutilation:  Suicidal Attempts:  Violent Behaviors:   Musculoskeletal: Strength & Muscle Tone: within normal limits Gait & Station: normal Patient leans: N/A  DSM5:  Depressive Disorders:  Major Depressive Disorder (296.99)  Axis Diagnosis:   AXIS I:  Depressive Disorder NOS and Substance Abuse AXIS II:  Deferred AXIS III:   Past  Medical History  Diagnosis Date  . DVT (deep venous thrombosis) 01/08/2011    lt leg  . DVT of lower limb, acute 01/12/2011  . BPH (benign prostatic hyperplasia)   . Current use of long term anticoagulation   . 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 Noncompliance w/medication treatment due to intermit use of medication   . History of tobacco use     QUIT early 2013  . Hypertension   . Depression   . Enlarged prostate    AXIS IV:  economic problems, other psychosocial or environmental problems and problems related to social environment AXIS V:  61-70 mild symptoms  Level of Care:  OP- [facility] to pick patient up this afternoon   Hospital Course:  5 days   Consults:  psychiatry and and PCP  Significant Diagnostic Studies:  None  Discharge Vitals:   Blood pressure 123/72, pulse 89, temperature 97.5 F (36.4 C), temperature source Oral, resp. rate 18, height 5\' 8"  (1.727 m), weight 78.472 kg (173 lb), SpO2 99.00%. Body mass index is 26.31 kg/(m^2). Lab Results:   No results found for this or any previous visit (from the past 72 hour(s)).  Physical Findings: AIMS:  , ,  ,  ,    CIWA:  CIWA-Ar Total: 3 COWS:  COWS Total Score: 2  Psychiatric Specialty Exam: See Psychiatric Specialty Exam and Suicide Risk Assessment completed by Attending Physician prior to discharge.  Discharge destination:  [facility]  Is patient on multiple antipsychotic therapies  at discharge:  No   Has Patient had three or more failed trials of antipsychotic monotherapy by history:  No  Recommended Plan for Multiple Antipsychotic Therapies: NA     Medication List    STOP taking these medications       acetaminophen 650 MG CR tablet  Commonly known as:  TYLENOL     acetaminophen-codeine 300-30 MG per tablet  Commonly known as:  TYLENOL #3      TAKE these medications     Indication   cholecalciferol 1000 UNITS tablet  Commonly known as:  VITAMIN D   Take 2 tablets (2,000 Units total) by mouth daily.      cyclobenzaprine 10 MG tablet  Commonly known as:  FLEXERIL  Take 1 tablet (10 mg total) by mouth 3 (three) times daily as needed for muscle spasms.   Indication:  Muscle Spasm     DULoxetine 30 MG capsule  Commonly known as:  CYMBALTA  Take 1 capsule (30 mg total) by mouth daily.      multivitamin with minerals tablet  Take 1 tablet by mouth daily.      rivaroxaban 20 MG Tabs tablet  Commonly known as:  XARELTO  Take 1 tablet (20 mg total) by mouth daily with supper.   Indication:  Blood Clot in a Deep Vein     tamsulosin 0.4 MG Caps capsule  Commonly known as:  FLOMAX  Take 1 capsule (0.4 mg total) by mouth at bedtime.   Indication:  Enlarged Prostate with Urination Problems           Follow-up Information   Follow up with [facility]. (referral made on 09/05/13)    Contact information:   [address]  [place], [place] [address] Phone: [phone] Fax: [phone]      [facility] (Call to schedule nurse follow up phone call after discharge.  They will than schedule your follow up appointment with Dr. Carmie KannerSalmon than. )    Contact information:   [address]  [place] [address] Phone: [phone] Fax: [phone]      Follow up with [facility] On 09/08/2013. (Thursday, September 08, 2013 at 2:00 PM)    Contact information:   [address] [place], [place]   [address]  [phone]      Follow-up recommendations:  Activity:  as tolerated Diet:  regular  Comments:  Patient denies SI, HI, or AV hallucinations.  He is pleasant and cooperative.  Mood= appropriate.        Total Discharge Time:  Less than 30 minutes.  SignedKizzie Fantasia: BURKETT, EVANNA CORI 09/08/2013, 11:51 AM Patient seen, Suicide Assessment Completed.  Disposition Plan Reviewed

## 2013-09-13 NOTE — Progress Notes (Signed)
Patient Discharge Instructions:  After Visit Summary (AVS):   Faxed to:  09/13/13 Discharge Summary Note:   Faxed to:  09/13/13 Psychiatric Admission Assessment Note:   Faxed to:  09/13/13 Suicide Risk Assessment - Discharge Assessment:   Faxed to:  09/13/13 Faxed/Sent to the Next Level Care provider:  09/13/13 [facility] @ [phone] Faxed to [facility] @ [phone]  Jerelene ReddenSheena E [place], 09/13/2013, 2:05 PM

## 2013-10-06 ENCOUNTER — Emergency Department (HOSPITAL_BASED_OUTPATIENT_CLINIC_OR_DEPARTMENT_OTHER)
Admission: EM | Admit: 2013-10-06 | Discharge: 2013-10-06 | Disposition: A | Discharge status: Home / Self Care | Payer: Non-veteran care | Attending: Emergency Medicine | Admitting: Emergency Medicine

## 2013-10-06 ENCOUNTER — Encounter (HOSPITAL_BASED_OUTPATIENT_CLINIC_OR_DEPARTMENT_OTHER): Payer: Self-pay | Admitting: Emergency Medicine

## 2013-10-06 ENCOUNTER — Emergency Department (HOSPITAL_BASED_OUTPATIENT_CLINIC_OR_DEPARTMENT_OTHER): Payer: Non-veteran care

## 2013-10-06 DIAGNOSIS — Z9119 Patient's noncompliance with other medical treatment and regimen: Secondary | ICD-10-CM | POA: Insufficient documentation

## 2013-10-06 DIAGNOSIS — Z79899 Other long term (current) drug therapy: Secondary | ICD-10-CM | POA: Insufficient documentation

## 2013-10-06 DIAGNOSIS — Z87891 Personal history of nicotine dependence: Secondary | ICD-10-CM | POA: Insufficient documentation

## 2013-10-06 DIAGNOSIS — R51 Headache: Secondary | ICD-10-CM | POA: Insufficient documentation

## 2013-10-06 DIAGNOSIS — J0130 Acute sphenoidal sinusitis, unspecified: Secondary | ICD-10-CM

## 2013-10-06 DIAGNOSIS — Z86711 Personal history of pulmonary embolism: Secondary | ICD-10-CM | POA: Insufficient documentation

## 2013-10-06 DIAGNOSIS — Z91199 Patient's noncompliance with other medical treatment and regimen due to unspecified reason: Secondary | ICD-10-CM | POA: Insufficient documentation

## 2013-10-06 DIAGNOSIS — Z9089 Acquired absence of other organs: Secondary | ICD-10-CM | POA: Insufficient documentation

## 2013-10-06 DIAGNOSIS — I10 Essential (primary) hypertension: Secondary | ICD-10-CM | POA: Insufficient documentation

## 2013-10-06 DIAGNOSIS — F3289 Other specified depressive episodes: Secondary | ICD-10-CM | POA: Insufficient documentation

## 2013-10-06 DIAGNOSIS — Z86718 Personal history of other venous thrombosis and embolism: Secondary | ICD-10-CM | POA: Insufficient documentation

## 2013-10-06 DIAGNOSIS — N400 Enlarged prostate without lower urinary tract symptoms: Secondary | ICD-10-CM | POA: Insufficient documentation

## 2013-10-06 DIAGNOSIS — Z7901 Long term (current) use of anticoagulants: Secondary | ICD-10-CM | POA: Insufficient documentation

## 2013-10-06 DIAGNOSIS — F329 Major depressive disorder, single episode, unspecified: Secondary | ICD-10-CM | POA: Insufficient documentation

## 2013-10-06 MED ORDER — AMOXICILLIN-POT CLAVULANATE 875-125 MG PO TABS: 1.0000 | ORAL_TABLET | Freq: Two times a day (BID) | ORAL | Status: AC

## 2013-10-06 MED ORDER — AMOXICILLIN-POT CLAVULANATE 875-125 MG PO TABS
1.0000 | ORAL_TABLET | Freq: Once | ORAL | Status: AC
  Administered 2013-10-06: 1 via ORAL
  Filled 2013-10-06: qty 1

## 2013-10-06 NOTE — Discharge Instructions (Signed)

## 2013-10-06 NOTE — ED Notes (Signed)
C/o ha x 2 days had 1 gm tylenol yesterday and 1 today  Took bendryal last pm,  States feels like something crawling in rt ear off and on x several months,  States [facility] has checked bp last 2 days and it was high

## 2013-10-06 NOTE — ED Provider Notes (Signed)
CSN: [id number]     Arrival date & time 10/06/13  1941 History  This chart was scribed for Jeremy Quarryanielle S Braxston Quinter, MD by Chestine SporeSoijett Blue, ED Scribe. The patient was seen in room MH07/MH07 at 8:38 PM.     Chief Complaint  Patient presents with  . Headache    Patient is a 48 y.o. male presenting with headaches. The history is provided by the patient. No language interpreter was used.  Headache Pain location:  Frontal Quality: throbbing. Radiates to:  Does not radiate Severity currently:  7/10 Severity at highest:  7/10 Duration:  2 days Timing:  Constant Progression:  Unchanged Chronicity:  New Relieved by:  Nothing Worsened by:  Nothing tried Ineffective treatments:  Acetaminophen Associated symptoms: no fever    HPI Comments: Gilman ButtnerJames L Burdette is a 48 y.o. male who presents to the Emergency Department complaining of a HA onset 2 days ago. He states that the HA is located in the front of his head and on the sides. He states that it is a throbbing ache.  He denies any injury to his head. He states that his HA is a 7/10. He states that he was [facility] with no relief for his symptoms.   He denies visual disturbance, fevers, chills, and any other associated symptoms. [facility]  He states that [facility] and it was up and that it was 179/100. He states that he does not take anything for his blood pressure and he was taken off the medication earlier this year. [facility]. He states that he has been there for 2 weeks. He states that he has been through detox. He states that he can not take any IBU because of his blood thinner medication.      Past Medical History  Diagnosis Date  . DVT (deep venous thrombosis) 01/08/2011    lt leg  . DVT of lower limb, acute 01/12/2011  . BPH (benign prostatic hyperplasia)   . Current use of long term anticoagulation   . 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 Noncompliance w/medication treatment due to intermit use of medication   . History of tobacco use     QUIT early 2013  . Hypertension   . Depression   . Enlarged prostate    Past Surgical History  Procedure Laterality Date  . Tonsillectomy     No family history on file. History  Substance Use Topics  . Smoking status: Former Smoker    Types: Cigars    Quit date: 09/14/2010  . Smokeless tobacco: Never Used  . Alcohol Use: 1.2 oz/week    2 Cans of beer per week     Comment: no alcohol since 03/2012    Review of Systems  Constitutional: Negative for fever and chills.  Eyes: Negative for visual disturbance.  Neurological: Positive for headaches.  All other systems reviewed and are negative.     Allergies  Review of patient's allergies indicates no known allergies.  Home Medications   Prior to Admission medications   Medication Sig Start Date End Date Taking? Authorizing Provider  cholecalciferol (VITAMIN D) 1000 UNITS tablet Take 2 tablets (2,000 Units total) by mouth daily. 09/08/13   Evanna Janann Augustori Burkett, NP  cyclobenzaprine (FLEXERIL) 10 MG tablet Take 1 tablet (10 mg total) by mouth  3 (three) times daily as needed for muscle spasms. 09/08/13   Evanna Janann August, NP  DULoxetine (CYMBALTA) 30 MG capsule Take 1 capsule (30 mg total) by mouth daily. 09/08/13   Audrea Muscat, NP  Multiple Vitamins-Minerals (MULTIVITAMIN WITH MINERALS) tablet Take 1 tablet by mouth daily. 09/08/13   Evanna Janann August, NP  rivaroxaban (XARELTO) 20 MG TABS tablet Take 1 tablet (20 mg total) by mouth daily with supper. 09/08/13   Evanna Janann August, NP  rivaroxaban (XARELTO) 20 MG TABS tablet Take 1 tablet (20 mg total) by mouth every morning. 09/08/13   Evanna Janann August, NP  tamsulosin (FLOMAX) 0.4 MG CAPS capsule Take 1 capsule (0.4 mg total) by mouth at bedtime. 09/08/13   Evanna Janann August, NP  tamsulosin (FLOMAX)  0.4 MG CAPS capsule Take 1 capsule (0.4 mg total) by mouth at bedtime. 09/08/13   Evanna Janann August, NP   BP 153/91  Pulse 87  Temp(Src) 99 F (37.2 C) (Oral)  Resp 18  Ht 5\' 8"  (1.727 m)  Wt 173 lb (78.472 kg)  BMI 26.31 kg/m2  SpO2 97%  Physical Exam  Nursing note and vitals reviewed. Constitutional: He is oriented to person, place, and time. He appears well-developed and well-nourished.  HENT:  Head: Normocephalic and atraumatic.  Right Ear: Tympanic membrane and external ear normal.  Left Ear: Tympanic membrane and external ear normal.  Nose: Nose normal. Right sinus exhibits no maxillary sinus tenderness and no frontal sinus tenderness. Left sinus exhibits no maxillary sinus tenderness and no frontal sinus tenderness.  Eyes: Conjunctivae and EOM are normal. Pupils are equal, round, and reactive to light. Right eye exhibits no nystagmus. Left eye exhibits no nystagmus.  Neck: Normal range of motion. Neck supple.  Cardiovascular: Normal rate, regular rhythm, normal heart sounds and intact distal pulses.   Pulmonary/Chest: Effort normal and breath sounds normal. No respiratory distress. He exhibits no tenderness.  Abdominal: Soft. Bowel sounds are normal. He exhibits no distension and no mass. There is no tenderness.  Musculoskeletal: Normal range of motion. He exhibits no edema and no tenderness.  Neurological: He is alert and oriented to person, place, and time. He has normal strength and normal reflexes. No sensory deficit. He displays a negative Romberg sign. GCS eye subscore is 4. GCS verbal subscore is 5. GCS motor subscore is 6.  Reflex Scores:      Tricep reflexes are 2+ on the right side and 2+ on the left side.      Bicep reflexes are 2+ on the right side and 2+ on the left side.      Brachioradialis reflexes are 2+ on the right side and 2+ on the left side.      Patellar reflexes are 2+ on the right side and 2+ on the left side.      Achilles reflexes are 2+ on the right  side and 2+ on the left side. Patient with normal gait without ataxia, shuffling, spasm, or antalgia. Speech is normal without dysarthria, dysphasia, or aphasia. Muscle strength is 5/5 in bilateral shoulders, elbow flexor and extensors, wrist flexor and extensors, and intrinsic hand muscles. 5/5 bilateral lower extremity hip flexors, extensors, knee flexors and extensors, and ankle dorsi and plantar flexors.    Skin: Skin is warm and dry. No rash noted.  Psychiatric: He has a normal mood and affect. His behavior is normal. Judgment and thought content normal.    ED Course  Procedures (including critical care time) DIAGNOSTIC STUDIES:  Oxygen Saturation is 97% on room air, normal by my interpretation.    COORDINATION OF CARE: 8:46 PM-Discussed treatment plan which includes CT scan of the head with pt at bedside and pt agreed to plan.   Labs Review Labs Reviewed - No data to display  Imaging Review Ct Head Wo Contrast  10/06/2013   CLINICAL DATA:  Headache on anticoagulant. Pain in the frontal, right side, and posterior head. Elevated blood pressure.  EXAM: CT HEAD WITHOUT CONTRAST  TECHNIQUE: Contiguous axial images were obtained from the base of the skull through the vertex without intravenous contrast.  COMPARISON:  06/21/2013  FINDINGS: There is no intra or extra-axial fluid collection or mass lesion. The basilar cisterns and ventricles have a normal appearance. There is no CT evidence for acute infarction or hemorrhage. Bone windows show air-fluid level within the left sphenoid air cell consistent with acute sinusitis. No calvarial fracture.  IMPRESSION: 1.  No evidence for acute intracranial abnormality. 2. Left sphenoid sinusitis.   Electronically Signed   By: Rosalie Gums M.D.   On: 10/06/2013 21:11     EKG Interpretation None      MDM   Final diagnoses:  Acute sphenoidal sinusitis, recurrence not specified  Essential hypertension    Patient presents with headache across  temples on anticoagulation.  CT without evidence of bleed but posiitve for sphenoid sinusitis.  Patient with history of hypertension.  Patient advised to have op follow up.   I personally performed the services described in this documentation, which was scribed in my presence. The recorded information has been reviewed and is accurate.    Jeremy Quarry, MD 10/06/13 2204

## 2013-10-06 NOTE — ED Notes (Signed)
Head[facility] x 2 days[facility]

## 2013-10-17 ENCOUNTER — Emergency Department (HOSPITAL_BASED_OUTPATIENT_CLINIC_OR_DEPARTMENT_OTHER)
Admission: EM | Admit: 2013-10-17 | Discharge: 2013-10-17 | Disposition: A | Discharge status: Home / Self Care | Payer: Non-veteran care | Attending: Emergency Medicine | Admitting: Emergency Medicine

## 2013-10-17 DIAGNOSIS — R51 Headache: Secondary | ICD-10-CM | POA: Insufficient documentation

## 2013-10-17 DIAGNOSIS — Z87891 Personal history of nicotine dependence: Secondary | ICD-10-CM | POA: Insufficient documentation

## 2013-10-17 DIAGNOSIS — Z91199 Patient's noncompliance with other medical treatment and regimen due to unspecified reason: Secondary | ICD-10-CM | POA: Insufficient documentation

## 2013-10-17 DIAGNOSIS — Z86718 Personal history of other venous thrombosis and embolism: Secondary | ICD-10-CM | POA: Insufficient documentation

## 2013-10-17 DIAGNOSIS — I10 Essential (primary) hypertension: Secondary | ICD-10-CM | POA: Insufficient documentation

## 2013-10-17 DIAGNOSIS — J0110 Acute frontal sinusitis, unspecified: Secondary | ICD-10-CM | POA: Insufficient documentation

## 2013-10-17 DIAGNOSIS — Z9119 Patient's noncompliance with other medical treatment and regimen: Secondary | ICD-10-CM | POA: Insufficient documentation

## 2013-10-17 DIAGNOSIS — N400 Enlarged prostate without lower urinary tract symptoms: Secondary | ICD-10-CM | POA: Insufficient documentation

## 2013-10-17 DIAGNOSIS — J0111 Acute recurrent frontal sinusitis: Secondary | ICD-10-CM

## 2013-10-17 DIAGNOSIS — F3289 Other specified depressive episodes: Secondary | ICD-10-CM | POA: Insufficient documentation

## 2013-10-17 DIAGNOSIS — Z86711 Personal history of pulmonary embolism: Secondary | ICD-10-CM | POA: Insufficient documentation

## 2013-10-17 DIAGNOSIS — F329 Major depressive disorder, single episode, unspecified: Secondary | ICD-10-CM | POA: Insufficient documentation

## 2013-10-17 DIAGNOSIS — Z79899 Other long term (current) drug therapy: Secondary | ICD-10-CM | POA: Insufficient documentation

## 2013-10-17 MED ORDER — LEVOFLOXACIN 500 MG PO TABS: 500.0000 mg | ORAL_TABLET | Freq: Every day | ORAL | Status: DC

## 2013-10-17 NOTE — ED Provider Notes (Signed)
Medical screening examination/treatment/procedure(s) were performed by non-physician practitioner and as supervising physician I was immediately available for consultation/collaboration.   EKG Interpretation None        Jeremy ChurnJohn David Iyonnah Ferrante III, MD 10/17/13 1745

## 2013-10-17 NOTE — ED Provider Notes (Signed)
CSN: [id number]     Arrival date & time 10/17/13  1414 History   First MD Initiated Contact with Patient 10/17/13 1609     Chief Complaint  Patient presents with  . Headache     (Consider location/radiation/quality/duration/timing/severity/associated sxs/prior Treatment) Patient is a 48 y.o. male presenting with headaches. The history is provided by the patient. No language interpreter was used.  Headache Pain location:  Frontal Quality:  Sharp Radiates to:  Face Severity currently:  10/10 Severity at highest:  10/10 Onset quality:  Gradual Duration:  2 weeks Timing:  Constant Progression:  Worsening Chronicity:  Chronic Similar to prior headaches: yes   Context: not activity   Relieved by:  Nothing Worsened by:  Nothing tried Ineffective treatments:  None tried Associated symptoms: sinus pressure   Associated symptoms: no sore throat and no vomiting   Risk factors: no anger and does not have insomnia   Pt reports he was treated here.   He is on day 9 of augmentin.  Pt reports he felt better for 2 days but now green congestion and increased pain  Past Medical History  Diagnosis Date  . DVT (deep venous thrombosis) 01/08/2011    lt leg  . DVT of lower limb, acute 01/12/2011  . BPH (benign prostatic hyperplasia)   . Current use of long term anticoagulation   . 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 Noncompliance w/medication treatment due to intermit use of medication   . History of tobacco use     QUIT early 2013  . Hypertension   . Depression   . Enlarged prostate    Past Surgical History  Procedure Laterality Date  . Tonsillectomy     No family history on file. History  Substance Use Topics  . Smoking status: Former Smoker    Types: Cigars    Quit date: 09/14/2010  . Smokeless tobacco: Never Used  . Alcohol Use: 1.2 oz/week    2 Cans of beer per week     Comment: no alcohol since 03/2012    Review of  Systems  HENT: Positive for sinus pressure. Negative for facial swelling and sore throat.   Gastrointestinal: Negative for vomiting.  Neurological: Positive for headaches.  All other systems reviewed and are negative.     Allergies  Review of patient's allergies indicates no known allergies.  Home Medications   Prior to Admission medications   Medication Sig Start Date End Date Taking? Authorizing Provider  amoxicillin-clavulanate (AUGMENTIN) 875-125 MG per tablet Take 1 tablet by mouth 2 (two) times daily.   Yes Historical Provider, MD  cholecalciferol (VITAMIN D) 1000 UNITS tablet Take 2 tablets (2,000 Units total) by mouth daily. 09/08/13   Evanna Janann August, NP  cyclobenzaprine (FLEXERIL) 10 MG tablet Take 1 tablet (10 mg total) by mouth 3 (three) times daily as needed for muscle spasms. 09/08/13   Evanna Janann August, NP  DULoxetine (CYMBALTA) 30 MG capsule Take 1 capsule (30 mg total) by mouth daily. 09/08/13   Audrea Muscat, NP  Multiple Vitamins-Minerals (MULTIVITAMIN WITH MINERALS) tablet Take 1 tablet by mouth daily. 09/08/13   Evanna Janann August, NP  rivaroxaban (XARELTO) 20 MG TABS tablet Take 1 tablet (20 mg total) by mouth daily with supper. 09/08/13   Evanna Janann August, NP  rivaroxaban (XARELTO) 20 MG TABS tablet Take 1 tablet (20 mg total) by mouth every morning. 09/08/13   Evanna Janann August, NP  tamsulosin [facility])  0.4 MG CAPS capsule Take 1 capsule (0.4 mg total) by mouth at bedtime. 09/08/13   Evanna Janann Augustori Burkett, NP  tamsulosin (FLOMAX) 0.4 MG CAPS capsule Take 1 capsule (0.4 mg total) by mouth at bedtime. 09/08/13   Evanna Janann Augustori Burkett, NP   BP 150/84  Pulse 89  Temp(Src) 98 F (36.7 C) (Oral)  Resp 16  Ht 5\' 8"  (1.727 m)  Wt 190 lb (86.183 kg)  BMI 28.90 kg/m2  SpO2 97% Physical Exam  Nursing note and vitals reviewed. Constitutional: He is oriented to person, place, and time. He appears well-developed and well-nourished.  HENT:  Head: Normocephalic.  Tender  maxillary sinuses, tender frontal sinuses  Eyes: Conjunctivae are normal. Pupils are equal, round, and reactive to light.  Cardiovascular: Normal rate, regular rhythm and normal heart sounds.   Pulmonary/Chest: Effort normal and breath sounds normal.  Abdominal: Soft.  Musculoskeletal: Normal range of motion.  Neurological: He is alert and oriented to person, place, and time. He has normal reflexes.  Skin: Skin is warm.  Psychiatric: He has a normal mood and affect.    ED Course  Procedures (including critical care time) Labs Review Labs Reviewed - No data to display  Imaging Review No results found.   EKG Interpretation None      MDM   Final diagnoses:  Recurrent frontal sinusitis, unspecified chronicity    levaquin Follow up with Dr. Jearld FentonByers if symptoms persist    Elson AreasLeslie K Sofia, [place] JerseyPA-C 10/17/13 1648

## 2013-10-17 NOTE — Discharge Instructions (Signed)

## 2013-10-17 NOTE — ED Notes (Addendum)
[facility]. Pt c/o sinus pressure and chest congestin seen here 7/30 for same DX sinus infectin

## 2013-10-28 ENCOUNTER — Encounter (HOSPITAL_BASED_OUTPATIENT_CLINIC_OR_DEPARTMENT_OTHER): Payer: Self-pay | Admitting: Emergency Medicine

## 2013-10-28 ENCOUNTER — Emergency Department (HOSPITAL_BASED_OUTPATIENT_CLINIC_OR_DEPARTMENT_OTHER)
Admission: EM | Admit: 2013-10-28 | Discharge: 2013-10-28 | Disposition: A | Discharge status: Home / Self Care | Payer: Non-veteran care | Attending: Emergency Medicine | Admitting: Emergency Medicine

## 2013-10-28 DIAGNOSIS — Z86718 Personal history of other venous thrombosis and embolism: Secondary | ICD-10-CM | POA: Insufficient documentation

## 2013-10-28 DIAGNOSIS — X500XXA Overexertion from strenuous movement or load, initial encounter: Secondary | ICD-10-CM | POA: Insufficient documentation

## 2013-10-28 DIAGNOSIS — S39011A Strain of muscle, fascia and tendon of abdomen, initial encounter: Secondary | ICD-10-CM

## 2013-10-28 DIAGNOSIS — Z7901 Long term (current) use of anticoagulants: Secondary | ICD-10-CM | POA: Insufficient documentation

## 2013-10-28 DIAGNOSIS — Z87448 Personal history of other diseases of urinary system: Secondary | ICD-10-CM | POA: Insufficient documentation

## 2013-10-28 DIAGNOSIS — Z8659 Personal history of other mental and behavioral disorders: Secondary | ICD-10-CM | POA: Insufficient documentation

## 2013-10-28 DIAGNOSIS — IMO0002 Reserved for concepts with insufficient information to code with codable children: Secondary | ICD-10-CM | POA: Insufficient documentation

## 2013-10-28 DIAGNOSIS — I10 Essential (primary) hypertension: Secondary | ICD-10-CM | POA: Insufficient documentation

## 2013-10-28 DIAGNOSIS — Y929 Unspecified place or not applicable: Secondary | ICD-10-CM | POA: Insufficient documentation

## 2013-10-28 DIAGNOSIS — Y9302 Activity, running: Secondary | ICD-10-CM | POA: Insufficient documentation

## 2013-10-28 DIAGNOSIS — Z792 Long term (current) use of antibiotics: Secondary | ICD-10-CM | POA: Insufficient documentation

## 2013-10-28 DIAGNOSIS — Z79899 Other long term (current) drug therapy: Secondary | ICD-10-CM | POA: Insufficient documentation

## 2013-10-28 DIAGNOSIS — Z87891 Personal history of nicotine dependence: Secondary | ICD-10-CM | POA: Insufficient documentation

## 2013-10-28 DIAGNOSIS — S3981XA Other specified injuries of abdomen, initial encounter: Secondary | ICD-10-CM | POA: Insufficient documentation

## 2013-10-28 MED ORDER — CYCLOBENZAPRINE HCL 10 MG PO TABS
10.0000 mg | ORAL_TABLET | Freq: Once | ORAL | Status: AC
  Administered 2013-10-28: 10 mg via ORAL
  Filled 2013-10-28: qty 1

## 2013-10-28 MED ORDER — CYCLOBENZAPRINE HCL 10 MG PO TABS: 10.0000 mg | ORAL_TABLET | Freq: Three times a day (TID) | ORAL | Status: DC | PRN

## 2013-10-28 MED ORDER — IBUPROFEN 800 MG PO TABS
800.0000 mg | ORAL_TABLET | Freq: Once | ORAL | Status: AC
Start: 2013-10-28 — End: 2013-10-28
  Administered 2013-10-28: 800 mg via ORAL
  Filled 2013-10-28: qty 1

## 2013-10-28 MED ORDER — IBUPROFEN 800 MG PO TABS: 800.0000 mg | ORAL_TABLET | Freq: Three times a day (TID) | ORAL | Status: DC | PRN

## 2013-10-28 NOTE — Discharge Instructions (Signed)
Groin Strain °A groin strain (also called a groin pull) is an injury to the muscles or tendon on the upper inner part of the thigh. These muscles are called the adductor muscles or groin muscles. They are responsible for moving the leg across the body. A muscle strain occurs when a muscle is overstretched and some muscle fibers are torn. A groin strain can range from mild to severe depending on how many muscle fibers are affected and whether the muscle fibers are partially or completely torn.  °Groin strains usually occur during exercise or participation in sports. The injury often happens when a sudden, violent force is placed on a muscle, stretching the muscle too far. A strain is more likely to occur when your muscles are not warmed up or if you are not properly conditioned. Depending on the severity of the groin strain, recovery time may vary from a few weeks to several weeks. Severe injuries often require 4-6 weeks for recovery. In these cases, complete healing can take 4-5 months.  °CAUSES  °· Stretching the groin muscles too far or too suddenly, often during side-to-side motion with an abrupt change in direction. °· Putting repeated stress on the groin muscles over a long period of time. °· Performing vigorous activity without properly stretching the groin muscles beforehand. °SYMPTOMS  °· Pain and tenderness in the groin area. This begins as sharp pain and persists as a dull ache. °· Popping or snapping feeling when the injury occurs (for severe strains). °· Swelling or bruising. °· Muscle spasms. °· Weakness in the leg. °· Stiffness in the groin area with decreased ability to move the affected muscles. °DIAGNOSIS  °Your caregiver will perform a physical exam to diagnose a groin strain. You will be asked about your symptoms and how the injury occurred. X-rays are sometimes needed to rule out a broken bone or cartilage problems. Your caregiver may order a CT scan or MRI if a complete muscle tear is  suspected. °TREATMENT  °A groin strain will often heal on its own. Your caregiver may prescribe medicines to help manage pain and swelling (anti-inflammatory medicine). You may be told to use crutches for the first few days to minimize your pain. °HOME CARE INSTRUCTIONS  °· Rest. Do not use the strained muscle if it causes pain. °· Put ice on the injured area. °¨ Put ice in a plastic bag. °¨ Place a towel between your skin and the bag. °¨ Leave the ice on for 15-20 minutes, every 2-3 hours. Do this for the first 2 days after the injury.  °· Only take over-the-counter or prescription medicines as directed by your caregiver. °· Wrap the injured area with an elastic bandage as directed by your caregiver. °· Keep the injured leg raised (elevated). °· Walk, stretch, and perform range-of-motion exercises to improve blood flow to the injured area. Only perform these activities if you can do so without any pain. °To prevent muscle strains: °· Warm up before exercise. °· Develop proper conditioning and strength in the groin muscles. °SEEK IMMEDIATE MEDICAL CARE IF:  °· You have increased pain or swelling in the affected area.   °· Your symptoms are not improving or are getting worse. °MAKE SURE YOU:  °· Understand these instructions. °· Will watch your condition. °· Will get help right away if you are not doing well or get worse. °Document Released: 10/23/2003 Document Revised: 02/11/2012 Document Reviewed: 10/29/2011 °Exit[employer]® Patient Information ©2015 [employer]. This information is not intended to replace advice given to you   by your health care provider. Make sure you discuss any questions you have with your health care provider. ° °

## 2013-10-28 NOTE — ED Notes (Signed)
MD at bedside. 

## 2013-10-28 NOTE — ED Provider Notes (Signed)
CSN: [id number]     Arrival date & time 10/28/13  1538 History   First MD Initiated Contact with Patient 10/28/13 1548     Chief Complaint  Patient presents with  . Groin Pain     (Consider location/radiation/quality/duration/timing/severity/associated sxs/prior Treatment) HPI Comments: R groin pain began while running. Did not fall or twist his leg.  Patient is a 48 y.o. male presenting with groin pain. The history is provided by the patient.  Groin Pain This is a new problem. The current episode started 3 to 5 hours ago. The problem occurs constantly. The problem has not changed since onset.Pertinent negatives include no chest pain, no abdominal pain and no shortness of breath. The symptoms are aggravated by walking and twisting. Nothing relieves the symptoms. He has tried nothing for the symptoms.    Past Medical History  Diagnosis Date  . DVT (deep venous thrombosis) 01/08/2011    lt leg  . DVT of lower limb, acute 01/12/2011  . BPH (benign prostatic hyperplasia)   . Current use of long term anticoagulation   . 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 Noncompliance w/medication treatment due to intermit use of medication   . History of tobacco use     QUIT early 2013  . Hypertension   . Depression   . Enlarged prostate    Past Surgical History  Procedure Laterality Date  . Tonsillectomy     No family history on file. History  Substance Use Topics  . Smoking status: Former Games developer  . Smokeless tobacco: Never Used  . Alcohol Use: No     Comment: no alcohol since 09/01/13    Review of Systems  Constitutional: Negative for fever and chills.  Respiratory: Negative for cough and shortness of breath.   Cardiovascular: Negative for chest pain and leg swelling.  Gastrointestinal: Negative for vomiting and abdominal pain.  All other systems reviewed and are negative.     Allergies  Review of patient's allergies indicates no  known allergies.  Home Medications   Prior to Admission medications   Medication Sig Start Date End Date Taking? Authorizing Provider  amoxicillin-clavulanate (AUGMENTIN) 875-125 MG per tablet Take 1 tablet by mouth 2 (two) times daily.    Historical Provider, MD  cholecalciferol (VITAMIN D) 1000 UNITS tablet Take 2 tablets (2,000 Units total) by mouth daily. 09/08/13   Evanna Janann August, NP  cyclobenzaprine (FLEXERIL) 10 MG tablet Take 1 tablet (10 mg total) by mouth 3 (three) times daily as needed for muscle spasms. 09/08/13   Evanna Janann August, NP  DULoxetine (CYMBALTA) 30 MG capsule Take 1 capsule (30 mg total) by mouth daily. 09/08/13   Evanna Janann August, NP  levofloxacin (LEVAQUIN) 500 MG tablet Take 1 tablet (500 mg total) by mouth daily. 10/17/13   Elson Areas, PA-C  Multiple Vitamins-Minerals (MULTIVITAMIN WITH MINERALS) tablet Take 1 tablet by mouth daily. 09/08/13   Evanna Janann August, NP  rivaroxaban (XARELTO) 20 MG TABS tablet Take 1 tablet (20 mg total) by mouth daily with supper. 09/08/13   Evanna Janann August, NP  rivaroxaban (XARELTO) 20 MG TABS tablet Take 1 tablet (20 mg total) by mouth every morning. 09/08/13   Evanna Janann August, NP  tamsulosin (FLOMAX) 0.4 MG CAPS capsule Take 1 capsule (0.4 mg total) by mouth at bedtime. 09/08/13   Evanna Janann August, NP  tamsulosin (FLOMAX) 0.4 MG CAPS capsule Take 1 capsule (0.4 mg total) by mouth at  bedtime. 09/08/13   Evanna Janann Augustori Burkett, NP   BP 140/89  Pulse 77  Temp(Src) 98.3 F (36.8 C) (Oral)  Resp 16  Ht 5\' 8"  (1.727 m)  Wt 197 lb (89.359 kg)  BMI 29.96 kg/m2  SpO2 99% Physical Exam  Nursing note and vitals reviewed. Constitutional: He is oriented to person, place, and time. He appears well-developed and well-nourished. No distress.  HENT:  Head: Normocephalic and atraumatic.  Mouth/Throat: No oropharyngeal exudate.  Eyes: EOM are normal. Pupils are equal, round, and reactive to light.  Neck: Normal range of motion. Neck  supple.  Cardiovascular: Normal rate and regular rhythm.  Exam reveals no friction rub.   No murmur heard. Pulmonary/Chest: Effort normal and breath sounds normal. No respiratory distress. He has no wheezes. He has no rales.  Abdominal: Soft. He exhibits no distension. There is no tenderness. There is no rebound. Hernia confirmed negative in the right inguinal area and confirmed negative in the left inguinal area.  Genitourinary: Testes normal and penis normal. Right testis shows no mass, no swelling and no tenderness. Left testis shows no mass, no swelling and no tenderness.  Musculoskeletal: He exhibits no edema.       Right hip: He exhibits decreased range of motion (due to inner thigh pain) and tenderness (when palpating laterally, makes inner thigh hurt). He exhibits normal strength, no bony tenderness, no swelling, no crepitus, no deformity and no laceration.       Right upper leg: He exhibits tenderness (inner thigh musculature proximally). He exhibits no bony tenderness, no swelling, no edema, no deformity and no laceration.  Lymphadenopathy:       Right: No inguinal adenopathy present.       Left: No inguinal adenopathy present.  Neurological: He is alert and oriented to person, place, and time.  Skin: He is not diaphoretic.    ED Course  Procedures (including critical care time) Labs Review Labs Reviewed - No data to display  Imaging Review No results found.   EKG Interpretation None      MDM   Final diagnoses:  Strain of groin, initial encounter    39M here with R groin pain. Began while running earlier today. No falls, did not step in any holes or twist his leg. Continued pain since[facility] AFVSS here. Has R inner thigh tenderness. NVI distally. No ASIS tenderness, mild lateral hip tenderness, but states pain is in inner thigh when palpation lateral hip. Pain with ROM. GU exam normal, no hernias appreciated, no testicular pain or  swelling. Abdominal exam normal.  Will give motrin and flexeril. Stable for discharge.    Elwin MochaBlair Camyla Camposano, MD 10/28/13 [phone]

## 2013-10-28 NOTE — ED Notes (Signed)
Sudden bil groin pain while "sprinting" today about noon.  Pt stopped running but right side is still hurting.  Pt reports a "knot" in the area.  [facility]  Number to call for return ride is [phone] - Sherry's cell phone.

## 2014-02-16 ENCOUNTER — Encounter (HOSPITAL_COMMUNITY): Payer: Self-pay | Admitting: Vascular Surgery

## 2015-01-16 ENCOUNTER — Encounter (HOSPITAL_COMMUNITY): Payer: Self-pay | Admitting: Emergency Medicine

## 2015-01-16 ENCOUNTER — Emergency Department (HOSPITAL_COMMUNITY)
Admission: EM | Admit: 2015-01-16 | Discharge: 2015-01-17 | Disposition: A | Discharge status: Home / Self Care | Payer: Non-veteran care | Attending: Emergency Medicine | Admitting: Emergency Medicine

## 2015-01-16 DIAGNOSIS — Z86718 Personal history of other venous thrombosis and embolism: Secondary | ICD-10-CM | POA: Diagnosis not present

## 2015-01-16 DIAGNOSIS — M542 Cervicalgia: Secondary | ICD-10-CM | POA: Diagnosis not present

## 2015-01-16 DIAGNOSIS — F329 Major depressive disorder, single episode, unspecified: Secondary | ICD-10-CM | POA: Diagnosis not present

## 2015-01-16 DIAGNOSIS — Z87891 Personal history of nicotine dependence: Secondary | ICD-10-CM | POA: Diagnosis not present

## 2015-01-16 DIAGNOSIS — R079 Chest pain, unspecified: Secondary | ICD-10-CM | POA: Insufficient documentation

## 2015-01-16 DIAGNOSIS — M2550 Pain in unspecified joint: Secondary | ICD-10-CM

## 2015-01-16 DIAGNOSIS — I10 Essential (primary) hypertension: Secondary | ICD-10-CM | POA: Diagnosis not present

## 2015-01-16 DIAGNOSIS — Z79899 Other long term (current) drug therapy: Secondary | ICD-10-CM | POA: Diagnosis not present

## 2015-01-16 DIAGNOSIS — M25562 Pain in left knee: Secondary | ICD-10-CM | POA: Diagnosis not present

## 2015-01-16 DIAGNOSIS — R202 Paresthesia of skin: Secondary | ICD-10-CM | POA: Insufficient documentation

## 2015-01-16 DIAGNOSIS — M719 Bursopathy, unspecified: Secondary | ICD-10-CM

## 2015-01-16 DIAGNOSIS — Z9114 Patient's other noncompliance with medication regimen: Secondary | ICD-10-CM | POA: Diagnosis not present

## 2015-01-16 DIAGNOSIS — M25511 Pain in right shoulder: Secondary | ICD-10-CM | POA: Insufficient documentation

## 2015-01-16 DIAGNOSIS — Z7901 Long term (current) use of anticoagulants: Secondary | ICD-10-CM | POA: Insufficient documentation

## 2015-01-16 DIAGNOSIS — N400 Enlarged prostate without lower urinary tract symptoms: Secondary | ICD-10-CM | POA: Insufficient documentation

## 2015-01-16 DIAGNOSIS — M79621 Pain in right upper arm: Secondary | ICD-10-CM | POA: Diagnosis present

## 2015-01-16 NOTE — ED Notes (Signed)
Pt states that he has R sided chest pain that goes down his R arm and his R hand is numb. Also states that he has chronic L knee pain. Alert and oriented. Neuro intact.

## 2015-01-17 ENCOUNTER — Other Ambulatory Visit (HOSPITAL_COMMUNITY): Payer: Self-pay

## 2015-01-17 ENCOUNTER — Emergency Department (HOSPITAL_COMMUNITY): Payer: Non-veteran care

## 2015-01-17 LAB — CBC
HCT: 43.3 % (ref 39.0–52.0)
HEMOGLOBIN: 15 g/dL (ref 13.0–17.0)
MCH: 32.1 pg (ref 26.0–34.0)
MCHC: 34.6 g/dL (ref 30.0–36.0)
MCV: 92.5 fL (ref 78.0–100.0)
Platelets: 282 10*3/uL (ref 150–400)
RBC: 4.68 MIL/uL (ref 4.22–5.81)
RDW: 12.7 % (ref 11.5–15.5)
WBC: 5.9 10*3/uL (ref 4.0–10.5)

## 2015-01-17 LAB — COMPREHENSIVE METABOLIC PANEL
ALBUMIN: 4.1 g/dL (ref 3.5–5.0)
ALK PHOS: 91 U/L (ref 38–126)
ALT: 47 U/L (ref 17–63)
AST: 35 U/L (ref 15–41)
Anion gap: 6 (ref 5–15)
BILIRUBIN TOTAL: 0.7 mg/dL (ref 0.3–1.2)
BUN: 19 mg/dL (ref 6–20)
CALCIUM: 9.4 mg/dL (ref 8.9–10.3)
CO2: 25 mmol/L (ref 22–32)
CREATININE: 1.31 mg/dL — AB (ref 0.61–1.24)
Chloride: 103 mmol/L (ref 101–111)
GFR calc Af Amer: >60 mL/min (ref >60)
GFR calc non Af Amer: >60 mL/min (ref >60)
GLUCOSE: 240 mg/dL — AB (ref 65–99)
Potassium: 4.5 mmol/L (ref 3.5–5.1)
Sodium: 134 mmol/L — ABNORMAL LOW (ref 135–145)
TOTAL PROTEIN: 7.1 g/dL (ref 6.5–8.1)

## 2015-01-17 LAB — URINALYSIS, ROUTINE W REFLEX MICROSCOPIC
Bilirubin Urine: NEGATIVE
Hgb urine dipstick: NEGATIVE
KETONES UR: NEGATIVE mg/dL
LEUKOCYTES UA: NEGATIVE
Nitrite: NEGATIVE
PROTEIN: NEGATIVE mg/dL
Specific Gravity, Urine: 1.036 — ABNORMAL HIGH (ref 1.005–1.030)
UROBILINOGEN UA: 0.2 mg/dL (ref 0.0–1.0)
pH: 5 (ref 5.0–8.0)

## 2015-01-17 LAB — TROPONIN I: Troponin I: <0.03 ng/mL (ref <0.031)

## 2015-01-17 LAB — PROTIME-INR
INR: 1.91 — ABNORMAL HIGH (ref 0.00–1.49)
Prothrombin Time: 21.8 seconds — ABNORMAL HIGH (ref 11.6–15.2)

## 2015-01-17 LAB — URINE MICROSCOPIC-ADD ON

## 2015-01-17 MED ORDER — HYDROCODONE-ACETAMINOPHEN 5-325 MG PO TABS: 2.0000 | ORAL_TABLET | ORAL | Status: DC | PRN

## 2015-01-17 NOTE — Discharge Instructions (Signed)
Joint Pain Joint pain can be caused by many things. The joint can be bruised, infected, weak from aging, or sore from exercise. The pain will probably go away if you follow your doctor's instructions for home care. If your joint pain continues, more tests may be needed to help find the cause of your condition. HOME CARE Watch your condition for any changes. Follow these instructions as told to lessen the pain that you are feeling:  Take medicines only as told by your doctor.  Rest the sore joint for as long as told by your doctor. If your doctor tells you to, raise (elevate) the painful joint above the level of your heart while you are sitting or lying down.  Do not do things that cause pain or make the pain worse.  If told, put ice on the painful area:  Put ice in a plastic bag.  Place a towel between your skin and the bag.  Leave the ice on for 20 minutes, 2-3 times per day.  Wear an elastic bandage, splint, or sling as told by your doctor. Loosen the bandage or splint if your fingers or toes lose feeling (become numb) and tingle, or if they turn cold and blue.  Begin exercising or stretching the joint as told by your doctor. Ask your doctor what types of exercise are safe for you.  Keep all follow-up visits as told by your doctor. This is important. GET HELP IF:  Your pain gets worse and medicine does not help it.  Your joint pain does not get better in 3 days.  You have more bruising or swelling.  You have a fever.  You lose 10 pounds (4.5 kg) or more without trying. GET HELP RIGHT AWAY IF:  You are not able to move the joint.  Your fingers or toes become numb or they turn cold and blue.   This information is not intended to replace advice given to you by your health care provider. Make sure you discuss any questions you have with your health care provider.   Document Released: 02/12/2009 Document Revised: 03/17/2014 Document Reviewed: 12/06/2013 Elsevier Interactive  Patient Education 2016 Elsevier Inc. Bursitis Bursitis is inflammation and irritation of a bursa, which is one of the small, fluid-filled sacs that cushion and protect the moving parts of your body. These sacs are located between bones and muscles, muscle attachments, or skin areas next to bones. A bursa protects these structures from the wear and tear that results from frequent movement. An inflamed bursa causes pain and swelling. Fluid may build up inside the sac. Bursitis is most common near joints, especially the knees, elbows, hips, and shoulders. CAUSES Bursitis can be caused by:   Injury from:  A direct blow, like falling on your knee or elbow.  Overuse of a joint (repetitive stress).  Infection. This can happen if bacteria gets into a bursa through a cut or scrape near a joint.  Diseases that cause joint inflammation, such as gout and rheumatoid arthritis. RISK FACTORS You may be at risk for bursitis if you:   Have a job or hobby that involves a lot of repetitive stress on your joints.  Have a condition that weakens your body's defense system (immune system), such as diabetes, cancer, or HIV.  Lift and reach overhead often.  Kneel or lean on hard surfaces often.  Run or walk often. SIGNS AND SYMPTOMS The most common signs and symptoms of bursitis are:  Pain that gets worse when you move  the affected body part or put weight on it.  Inflammation.  Stiffness. Other signs and symptoms may include:  Redness.  Tenderness.  Warmth.  Pain that continues after rest.  Fever and chills. This may occur in bursitis caused by infection. DIAGNOSIS Bursitis may be diagnosed by:   Medical history and physical exam.  MRI.  A procedure to drain fluid from the bursa with a needle (aspiration). The fluid may be checked for signs of infection or gout.  Blood tests to rule out other causes of inflammation. TREATMENT  Bursitis can usually be treated at home with rest, ice,  compression, and elevation (RICE). For mild bursitis, RICE treatment may be all you need. Other treatments may include:  Nonsteroidal anti-inflammatory drugs (NSAIDs) to treat pain and inflammation.  Corticosteroids to fight inflammation. You may have these drugs injected into and around the area of bursitis.  Aspiration of bursitis fluid to relieve pain and improve movement.  Antibiotic medicine to treat an infected bursa.  A splint, brace, or walking aid.  Physical therapy if you continue to have pain or limited movement.  Surgery to remove a damaged or infected bursa. This may be needed if you have a very bad case of bursitis or if other treatments have not worked. HOME CARE INSTRUCTIONS   Take medicines only as directed by your health care provider.  If you were prescribed an antibiotic medicine, finish it all even if you start to feel better.  Rest the affected area as directed by your health care provider.  Keep the area elevated.  Avoid activities that make pain worse.  Apply ice to the injured area:  Place ice in a plastic bag.  Place a towel between your skin and the bag.  Leave the ice on for 20 minutes, 2-3 times a day.  Use splints, braces, pads, or walking aids as directed by your health care provider.  Keep all follow-up visits as directed by your health care provider. This is important. PREVENTION   Wear knee pads if you kneel often.  Wear sturdy running or walking shoes that fit you well.  Take regular breaks from repetitive activity.  Warm up by stretching before doing any strenuous activity.  Maintain a healthy weight or lose weight as recommended by your health care provider. Ask your health care provider if you need help.  Exercise regularly. Start any new physical activity gradually. SEEK MEDICAL CARE IF:   Your bursitis is not responding to treatment or home care.  You have a fever.  You have chills.   This information is not intended  to replace advice given to you by your health care provider. Make sure you discuss any questions you have with your health care provider.   Document Released: 02/22/2000 Document Revised: 11/15/2014 Document Reviewed: 05/16/2013 Elsevier Interactive Patient Education [employer]2016 Elsevier Inc.

## 2015-01-17 NOTE — ED Provider Notes (Signed)
CSN: [id number]     Arrival date & time 01/16/15  2348 History  By signing my name below, I, [facility], attest that this documentation has been prepared under the direction and in the presence of [name], MD. Electronically Signed: Randell Patient, ED Scribe. 01/17/2015. 12:45 AM.     Chief Complaint  Patient presents with  . Chest Pain  . Knee Pain  . Arm Pain    The history is provided by the patient. No language interpreter was used.   HPI Comments: [name] is a 49 y.o. male who presents to the Emergency Department complaining of 3 weeks of constant, unchanged right shoulder pain with radiation to the right chest and right neck. He has associated right hand paresthesias. He states the pain is worse with movement. He denies weakness.   He also complains of chronic, constant left knee pain which he describes as sharp and stabbing. He states he usually wears a brace and takes Tramadol for pain.   Past Medical History  Diagnosis Date  . DVT (deep venous thrombosis) (HCC) 01/08/2011    lt leg  . DVT of lower limb, acute (HCC) 01/12/2011  . BPH (benign prostatic hyperplasia)   . Current use of long term anticoagulation   . 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 Noncompliance w/medication treatment due to intermit use of medication   . History of tobacco use     QUIT early 2013  . Hypertension   . Depression   . Enlarged prostate    Past Surgical History  Procedure Laterality Date  . Tonsillectomy    . Bilateral upper extremity angiogram N/A 07/23/2011    Procedure: BILATERAL UPPER EXTREMITY ANGIOGRAM;  Surgeon: Fransisco Hertz, MD;  Location: [facility] CATH LAB;  Service: Cardiovascular;  Laterality: N/A;   History reviewed. No pertinent family history. Social History  Substance Use Topics  . Smoking status: Former Games developer  . Smokeless tobacco: Never Used  . Alcohol Use: No     Comment: no alcohol  since 09/01/13    Review of Systems  Cardiovascular: Positive for chest pain (right).  Musculoskeletal: Positive for myalgias, arthralgias (right shoulder, left knee) and neck pain (right ).  Neurological: Positive for numbness (paresthesia to right hand). Negative for weakness.  All other systems reviewed and are negative.     Allergies  Review of patient's allergies indicates no known allergies.  Home Medications   Prior to Admission medications   Medication Sig Start Date End Date Taking? Authorizing Provider  acetaminophen (TYLENOL) 650 MG CR tablet Take 650 mg by mouth every 8 (eight) hours as needed for pain.   Yes Historical Provider, MD  B Complex-C (B-COMPLEX WITH VITAMIN C) tablet Take 1 tablet by mouth daily.   Yes Historical Provider, MD  LISINOPRIL PO Take 0.5 tablets by mouth daily.   Yes Historical Provider, MD  metFORMIN (GLUCOPHAGE) 500 MG tablet Take 500 mg by mouth daily with breakfast.   Yes Historical Provider, MD  Multiple Vitamins-Minerals (MULTIVITAMIN WITH MINERALS) tablet Take 1 tablet by mouth daily. 09/08/13  Yes [name], NP  rivaroxaban (XARELTO) 20 MG TABS tablet Take 1 tablet (20 mg total) by mouth daily with supper. 09/08/13  Yes [name], NP  tamsulosin (FLOMAX) 0.4 MG CAPS capsule Take 1 capsule (0.4 mg total) by mouth at bedtime. 09/08/13  Yes [name], NP  traMADol (ULTRAM) 50 MG tablet Take 50 mg by mouth  every 6 (six) hours as needed for moderate pain.   Yes Historical Provider, MD  cholecalciferol (VITAMIN D) 1000 UNITS tablet Take 2 tablets (2,000 Units total) by mouth daily. Patient not taking: Reported on 01/17/2015 09/08/13   Kizzie FantasiaEvanna Burkett, NP  cyclobenzaprine (FLEXERIL) 10 MG tablet Take 1 tablet (10 mg total) by mouth 3 (three) times daily as needed for muscle spasms. Patient not taking: Reported on 01/17/2015 09/08/13   Kizzie FantasiaEvanna Burkett, NP  cyclobenzaprine (FLEXERIL) 10 MG tablet Take 1 tablet (10 mg total) by mouth 3 (three) times  daily as needed for muscle spasms. Patient not taking: Reported on 01/17/2015 10/28/13   Elwin MochaBlair Walden, MD  DULoxetine (CYMBALTA) 30 MG capsule Take 1 capsule (30 mg total) by mouth daily. Patient not taking: Reported on 01/17/2015 09/08/13   Kizzie FantasiaEvanna Burkett, NP  ibuprofen (ADVIL,MOTRIN) 800 MG tablet Take 1 tablet (800 mg total) by mouth every 8 (eight) hours as needed for moderate pain. Patient not taking: Reported on 01/17/2015 10/28/13   Elwin MochaBlair Walden, MD  levofloxacin (LEVAQUIN) 500 MG tablet Take 1 tablet (500 mg total) by mouth daily. Patient not taking: Reported on 01/17/2015 10/17/13   Elson AreasLeslie K Sofia, PA-C  rivaroxaban (XARELTO) 20 MG TABS tablet Take 1 tablet (20 mg total) by mouth every morning. Patient not taking: Reported on 01/17/2015 09/08/13   Kizzie FantasiaEvanna Burkett, NP  tamsulosin (FLOMAX) 0.4 MG CAPS capsule Take 1 capsule (0.4 mg total) by mouth at bedtime. Patient not taking: Reported on 01/17/2015 09/08/13   Kizzie FantasiaEvanna Burkett, NP   BP 123/73 mmHg  Pulse 80  Temp(Src) 97.7 F (36.5 C) (Oral)  SpO2 93% Physical Exam  Constitutional: He is oriented to person, place, and time. He appears well-developed and well-nourished. No distress.  HENT:  Head: Normocephalic and atraumatic.  Right Ear: Hearing normal.  Left Ear: Hearing normal.  Nose: Nose normal.  Mouth/Throat: Oropharynx is clear and moist and mucous membranes are normal.  Eyes: Conjunctivae and EOM are normal. Pupils are equal, round, and reactive to light.  Neck: Normal range of motion. Neck supple.  Cardiovascular: Normal rate, regular rhythm, S1 normal and S2 normal.  Exam reveals no gallop and no friction rub.   No murmur heard. Pulmonary/Chest: Effort normal and breath sounds normal. No respiratory distress. He exhibits no tenderness.  Abdominal: Soft. Normal appearance and bowel sounds are normal. There is no hepatosplenomegaly. There is no tenderness. There is no rebound, no guarding, no tenderness at McBurney's point and negative  Murphy's sign. No hernia.  Musculoskeletal: He exhibits tenderness.  Right shoulder with severe tenderness of anterior subacromial space. Decreased ROM secondary to pain. No deformities Left knee has medial tenderness and increased pain with stress of the MCL.   Neurological: He is alert and oriented to person, place, and time. He has normal strength. No cranial nerve deficit or sensory deficit. Coordination normal. GCS eye subscore is 4. GCS verbal subscore is 5. GCS motor subscore is 6.  Skin: Skin is warm, dry and intact. No rash noted. No cyanosis.  Psychiatric: He has a normal mood and affect. His speech is normal and behavior is normal. Thought content normal.  Nursing note and vitals reviewed.   ED Course  Procedures (including critical care time)  DIAGNOSTIC STUDIES: Oxygen Saturation is 93% on RA, adequate by my interpretation.    COORDINATION OF CARE: 12:35 AM Discussed treatment plan with pt at bedside and pt agreed to plan.   Labs Review Labs Reviewed  COMPREHENSIVE METABOLIC PANEL - Abnormal;  Notable for the following:    Sodium 134 (*)    Glucose, Bld 240 (*)    Creatinine, Ser 1.31 (*)    All other components within normal limits  PROTIME-INR - Abnormal; Notable for the following:    Prothrombin Time 21.8 (*)    INR 1.91 (*)    All other components within normal limits  URINALYSIS, ROUTINE W REFLEX MICROSCOPIC (NOT AT [facility]) - Abnormal; Notable for the following:    Specific Gravity, Urine 1.036 (*)    Glucose, UA >1000 (*)    All other components within normal limits  CBC  TROPONIN I  URINE MICROSCOPIC-ADD ON    Imaging Review Dg Chest 2 View  01/17/2015  CLINICAL DATA:  Right anterior shoulder pain. Right anterior chest pain for 3 weeks. EXAM: CHEST  2 VIEW COMPARISON:  03/23/2012 FINDINGS: Normal heart size and mediastinal contours. No acute infiltrate or edema. No effusion or pneumothorax. No acute osseous findings. IMPRESSION: Negative chest.  Electronically Signed   By: Marnee Spring M.D.   On: 01/17/2015 00:35   I have personally reviewed and evaluated these images and lab results as part of my medical decision-making.   EKG Interpretation   Date/Time:  Wednesday January 17 2015 00:46:22 EST Ventricular Rate:  74 PR Interval:  169 QRS Duration: 81 QT Interval:  343 QTC Calculation: 380 R Axis:   39 Text Interpretation:  Sinus rhythm Nonspecific T abnormalities, diffuse  leads No significant change since last tracing Confirmed by Dock Baccam  MD,  Khamani Daniely [phone]) on 01/17/2015 12:56:20 AM      MDM   Final diagnoses:  None  Bursitis Knee Pain   Patient presents to the ER for evaluation of pain centered around the right shoulder which does radiate to the chest, neck and down his arm. Patient reports that symptoms have been ongoing for 3 weeks but have worsened recently. He reports severe pain with movement of the shoulder. There has not been any direct injury. Examination reveals significant point tenderness at the anterior shoulder in the area of the subacromial bursa. He has significant pain and inhibition of range of motion secondary to pain of the right shoulder. There are no neurologic deficits noted. Patient's symptoms are not consistent with cardiac etiology at all. Cardiac evaluation unremarkable.  Any of left knee pain. He has point tenderness at the [facility] region without any signs of infection. No overlying erythema, warmth and no joint effusion noted.  Patient will be treated with analgesia and is to follow-up with orthopedics for both of these complaints.  I personally performed the services described in this documentation, which was scribed in my presence. The recorded information has been reviewed and is accurate.     [name], MD 01/17/15 [phone]

## 2015-01-19 ENCOUNTER — Encounter (HOSPITAL_COMMUNITY): Payer: Self-pay | Admitting: Emergency Medicine

## 2015-01-19 ENCOUNTER — Emergency Department (HOSPITAL_COMMUNITY): Payer: Non-veteran care

## 2015-01-19 ENCOUNTER — Emergency Department (HOSPITAL_COMMUNITY)
Admission: EM | Admit: 2015-01-19 | Discharge: 2015-01-19 | Disposition: A | Discharge status: Home / Self Care | Payer: Non-veteran care | Attending: Emergency Medicine | Admitting: Emergency Medicine

## 2015-01-19 DIAGNOSIS — R55 Syncope and collapse: Secondary | ICD-10-CM | POA: Insufficient documentation

## 2015-01-19 DIAGNOSIS — R0602 Shortness of breath: Secondary | ICD-10-CM | POA: Diagnosis not present

## 2015-01-19 DIAGNOSIS — Z87891 Personal history of nicotine dependence: Secondary | ICD-10-CM | POA: Diagnosis not present

## 2015-01-19 DIAGNOSIS — Z7901 Long term (current) use of anticoagulants: Secondary | ICD-10-CM | POA: Diagnosis not present

## 2015-01-19 DIAGNOSIS — Z794 Long term (current) use of insulin: Secondary | ICD-10-CM | POA: Diagnosis not present

## 2015-01-19 DIAGNOSIS — Z86718 Personal history of other venous thrombosis and embolism: Secondary | ICD-10-CM | POA: Diagnosis not present

## 2015-01-19 DIAGNOSIS — I10 Essential (primary) hypertension: Secondary | ICD-10-CM | POA: Insufficient documentation

## 2015-01-19 DIAGNOSIS — Z79899 Other long term (current) drug therapy: Secondary | ICD-10-CM | POA: Diagnosis not present

## 2015-01-19 DIAGNOSIS — Z8659 Personal history of other mental and behavioral disorders: Secondary | ICD-10-CM | POA: Insufficient documentation

## 2015-01-19 DIAGNOSIS — G43909 Migraine, unspecified, not intractable, without status migrainosus: Secondary | ICD-10-CM | POA: Diagnosis not present

## 2015-01-19 DIAGNOSIS — M7541 Impingement syndrome of right shoulder: Secondary | ICD-10-CM | POA: Insufficient documentation

## 2015-01-19 DIAGNOSIS — Z87438 Personal history of other diseases of male genital organs: Secondary | ICD-10-CM | POA: Insufficient documentation

## 2015-01-19 DIAGNOSIS — M75101 Unspecified rotator cuff tear or rupture of right shoulder, not specified as traumatic: Secondary | ICD-10-CM | POA: Diagnosis not present

## 2015-01-19 LAB — URINALYSIS, ROUTINE W REFLEX MICROSCOPIC
BILIRUBIN URINE: NEGATIVE
Glucose, UA: 100 mg/dL — AB
HGB URINE DIPSTICK: NEGATIVE
KETONES UR: NEGATIVE mg/dL
Leukocytes, UA: NEGATIVE
NITRITE: NEGATIVE
PROTEIN: NEGATIVE mg/dL
Specific Gravity, Urine: 1.036 — ABNORMAL HIGH (ref 1.005–1.030)
UROBILINOGEN UA: 0.2 mg/dL (ref 0.0–1.0)
pH: 5 (ref 5.0–8.0)

## 2015-01-19 LAB — CBC
HEMATOCRIT: 40.7 % (ref 39.0–52.0)
HEMOGLOBIN: 14.2 g/dL (ref 13.0–17.0)
MCH: 31.6 pg (ref 26.0–34.0)
MCHC: 34.9 g/dL (ref 30.0–36.0)
MCV: 90.6 fL (ref 78.0–100.0)
PLATELETS: 264 10*3/uL (ref 150–400)
RBC: 4.49 MIL/uL (ref 4.22–5.81)
RDW: 12.6 % (ref 11.5–15.5)
WBC: 4.9 10*3/uL (ref 4.0–10.5)

## 2015-01-19 LAB — BASIC METABOLIC PANEL
ANION GAP: 9 (ref 5–15)
BUN: 13 mg/dL (ref 6–20)
CHLORIDE: 103 mmol/L (ref 101–111)
CO2: 24 mmol/L (ref 22–32)
Calcium: 9.1 mg/dL (ref 8.9–10.3)
Creatinine, Ser: 0.97 mg/dL (ref 0.61–1.24)
GFR calc Af Amer: >60 mL/min (ref >60)
GFR calc non Af Amer: >60 mL/min (ref >60)
GLUCOSE: 182 mg/dL — AB (ref 65–99)
POTASSIUM: 3.9 mmol/L (ref 3.5–5.1)
Sodium: 136 mmol/L (ref 135–145)

## 2015-01-19 LAB — CBG MONITORING, ED: GLUCOSE-CAPILLARY: 179 mg/dL — AB (ref 65–99)

## 2015-01-19 LAB — I-STAT TROPONIN, ED: Troponin i, poc: 0 ng/mL (ref 0.00–0.08)

## 2015-01-19 MED ORDER — SODIUM CHLORIDE 0.9 % IV SOLN
1000.0000 mL | Freq: Once | INTRAVENOUS | Status: AC
  Administered 2015-01-19: 1000 mL via INTRAVENOUS

## 2015-01-19 MED ORDER — SODIUM CHLORIDE 0.9 % IV SOLN
1000.0000 mL | INTRAVENOUS | Status: DC
  Administered 2015-01-19: 1000 mL via INTRAVENOUS

## 2015-01-19 MED ORDER — METOCLOPRAMIDE HCL 5 MG/ML IJ SOLN
10.0000 mg | Freq: Once | INTRAMUSCULAR | Status: AC
  Administered 2015-01-19: 10 mg via INTRAVENOUS
  Filled 2015-01-19: qty 2

## 2015-01-19 MED ORDER — DIPHENHYDRAMINE HCL 50 MG/ML IJ SOLN
25.0000 mg | Freq: Once | INTRAMUSCULAR | Status: AC
  Administered 2015-01-19: 25 mg via INTRAVENOUS
  Filled 2015-01-19: qty 1

## 2015-01-19 NOTE — ED Notes (Signed)
CBG 179. Nurse notified.

## 2015-01-19 NOTE — ED Provider Notes (Signed)
CSN: [id number]     Arrival date & time 01/19/15  0121 History  By signing my name below, I, [name], attest that this documentation has been prepared under the direction and in the presence of Dione Booze, MD. Electronically Signed: [name], ED Scribe. 01/19/2015. 2:54 AM.  Chief Complaint  Patient presents with  . Loss of Consciousness  . Chest Pain   The history is provided by the patient. No language interpreter was used.     HPI Comments: [name] is a 49 y.o. male brought in by ambulance, who presents to the Emergency Department complaining of sudden onset syncopal episode that occurred tonight. Pt states that he felt lightheaded and had a throbbing, diffuse, headache prior to passing out. Pt did not feel like he was going to pass out prior to onset. Pt was given NTG en route and states that it only exacerbated his headache. Pt notes some photophobia with the headache. He also complains of right shoulder pain radiating into right chest that has worsened over the past couple of weeks. Pt mentions that he has been short of breath for the past couple of weeks as well. Pt has been complaint with his hypertensive medication. Denies chest pressure, chest heaviness, diaphoresis, or any other associated symptoms.   Past Medical History  Diagnosis Date  . DVT (deep venous thrombosis) (HCC) 01/08/2011    lt leg  . DVT of lower limb, acute (HCC) 01/12/2011  . BPH (benign prostatic hyperplasia)   . Current use of long term anticoagulation   . 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 Noncompliance w/medication treatment due to intermit use of medication   . History of tobacco use     QUIT early 2013  . Hypertension   . Depression   . Enlarged prostate    Past Surgical History  Procedure Laterality Date  . Tonsillectomy    . Bilateral upper extremity angiogram N/A 07/23/2011    Procedure: BILATERAL UPPER EXTREMITY  ANGIOGRAM;  Surgeon: Fransisco Hertz, MD;  Location: [facility] CATH LAB;  Service: Cardiovascular;  Laterality: N/A;   History reviewed. No pertinent family history. Social History  Substance Use Topics  . Smoking status: Former Smoker    Quit date: 07/18/2013  . Smokeless tobacco: Never Used  . Alcohol Use: No     Comment: no alcohol since 09/01/13    Review of Systems  Constitutional: Negative for diaphoresis.  Eyes: Positive for photophobia.  Respiratory: Positive for shortness of breath.   Gastrointestinal: Negative for nausea and vomiting.  Musculoskeletal: Positive for arthralgias (Right shoulder).  Neurological: Positive for syncope, light-headedness and headaches. Negative for seizures.  All other systems reviewed and are negative.     Allergies  Review of patient's allergies indicates no known allergies.  Home Medications   Prior to Admission medications   Medication Sig Start Date End Date Taking? Authorizing Provider  acetaminophen (TYLENOL) 650 MG CR tablet Take 650 mg by mouth every 8 (eight) hours as needed for pain.   Yes Historical Provider, MD  B Complex Vitamins (VITAMIN-B COMPLEX PO) Take 50 mg by mouth daily.   Yes Historical Provider, MD  glipiZIDE (GLUCOTROL) 5 MG tablet Take 5 mg by mouth 2 (two) times daily before a meal.   Yes Historical Provider, MD  HYDROcodone-acetaminophen (NORCO/VICODIN) 5-325 MG tablet Take 2 tablets by mouth every 4 (four) hours as needed for moderate pain. 01/17/15  Yes [name],  MD  insulin glargine (LANTUS) 100 UNIT/ML injection Inject 10 Units into the skin at bedtime.   Yes Historical Provider, MD  lisinopril (PRINIVIL,ZESTRIL) 20 MG tablet Take 20 mg by mouth daily.   Yes Historical Provider, MD  metFORMIN (GLUCOPHAGE-XR) 750 MG 24 hr tablet Take 750 mg by mouth daily with breakfast.   Yes Historical Provider, MD  Misc Natural Products (COLON CLEANSE PO) Take 1 tablet by mouth daily as needed (constipation).   Yes  Historical Provider, MD  Multiple Vitamins-Minerals (MULTIVITAMIN WITH MINERALS) tablet Take 1 tablet by mouth daily. 09/08/13  Yes [name], NP  pravastatin (PRAVACHOL) 20 MG tablet Take 20 mg by mouth daily.   Yes Historical Provider, MD  rivaroxaban (XARELTO) 20 MG TABS tablet Take 1 tablet (20 mg total) by mouth daily with supper. 09/08/13  Yes [name], NP  tamsulosin (FLOMAX) 0.4 MG CAPS capsule Take 1 capsule (0.4 mg total) by mouth at bedtime. 09/08/13  Yes [name], NP  traMADol (ULTRAM) 50 MG tablet Take 50 mg by mouth every 6 (six) hours as needed for moderate pain.   Yes Historical Provider, MD   Triage Vitals: BP 106/70 mmHg  Pulse 59  Temp(Src) 97.3 F (36.3 C) (Oral)  Resp 19  Ht  (1.727 m)  Wt 207 lb (93.895 kg)  BMI 31.48 kg/m2  SpO2 96%   Physical Exam  Constitutional: He is oriented to person, place, and time. He appears well-developed and well-nourished. No distress.  HENT:  Head: Normocephalic and atraumatic.  Eyes: Conjunctivae and EOM are normal. Pupils are equal, round, and reactive to light.  Fundi are normal  Neck: Normal range of motion. Neck supple. No JVD present. Carotid bruit is not present.  Cardiovascular: Normal rate, regular rhythm and normal heart sounds.   No murmur heard. Pulmonary/Chest: Effort normal and breath sounds normal. He has no wheezes. He has no rales. He exhibits no tenderness.  Abdominal: Soft. Bowel sounds are normal. He exhibits no distension and no mass. There is no tenderness.  Musculoskeletal: Normal range of motion. He exhibits no edema.  Right shoulder tender in the anterior deltoid groove Rotator cuff impingement signs present  Lymphadenopathy:    He has no cervical adenopathy.  Neurological: He is alert and oriented to person, place, and time. He exhibits normal muscle tone. Coordination normal.  Skin: Skin is warm and dry. No rash noted.  Psychiatric: He has a normal mood and affect. His behavior is  normal. Judgment and thought content normal.  Nursing note and vitals reviewed.   ED Course  Procedures (including critical care time)  DIAGNOSTIC STUDIES: Oxygen Saturation is 96% on RA, normal by my interpretation.    COORDINATION OF CARE: 2:51 AM-Discussed treatment plan which includes Sodium chloride, Reglan, and Benadryl with pt at bedside and pt agreed to plan.   Labs Review Results for orders placed or performed during the hospital encounter of 01/19/15  Basic metabolic panel  Result Value Ref Range   Sodium 136 135 - 145 mmol/L   Potassium 3.9 3.5 - 5.1 mmol/L   Chloride 103 101 - 111 mmol/L   CO2 24 22 - 32 mmol/L   Glucose, Bld 182 (H) 65 - 99 mg/dL   BUN 13 6 - 20 mg/dL   Creatinine, Ser 1.61 0.61 - 1.24 mg/dL   Calcium 9.1 8.9 - 09.6 mg/dL   GFR calc non Af Amer >60 >60 mL/min   GFR calc Af Amer >60 >60 mL/min   Anion  gap 9 5 - 15  CBC  Result Value Ref Range   WBC 4.9 4.0 - 10.5 K/uL   RBC 4.49 4.22 - 5.81 MIL/uL   Hemoglobin 14.2 13.0 - 17.0 g/dL   HCT 16.140.7 09.639.0 - 04.552.0 %   MCV 90.6 78.0 - 100.0 fL   MCH 31.6 26.0 - 34.0 pg   MCHC 34.9 30.0 - 36.0 g/dL   RDW 40.912.6 81.111.5 - 91.415.5 %   Platelets 264 150 - 400 K/uL  Urinalysis, Routine w reflex microscopic (not [facility])  Result Value Ref Range   Color, Urine AMBER (A) YELLOW   APPearance CLEAR CLEAR   Specific Gravity, Urine 1.036 (H) 1.005 - 1.030   pH 5.0 5.0 - 8.0   Glucose, UA 100 (A) NEGATIVE mg/dL   Hgb urine dipstick NEGATIVE NEGATIVE   Bilirubin Urine NEGATIVE NEGATIVE   Ketones, ur NEGATIVE NEGATIVE mg/dL   Protein, ur NEGATIVE NEGATIVE mg/dL   Urobilinogen, UA 0.2 0.0 - 1.0 mg/dL   Nitrite NEGATIVE NEGATIVE   Leukocytes, UA NEGATIVE NEGATIVE  CBG monitoring, ED  Result Value Ref Range   Glucose-Capillary 179 (H) 65 - 99 mg/dL   Comment 1 Notify RN    Comment 2 Document in Chart   I-stat troponin, ED ([facility])  Result Value Ref Range   Troponin i, poc 0.00 0.00 - 0.08 ng/mL   Comment  3           Imaging Review Dg Chest 2 View  01/19/2015  CLINICAL DATA:  Chest pain.  History of DVT EXAM: CHEST  2 VIEW COMPARISON:  2 days ago FINDINGS: Lower volumes with questionable trace atelectasis at the left base. There is no edema, consolidation, effusion, or pneumothorax. Normal heart size and aortic contours. Negative visualized skeleton. IMPRESSION: Negative low volume chest. Electronically Signed   By: Marnee SpringJonathon  Watts M.D.   On: 01/19/2015 02:20   I have personally reviewed and evaluated these images and lab results as part of my medical decision-making.   EKG Interpretation   Date/Time:  Friday January 19 2015 01:42:27 EST Ventricular Rate:  58 PR Interval:  188 QRS Duration: 95 QT Interval:  395 QTC Calculation: 388 R Axis:   17 Text Interpretation:  Sinus rhythm Abnormal R-wave progression, early  transition Borderline T wave abnormalities When compared with ECG of  01/17/2015, T wave inversion T wave inversion less evident in Inferior  leads and Anterolateral leads Confirmed by [facility]  MD, Donnell Wion ([id number]) on  01/19/2015 1:56:58 AM      MDM   Final diagnoses:  Syncope and collapse  Migraine without status migrainosus, not intractable, unspecified migraine type  Rotator cuff impingement syndrome, right    Episode of dizziness with syncope. No red flags to suggest serious condition. Now has headache which has characteristics suggestive of migraine. Shoulder pain she is radiates to the chest and seems to be rotator cuff impingement syndrome. Old records are reviewed and he was seen in emergency on several days ago with diagnosis of right shoulder bursitis. Orthostatic vital signs show no significant change in heart rate or blood pressure. Screening labs are unremarkable. ECG fracture shows improvement and T wave changes seen on prior ECG. He was given a migraine cocktail of metoclopramide, diphenhydramine, and normal saline following which her headache was completely  resolved. He is advised to talk with his PCP about orthopedic referral regarding his shoulder pain. He is not a candidate for NSAIDs because of systemic anticoagulation.  I personally performed the services described in this documentation, which was scribed in my presence. The recorded information has been reviewed and is accurate.       Dione Booze, MD 01/19/15 [phone]

## 2015-01-19 NOTE — ED Notes (Signed)
Dr. Glick at the bedside.  

## 2015-01-19 NOTE — ED Notes (Signed)
Pt pocket knife held at security.

## 2015-01-19 NOTE — Discharge Instructions (Signed)
Talk with your doctor about getting a referral to an orthopedic specialist regarding your shoulder.   Migraine Headache A migraine headache is an intense, throbbing pain on one or both sides of your head. A migraine can last for 30 minutes to several hours. CAUSES  The exact cause of a migraine headache is not always known. However, a migraine may be caused when nerves in the brain become irritated and release chemicals that cause inflammation. This causes pain. Certain things may also trigger migraines, such as:  Alcohol.  Smoking.  Stress.  Menstruation.  Aged cheeses.  Foods or drinks that contain nitrates, glutamate, aspartame, or tyramine.  Lack of sleep.  Chocolate.  Caffeine.  Hunger.  Physical exertion.  Fatigue.  Medicines used to treat chest pain (nitroglycerine), birth control pills, estrogen, and some blood pressure medicines. SIGNS AND SYMPTOMS  Pain on one or both sides of your head.  Pulsating or throbbing pain.  Severe pain that prevents daily activities.  Pain that is aggravated by any physical activity.  Nausea, vomiting, or both.  Dizziness.  Pain with exposure to bright lights, loud noises, or activity.  General sensitivity to bright lights, loud noises, or smells. Before you get a migraine, you may get warning signs that a migraine is coming (aura). An aura may include:  Seeing flashing lights.  Seeing bright spots, halos, or zigzag lines.  Having tunnel vision or blurred vision.  Having feelings of numbness or tingling.  Having trouble talking.  Having muscle weakness. DIAGNOSIS  A migraine headache is often diagnosed based on:  Symptoms.  Physical exam.  A CT scan or MRI of your head. These imaging tests cannot diagnose migraines, but they can help rule out other causes of headaches. TREATMENT Medicines may be given for pain and nausea. Medicines can also be given to help prevent recurrent migraines.  HOME CARE  INSTRUCTIONS  Only take over-the-counter or prescription medicines for pain or discomfort as directed by your health care provider. The use of long-term narcotics is not recommended.  Lie down in a dark, quiet room when you have a migraine.  Keep a journal to find out what may trigger your migraine headaches. For example, write down:  What you eat and drink.  How much sleep you get.  Any change to your diet or medicines.  Limit alcohol consumption.  Quit smoking if you smoke.  Get 7-9 hours of sleep, or as recommended by your health care provider.  Limit stress.  Keep lights dim if bright lights bother you and make your migraines worse. SEEK IMMEDIATE MEDICAL CARE IF:   Your migraine becomes severe.  You have a fever.  You have a stiff neck.  You have vision loss.  You have muscular weakness or loss of muscle control.  You start losing your balance or have trouble walking.  You feel faint or pass out.  You have severe symptoms that are different from your first symptoms. MAKE SURE YOU:   Understand these instructions.  Will watch your condition.  Will get help right away if you are not doing well or get worse.   This information is not intended to replace advice given to you by your health care provider. Make sure you discuss any questions you have with your health care provider.   Document Released: 02/24/2005 Document Revised: 03/17/2014 Document Reviewed: 11/01/2012 Elsevier Interactive Patient Education 2016 [employer].  Syncope Syncope is a medical term for fainting or passing out. This means you lose consciousness and  drop to the ground. People are generally unconscious for less than 5 minutes. You may have some muscle twitches for up to 15 seconds before waking up and returning to normal. Syncope occurs more often in older adults, but it can happen to anyone. While most causes of syncope are not dangerous, syncope can be a sign of a serious medical  problem. It is important to seek medical care.  CAUSES  Syncope is caused by a sudden drop in blood flow to the brain. The specific cause is often not determined. Factors that can bring on syncope include:  Taking medicines that lower blood pressure.  Sudden changes in posture, such as standing up quickly.  Taking more medicine than prescribed.  Standing in one place for too long.  Seizure disorders.  Dehydration and excessive exposure to heat.  Low blood sugar (hypoglycemia).  Straining to have a bowel movement.  Heart disease, irregular heartbeat, or other circulatory problems.  Fear, emotional distress, seeing blood, or severe pain. SYMPTOMS  Right before fainting, you may:  Feel dizzy or light-headed.  Feel nauseous.  See all white or all black in your field of vision.  Have cold, clammy skin. DIAGNOSIS  Your health care provider will ask about your symptoms, perform a physical exam, and perform an electrocardiogram (ECG) to record the electrical activity of your heart. Your health care provider may also perform other heart or blood tests to determine the cause of your syncope which may include:  Transthoracic echocardiogram (TTE). During echocardiography, sound waves are used to evaluate how blood flows through your heart.  Transesophageal echocardiogram (TEE).  Cardiac monitoring. This allows your health care provider to monitor your heart rate and rhythm in real time.  Holter monitor. This is a portable device that records your heartbeat and can help diagnose heart arrhythmias. It allows your health care provider to track your heart activity for several days, if needed.  Stress tests by exercise or by giving medicine that makes the heart beat faster. TREATMENT  In most cases, no treatment is needed. Depending on the cause of your syncope, your health care provider may recommend changing or stopping some of your medicines. HOME CARE INSTRUCTIONS  Have someone  stay with you until you feel stable.  Do not drive, use machinery, or play sports until your health care provider says it is okay.  Keep all follow-up appointments as directed by your health care provider.  Lie down right away if you start feeling like you might faint. Breathe deeply and steadily. Wait until all the symptoms have passed.  Drink enough fluids to keep your urine clear or pale yellow.  If you are taking blood pressure or heart medicine, get up slowly and take several minutes to sit and then stand. This can reduce dizziness. SEEK IMMEDIATE MEDICAL CARE IF:   You have a severe headache.  You have unusual pain in the chest, abdomen, or back.  You are bleeding from your mouth or rectum, or you have black or tarry stool.  You have an irregular or very fast heartbeat.  You have pain with breathing.  You have repeated fainting or seizure-like jerking during an episode.  You faint when sitting or lying down.  You have confusion.  You have trouble walking.  You have severe weakness.  You have vision problems. If you fainted, call your local emergency services (911 in [place] Do not drive yourself to the hospital.    This information is not intended to replace advice given  to you by your health care provider. Make sure you discuss any questions you have with your health care provider.   Document Released: 02/24/2005 Document Revised: 07/11/2014 Document Reviewed: 04/25/2011 Elsevier Interactive Patient Education 2016 Else[employer] Inc[employer]  Rotator Cuff Tendinitis Rotator cuff tendinitis is inflammation of the tough, cord-like bands that connect muscle to bone (tendons) in your rotator cuff. Your rotator cuff is the collection of all the muscles and tendons that connect your arm to your shoulder. Your rotator cuff holds the head of your upper arm bone (humerus) in the cup (fossa) of your shoulder blade (scapula). CAUSES Rotator cuff tendinitis is usually caused by overusing the  joint involved.  SIGNS AND SYMPTOMS  Deep ache in the shoulder also felt on the outside upper arm over the shoulder muscle.  Point tenderness over the area that is injured.  Pain comes on gradually and becomes worse with lifting the arm to the side (abduction) or turning it inward (internal rotation).  May lead to a chronic tear: When a rotator cuff tendon becomes inflamed, it runs the risk of losing its blood supply, causing some tendon fibers to die. This increases the risk that the tendon can fray and partially or completely tear. DIAGNOSIS Rotator cuff tendinitis is diagnosed by taking a medical history, performing a physical exam, and reviewing results of imaging exams. The medical history is useful to help determine the type of rotator cuff injury. The physical exam will include looking at the injured shoulder, feeling the injured area, and watching you do range-of-motion exercises. X-ray exams are typically done to rule out other causes of shoulder pain, such as fractures. MRI is the imaging exam usually used for significant shoulder injuries. Sometimes a dye study called CT arthrogram is done, but it is not as widely used as MRI. In some institutions, special ultrasound tests may also be used to aid in the diagnosis. TREATMENT  Less Severe Cases  Use of a sling to rest the shoulder for a short period of time. Prolonged use of the sling can cause stiffness, weakness, and loss of motion of the shoulder joint.  Anti-inflammatory medicines, such as ibuprofen or naproxen sodium, may be prescribed. More Severe Cases  Physical therapy.  Use of steroid injections into the shoulder joint.  Surgery. HOME CARE INSTRUCTIONS   Use a sling or splint until the pain decreases. Prolonged use of the sling can cause stiffness, weakness, and loss of motion of the shoulder joint.  Apply ice to the injured area:  Put ice in a plastic bag.  Place a towel between your skin and the bag.  Leave the  ice on for 20 minutes, 2-3 times a day.  Try to avoid use other than gentle range of motion while your shoulder is painful. Use the shoulder and exercise only as directed by your health care provider. Stop exercises or range of motion if pain or discomfort increases, unless directed otherwise by your health care provider.  Only take over-the-counter or prescription medicines for pain, discomfort, or fever as directed by your health care provider.  If you were given a shoulder sling and straps (immobilizer), do not remove it except as directed, or until you see a health care provider for a follow-up exam. If you need to remove it, move your arm as little as possible or as directed.  You may want to sleep on several pillows at night to lessen swelling and pain. SEEK IMMEDIATE MEDICAL CARE IF:   Your shoulder pain increases or  new pain develops in your arm, hand, or fingers and is not relieved with medicines.  You have new, unexplained symptoms, especially increased numbness in the hands or loss of strength.  You develop any worsening of the problems that brought you in for care.  Your arm, hand, or fingers are numb or tingling.  Your arm, hand, or fingers are swollen, painful, or turn white or blue. MAKE SURE YOU:  Understand these instructions.  Will watch your condition.  Will get help right away if you are not doing well or get worse.   This information is not intended to replace advice given to you by your health care provider. Make sure you discuss any questions you have with your health care provider.   Document Released: 05/17/2003 Document Revised: 03/17/2014 Document Reviewed: 10/06/2012 Elsevier Interactive Patient Education [employer]

## 2015-01-19 NOTE — ED Notes (Signed)
Pt arrived via EMS c/c of syncope with LOC and right upper chest pain radiating to back and into right arm. Per EMS pt was not a fall with the syncope. Per EMS pt was diaphoretic on arrival. Pt was given 1 of Nitro, unable to take aspirin, and 100mg  Phentynel IV before arrival. Pt has 18G in left forearm that is patent. Pt complains of pain in right arm and shoulder with flexion and extension. Pt states nitro made his pain worse. Pt has hx of DVT, is diabetic with hypertension. Pt currently taking xarelto. Vital signs stable.

## 2015-01-24 ENCOUNTER — Emergency Department (HOSPITAL_COMMUNITY)
Admission: EM | Admit: 2015-01-24 | Discharge: 2015-01-25 | Disposition: A | Discharge status: Home / Self Care | Payer: Non-veteran care | Attending: Emergency Medicine | Admitting: Emergency Medicine

## 2015-01-24 ENCOUNTER — Emergency Department (HOSPITAL_COMMUNITY): Payer: Non-veteran care

## 2015-01-24 ENCOUNTER — Encounter (HOSPITAL_COMMUNITY): Payer: Self-pay | Admitting: Emergency Medicine

## 2015-01-24 DIAGNOSIS — Z87891 Personal history of nicotine dependence: Secondary | ICD-10-CM | POA: Diagnosis not present

## 2015-01-24 DIAGNOSIS — Z86718 Personal history of other venous thrombosis and embolism: Secondary | ICD-10-CM | POA: Diagnosis not present

## 2015-01-24 DIAGNOSIS — Z9119 Patient's noncompliance with other medical treatment and regimen: Secondary | ICD-10-CM | POA: Diagnosis not present

## 2015-01-24 DIAGNOSIS — R51 Headache: Secondary | ICD-10-CM | POA: Insufficient documentation

## 2015-01-24 DIAGNOSIS — R0602 Shortness of breath: Secondary | ICD-10-CM | POA: Diagnosis not present

## 2015-01-24 DIAGNOSIS — Z79899 Other long term (current) drug therapy: Secondary | ICD-10-CM | POA: Diagnosis not present

## 2015-01-24 DIAGNOSIS — F329 Major depressive disorder, single episode, unspecified: Secondary | ICD-10-CM | POA: Insufficient documentation

## 2015-01-24 DIAGNOSIS — Z794 Long term (current) use of insulin: Secondary | ICD-10-CM | POA: Diagnosis not present

## 2015-01-24 DIAGNOSIS — I10 Essential (primary) hypertension: Secondary | ICD-10-CM | POA: Diagnosis not present

## 2015-01-24 DIAGNOSIS — R519 Headache, unspecified: Secondary | ICD-10-CM

## 2015-01-24 DIAGNOSIS — R079 Chest pain, unspecified: Secondary | ICD-10-CM

## 2015-01-24 NOTE — ED Provider Notes (Signed)
CSN: [id number]     Arrival date & time 01/24/15  1811 History  By signing my name below, I, [name], attest that this documentation has been prepared under the direction and in the presence of [name], MD. Electronically Signed: [name], ED Scribe. 01/24/2015. 11:12 PM.  Chief Complaint  Patient presents with  . Headache  . Shortness of Breath   The history is provided by the patient. No language interpreter was used.     HPI Comments: [name] is a 49 y.o. male who presents to the Emergency Department complaining of gradual onset, constant, occiput headache x 6 days. Pt also complains of nasal congestion, mild cough, shortness of breath, and a a ringing sensation in his bilateral ears that began around the same time as the headache. He notes that when he exerts himself he begins to have chest pain radiating in between his shoulder blades and his headache is exacerbated. Pt states he was seen by his PCP and was given amoxicillin for a sinus infection. Denies rhinorrhea, fever, chills, or any other associated symptoms. Significant other notes that pt fell a couple of weeks ago and hit his head but has not hit his head in the past week or so. Pt is being complaint with his anticoagulants.    Past Medical History  Diagnosis Date  . DVT (deep venous thrombosis) (HCC) 01/08/2011    lt leg  . DVT of lower limb, acute (HCC) 01/12/2011  . BPH (benign prostatic hyperplasia)   . Current use of long term anticoagulation   . 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 Noncompliance w/medication treatment due to intermit use of medication   . History of tobacco use     QUIT early 2013  . Hypertension   . Depression   . Enlarged prostate    Past Surgical History  Procedure Laterality Date  . Tonsillectomy    . Bilateral upper extremity angiogram N/A 07/23/2011    Procedure: BILATERAL UPPER EXTREMITY ANGIOGRAM;   Surgeon: Fransisco Hertz, MD;  Location: [facility] CATH LAB;  Service: Cardiovascular;  Laterality: N/A;   No family history on file. Social History  Substance Use Topics  . Smoking status: Former Smoker    Quit date: 07/18/2013  . Smokeless tobacco: Never Used  . Alcohol Use: No     Comment: no alcohol since 09/01/13    Review of Systems  Constitutional: Negative for fever and chills.  HENT: Positive for congestion. Negative for rhinorrhea.        + Ringing in ears  Respiratory: Positive for cough and shortness of breath.   Cardiovascular: Positive for chest pain.  Neurological: Positive for headaches.  All other systems reviewed and are negative.  Allergies  Review of patient's allergies indicates no known allergies.  Home Medications   Prior to Admission medications   Medication Sig Start Date End Date Taking? Authorizing Provider  acetaminophen (TYLENOL) 650 MG CR tablet Take 650 mg by mouth every 8 (eight) hours as needed for pain.   Yes Historical Provider, MD  B Complex Vitamins (VITAMIN-B COMPLEX PO) Take 50 mg by mouth daily.   Yes Historical Provider, MD  glipiZIDE (GLUCOTROL) 5 MG tablet Take 5 mg by mouth 2 (two) times daily before a meal.   Yes Historical Provider, MD  insulin glargine (LANTUS) 100 UNIT/ML injection Inject 10 Units into the skin at bedtime.   Yes Historical Provider, MD  lisinopril (  PRINIVIL,ZESTRIL) 20 MG tablet Take 20 mg by mouth daily.   Yes Historical Provider, MD  loratadine-pseudoephedrine (CLARITIN-D 24-HOUR) 10-240 MG 24 hr tablet Take 1 tablet by mouth at bedtime.   Yes Historical Provider, MD  metFORMIN (GLUCOPHAGE-XR) 750 MG 24 hr tablet Take 750 mg by mouth daily with breakfast.   Yes Historical Provider, MD  Misc Natural Products (COLON CLEANSE PO) Take 1 tablet by mouth daily as needed (constipation).   Yes Historical Provider, MD  Multiple Vitamins-Minerals (MULTIVITAMIN WITH MINERALS) tablet Take 1 tablet by mouth daily. 09/08/13  Yes [name], NP  pravastatin (PRAVACHOL) 20 MG tablet Take 20 mg by mouth at bedtime.    Yes Historical Provider, MD  rivaroxaban (XARELTO) 20 MG TABS tablet Take 1 tablet (20 mg total) by mouth daily with supper. Patient taking differently: Take 20 mg by mouth daily.  09/08/13  Yes [name], NP  tamsulosin (FLOMAX) 0.4 MG CAPS capsule Take 1 capsule (0.4 mg total) by mouth at bedtime. 09/08/13  Yes [name], NP  traMADol (ULTRAM) 50 MG tablet Take 50 mg by mouth every 6 (six) hours as needed for moderate pain.   Yes Historical Provider, MD  HYDROcodone-acetaminophen (NORCO/VICODIN) 5-325 MG tablet Take 2 tablets by mouth every 4 (four) hours as needed for moderate pain. Patient not taking: Reported on 01/24/2015 01/17/15   [name], MD   Triage Vitals: BP 130/90 mmHg  Pulse 73  Temp(Src) 97.9 F (36.6 C) (Oral)  Resp 18  SpO2 96%   Physical Exam  Constitutional: He is oriented to person, place, and time. He appears well-developed and well-nourished. No distress.  HENT:  Head: Normocephalic and atraumatic.  Right Ear: Hearing and tympanic membrane normal.  Left Ear: Hearing and tympanic membrane normal.  Nose: Nose normal.  Mouth/Throat: Oropharynx is clear and moist and mucous membranes are normal.  Eyes: Conjunctivae and EOM are normal. Pupils are equal, round, and reactive to light.  Neck: Normal range of motion. Neck supple.  Cardiovascular: Regular rhythm, S1 normal and S2 normal.  Exam reveals no gallop and no friction rub.   No murmur heard. Pulmonary/Chest: Effort normal and breath sounds normal. No respiratory distress. He exhibits no tenderness.  Abdominal: Soft. Normal appearance and bowel sounds are normal. There is no hepatosplenomegaly. There is no tenderness. There is no rebound, no guarding, no tenderness at McBurney's point and negative Murphy's sign. No hernia.  Musculoskeletal: Normal range of motion.  Neurological: He is alert and oriented to person,  place, and time. He has normal strength. No cranial nerve deficit or sensory deficit. Coordination normal. GCS eye subscore is 4. GCS verbal subscore is 5. GCS motor subscore is 6.  Extraocular muscle movement: normal No visual field cut Pupils: equal and reactive both direct and consensual response is normal No nystagmus present    Sensory function is intact to light touch, pinprick Proprioception intact  Grip strength 5/5 symmetric in upper extremities No pronator drift Normal finger to nose bilaterally  Lower extremity strength 5/5 against gravity Normal heel to shin bilaterally  Gait: normal   Skin: Skin is warm, dry and intact. No rash noted. No cyanosis.  Psychiatric: He has a normal mood and affect. His speech is normal and behavior is normal. Thought content normal.  Nursing note and vitals reviewed.   ED Course  Procedures (including critical care time)  DIAGNOSTIC STUDIES: Oxygen Saturation is 96% on RA, normal by my interpretation.    COORDINATION OF CARE: 11:09  PM-Discussed treatment plan which includes CT Head, CBC, BMP, Troponin, and BNP with pt at bedside and pt agreed to plan.   Labs Review Labs Reviewed - No data to display  Imaging Review Dg Chest 2 View  01/24/2015  CLINICAL DATA:  Shortness of breath. EXAM: CHEST  2 VIEW COMPARISON:  January 19, 2015. FINDINGS: The heart size and mediastinal contours are within normal limits. Both lungs are clear. No pneumothorax or pleural effusion is noted. The visualized skeletal structures are unremarkable. IMPRESSION: No active cardiopulmonary disease. Electronically Signed   By: Lupita RaiderJames  Green Jr, M.D.   On: 01/24/2015 19:09   I have personally reviewed and evaluated these images and lab results as part of my medical decision-making.   EKG Interpretation None      ED ECG REPORT   Date: 01/24/2015  Rate: 92  Rhythm: normal sinus rhythm  QRS Axis: normal  Intervals: normal  ST/T Wave abnormalities:  nonspecific T wave changes  Conduction Disutrbances:none  Narrative Interpretation:   Old EKG Reviewed: unchanged  I have personally reviewed the EKG tracing and agree with the computerized printout as noted.   MDM   Final diagnoses:  None   headache  Chest pain   Presents to the ER for evaluation of multiple problems. He is complaining of headache and ringing in his ears. Headache is cold: Goes down to the back of his neck. There is no neck stiffness or sign of meningismus. Patient is afebrile. He has a normal neurologic exam. CT head performed because patient is on Xarelto. CT negative. EKG unchanged from previous. Troponin negative. BNP negative. Chest x-ray clear, no evidence of pneumonia or congestive heart failure. Patient did complain of shortness of breath and dyspnea on exertion. He is not hypoxic. He is not tachycardic. He assures me that he has not missed any doses of his Xarelto, PE is not suspected. HEART socre = 3 (low risk). Because of his dyspnea on exertion (also reports that he has had some discomfort in the chest when he exerts himself) will refer to cardiology for further evaluation. Return to the ER for any continuous chest pain.  I personally performed the services described in this documentation, which was scribed in my presence. The recorded information has been reviewed and is accurate.       Gilda Creasehristopher J Pollina, MD 01/25/15 [phone]

## 2015-01-24 NOTE — ED Notes (Signed)
Patient transported to CT 

## 2015-01-24 NOTE — ED Notes (Signed)
Pt reports headache x 6 days. Pt also reports SOB along with the headache. Pt alert x4. NAD at this time.

## 2015-01-24 NOTE — ED Notes (Signed)
MD at bedside. 

## 2015-01-25 LAB — CBC WITH DIFFERENTIAL/PLATELET
BASOS PCT: 0 %
Basophils Absolute: 0 10*3/uL (ref 0.0–0.1)
EOS ABS: 0.1 10*3/uL (ref 0.0–0.7)
EOS PCT: 1 %
HCT: 44.6 % (ref 39.0–52.0)
HEMOGLOBIN: 15.5 g/dL (ref 13.0–17.0)
LYMPHS ABS: 3.2 10*3/uL (ref 0.7–4.0)
Lymphocytes Relative: 59 %
MCH: 31.6 pg (ref 26.0–34.0)
MCHC: 34.8 g/dL (ref 30.0–36.0)
MCV: 91 fL (ref 78.0–100.0)
Monocytes Absolute: 0.4 10*3/uL (ref 0.1–1.0)
Monocytes Relative: 6 %
NEUTROS PCT: 34 %
Neutro Abs: 1.9 10*3/uL (ref 1.7–7.7)
PLATELETS: 265 10*3/uL (ref 150–400)
RBC: 4.9 MIL/uL (ref 4.22–5.81)
RDW: 12.8 % (ref 11.5–15.5)
WBC: 5.5 10*3/uL (ref 4.0–10.5)

## 2015-01-25 LAB — BASIC METABOLIC PANEL
Anion gap: 8 (ref 5–15)
BUN: 18 mg/dL (ref 6–20)
CO2: 24 mmol/L (ref 22–32)
CREATININE: 1.03 mg/dL (ref 0.61–1.24)
Calcium: 9.4 mg/dL (ref 8.9–10.3)
Chloride: 102 mmol/L (ref 101–111)
Glucose, Bld: 191 mg/dL — ABNORMAL HIGH (ref 65–99)
POTASSIUM: 4 mmol/L (ref 3.5–5.1)
SODIUM: 134 mmol/L — AB (ref 135–145)

## 2015-01-25 LAB — TROPONIN I

## 2015-01-25 LAB — BRAIN NATRIURETIC PEPTIDE: B NATRIURETIC PEPTIDE 5: 5.6 pg/mL (ref 0.0–100.0)

## 2015-01-25 MED ORDER — FLUTICASONE PROPIONATE 50 MCG/ACT NA SUSP
1.0000 | Freq: Every day | NASAL | Status: DC
Start: 2015-01-25 — End: 2015-10-10

## 2015-01-25 NOTE — Discharge Instructions (Signed)
General Headache Without Cause °A headache is pain or discomfort felt around the head or neck area. The specific cause of a headache may not be found. There are many causes and types of headaches. A few common ones are: °· Tension headaches. °· Migraine headaches. °· Cluster headaches. °· Chronic daily headaches. °HOME CARE INSTRUCTIONS  °Watch your condition for any changes. Take these steps to help with your condition: °Managing Pain °· Take over-the-counter and prescription medicines only as told by your health care provider. °· Lie down in a dark, quiet room when you have a headache. °· If directed, apply ice to the head and neck area: °· Put ice in a plastic bag. °· Place a towel between your skin and the bag. °· Leave the ice on for 20 minutes, 2-3 times per day. °· Use a heating pad or hot shower to apply heat to the head and neck area as told by your health care provider. °· Keep lights dim if bright lights bother you or make your headaches worse. °Eating and Drinking °· Eat meals on a regular schedule. °· Limit alcohol use. °· Decrease the amount of caffeine you drink, or stop drinking caffeine. °General Instructions °· Keep all follow-up visits as told by your health care provider. This is important. °· Keep a headache journal to help find out what may trigger your headaches. For example, write down: °· What you eat and drink. °· How much sleep you get. °· Any change to your diet or medicines. °· Try massage or other relaxation techniques. °· Limit stress. °· Sit up straight, and do not tense your muscles. °· Do not use tobacco products, including cigarettes, chewing tobacco, or e-cigarettes. If you need help quitting, ask your health care provider. °· Exercise regularly as told by your health care provider. °· Sleep on a regular schedule. Get 7-9 hours of sleep, or the amount recommended by your health care provider. °SEEK MEDICAL CARE IF:  °· Your symptoms are not helped by medicine. °· You have a  headache that is different from the usual headache. °· You have nausea or you vomit. °· You have a fever. °SEEK IMMEDIATE MEDICAL CARE IF:  °· Your headache becomes severe. °· You have repeated vomiting. °· You have a stiff neck. °· You have a loss of vision. °· You have problems with speech. °· You have pain in the eye or ear. °· You have muscular weakness or loss of muscle control. °· You lose your balance or have trouble walking. °· You feel faint or pass out. °· You have confusion. °  °This information is not intended to replace advice given to you by your health care provider. Make sure you discuss any questions you have with your health care provider. °  °Document Released: 02/24/2005 Document Revised: 11/15/2014 Document Reviewed: 06/19/2014 °Elsevier Interactive Patient Education ©2016 [employer] ° °Nonspecific Chest Pain  °Chest pain can be caused by many different conditions. There is always a chance that your pain could be related to something serious, such as a heart attack or a blood clot in your lungs. Chest pain can also be caused by conditions that are not life-threatening. If you have chest pain, it is very important to follow up with your health care provider. °CAUSES  °Chest pain can be caused by: °· Heartburn. °· Pneumonia or bronchitis. °· Anxiety or stress. °· Inflammation around your heart (pericarditis) or lung (pleuritis or pleurisy). °· A blood clot in your lung. °· A collapsed   lung (pneumothorax). It can develop suddenly on its own (spontaneous pneumothorax) or from trauma to the chest. °· Shingles infection (varicella-zoster virus). °· Heart attack. °· Damage to the bones, muscles, and cartilage that make up your chest wall. This can include: °¨ Bruised bones due to injury. °¨ Strained muscles or cartilage due to frequent or repeated coughing or overwork. °¨ Fracture to one or more ribs. °¨ Sore cartilage due to inflammation (costochondritis). °RISK FACTORS  °Risk factors for chest  pain may include: °· Activities that increase your risk for trauma or injury to your chest. °· Respiratory infections or conditions that cause frequent coughing. °· Medical conditions or overeating that can cause heartburn. °· Heart disease or family history of heart disease. °· Conditions or health behaviors that increase your risk of developing a blood clot. °· Having had chicken pox (varicella zoster). °SIGNS AND SYMPTOMS °Chest pain can feel like: °· Burning or tingling on the surface of your chest or deep in your chest. °· Crushing, pressure, aching, or squeezing pain. °· Dull or sharp pain that is worse when you move, cough, or take a deep breath. °· Pain that is also felt in your back, neck, shoulder, or arm, or pain that spreads to any of these areas. °Your chest pain may come and go, or it may stay constant. °DIAGNOSIS °Lab tests or other studies may be needed to find the cause of your pain. Your health care provider may have you take a test called an ambulatory ECG (electrocardiogram). An ECG records your heartbeat patterns at the time the test is performed. You may also have other tests, such as: °· Transthoracic echocardiogram (TTE). During echocardiography, sound waves are used to create a picture of all of the heart structures and to look at how blood flows through your heart. °· Transesophageal echocardiogram (TEE). This is a more advanced imaging test that obtains images from inside your body. It allows your health care provider to see your heart in finer detail. °· Cardiac monitoring. This allows your health care provider to monitor your heart rate and rhythm in real time. °· Holter monitor. This is a portable device that records your heartbeat and can help to diagnose abnormal heartbeats. It allows your health care provider to track your heart activity for several days, if needed. °· Stress tests. These can be done through exercise or by taking medicine that makes your heart beat more  quickly. °· Blood tests. °· Imaging tests. °TREATMENT  °Your treatment depends on what is causing your chest pain. Treatment may include: °· Medicines. These may include: °¨ Acid blockers for heartburn. °¨ Anti-inflammatory medicine. °¨ Pain medicine for inflammatory conditions. °¨ Antibiotic medicine, if an infection is present. °¨ Medicines to dissolve blood clots. °¨ Medicines to treat coronary artery disease. °· Supportive care for conditions that do not require medicines. This may include: °¨ Resting. °¨ Applying heat or cold packs to injured areas. °¨ Limiting activities until pain decreases. °HOME CARE INSTRUCTIONS °· If you were prescribed an antibiotic medicine, finish it all even if you start to feel better. °· Avoid any activities that bring on chest pain. °· Do not use any tobacco products, including cigarettes, chewing tobacco, or electronic cigarettes. If you need help quitting, ask your health care provider. °· Do not drink alcohol. °· Take medicines only as directed by your health care provider. °· Keep all follow-up visits as directed by your health care provider. This is important. This includes any further testing if your chest pain   does not go away. °· If heartburn is the cause for your chest pain, you may be told to keep your head raised (elevated) while sleeping. This reduces the chance that acid will go from your stomach into your esophagus. °· Make lifestyle changes as directed by your health care provider. These may include: °¨ Getting regular exercise. Ask your health care provider to suggest some activities that are safe for you. °¨ Eating a heart-healthy diet. A registered dietitian can help you to learn healthy eating options. °¨ Maintaining a healthy weight. °¨ Managing diabetes, if necessary. °¨ Reducing stress. °SEEK MEDICAL CARE IF: °· Your chest pain does not go away after treatment. °· You have a rash with blisters on your chest. °· You have a fever. °SEEK IMMEDIATE MEDICAL CARE  IF:  °· Your chest pain is worse. °· You have an increasing cough, or you cough up blood. °· You have severe abdominal pain. °· You have severe weakness. °· You faint. °· You have chills. °· You have sudden, unexplained chest discomfort. °· You have sudden, unexplained discomfort in your arms, back, neck, or jaw. °· You have shortness of breath at any time. °· You suddenly start to sweat, or your skin gets clammy. °· You feel nauseous or you vomit. °· You suddenly feel light-headed or dizzy. °· Your heart begins to beat quickly, or it feels like it is skipping beats. °These symptoms may represent a serious problem that is an emergency. Do not wait to see if the symptoms will go away. Get medical help right away. Call your local emergency services (911 in the [place] Do not drive yourself to the hospital. °  °This information is not intended to replace advice given to you by your health care provider. Make sure you discuss any questions you have with your health care provider. °  °Document Released: 12/04/2004 Document Revised: 03/17/2014 Document Reviewed: 09/30/2013 °Elsevier Interactive Patient Education ©2016 [employer] Inc[employer] ° °

## 2015-04-08 ENCOUNTER — Emergency Department (HOSPITAL_COMMUNITY)
Admission: EM | Admit: 2015-04-08 | Discharge: 2015-04-08 | Disposition: A | Discharge status: Home / Self Care | Payer: Non-veteran care | Attending: Emergency Medicine | Admitting: Emergency Medicine

## 2015-04-08 ENCOUNTER — Emergency Department (HOSPITAL_COMMUNITY): Payer: Non-veteran care

## 2015-04-08 ENCOUNTER — Encounter (HOSPITAL_COMMUNITY): Payer: Self-pay | Admitting: Emergency Medicine

## 2015-04-08 DIAGNOSIS — I10 Essential (primary) hypertension: Secondary | ICD-10-CM | POA: Insufficient documentation

## 2015-04-08 DIAGNOSIS — Z79899 Other long term (current) drug therapy: Secondary | ICD-10-CM | POA: Insufficient documentation

## 2015-04-08 DIAGNOSIS — Z86718 Personal history of other venous thrombosis and embolism: Secondary | ICD-10-CM | POA: Insufficient documentation

## 2015-04-08 DIAGNOSIS — N400 Enlarged prostate without lower urinary tract symptoms: Secondary | ICD-10-CM | POA: Insufficient documentation

## 2015-04-08 DIAGNOSIS — Z7901 Long term (current) use of anticoagulants: Secondary | ICD-10-CM | POA: Insufficient documentation

## 2015-04-08 DIAGNOSIS — Z794 Long term (current) use of insulin: Secondary | ICD-10-CM | POA: Insufficient documentation

## 2015-04-08 DIAGNOSIS — J069 Acute upper respiratory infection, unspecified: Secondary | ICD-10-CM | POA: Insufficient documentation

## 2015-04-08 DIAGNOSIS — F329 Major depressive disorder, single episode, unspecified: Secondary | ICD-10-CM | POA: Insufficient documentation

## 2015-04-08 DIAGNOSIS — Z7984 Long term (current) use of oral hypoglycemic drugs: Secondary | ICD-10-CM | POA: Insufficient documentation

## 2015-04-08 DIAGNOSIS — R600 Localized edema: Secondary | ICD-10-CM | POA: Insufficient documentation

## 2015-04-08 DIAGNOSIS — Z87891 Personal history of nicotine dependence: Secondary | ICD-10-CM | POA: Insufficient documentation

## 2015-04-08 DIAGNOSIS — B9789 Other viral agents as the cause of diseases classified elsewhere: Secondary | ICD-10-CM

## 2015-04-08 DIAGNOSIS — Z7951 Long term (current) use of inhaled steroids: Secondary | ICD-10-CM | POA: Insufficient documentation

## 2015-04-08 LAB — CBC
HEMATOCRIT: 44.3 % (ref 39.0–52.0)
HEMOGLOBIN: 15.7 g/dL (ref 13.0–17.0)
MCH: 32 pg (ref 26.0–34.0)
MCHC: 35.4 g/dL (ref 30.0–36.0)
MCV: 90.2 fL (ref 78.0–100.0)
Platelets: 236 10*3/uL (ref 150–400)
RBC: 4.91 MIL/uL (ref 4.22–5.81)
RDW: 12.7 % (ref 11.5–15.5)
WBC: 5.8 10*3/uL (ref 4.0–10.5)

## 2015-04-08 LAB — BASIC METABOLIC PANEL
ANION GAP: 12 (ref 5–15)
BUN: 17 mg/dL (ref 6–20)
CO2: 22 mmol/L (ref 22–32)
Calcium: 9.6 mg/dL (ref 8.9–10.3)
Chloride: 99 mmol/L — ABNORMAL LOW (ref 101–111)
Creatinine, Ser: 1.11 mg/dL (ref 0.61–1.24)
GLUCOSE: 355 mg/dL — AB (ref 65–99)
POTASSIUM: 4.3 mmol/L (ref 3.5–5.1)
SODIUM: 133 mmol/L — AB (ref 135–145)

## 2015-04-08 MED ORDER — IPRATROPIUM-ALBUTEROL 0.5-2.5 (3) MG/3ML IN SOLN
3.0000 mL | RESPIRATORY_TRACT | Status: DC
  Administered 2015-04-08: 3 mL via RESPIRATORY_TRACT
  Filled 2015-04-08: qty 3

## 2015-04-08 MED ORDER — DEXAMETHASONE SODIUM PHOSPHATE 10 MG/ML IJ SOLN
10.0000 mg | Freq: Once | INTRAMUSCULAR | Status: AC
  Administered 2015-04-08: 10 mg via INTRAMUSCULAR
  Filled 2015-04-08: qty 1

## 2015-04-08 MED ORDER — BENZONATATE 100 MG PO CAPS
200.0000 mg | ORAL_CAPSULE | Freq: Once | ORAL | Status: AC
  Administered 2015-04-08: 200 mg via ORAL
  Filled 2015-04-08: qty 2

## 2015-04-08 MED ORDER — BENZONATATE 100 MG PO CAPS: 100.0000 mg | ORAL_CAPSULE | Freq: Three times a day (TID) | ORAL | Status: DC | PRN

## 2015-04-08 NOTE — ED Notes (Addendum)
Pt. reports central chest pain with SOB and dry cough onset this evening , denies nausea or diaphoresis , pt. stated history of DVT - he is taking anticoagulant .

## 2015-04-08 NOTE — Discharge Instructions (Signed)
Upper Respiratory Infection, Adult [name], your xray does not show pneumonia.  This is likely a viral infection and you need to see a primary care doctor within 3 days for close follow up.  Take medication for your cough as needed.  If symptoms worsen, come back to the ED immediately. Thank you. Most upper respiratory infections (URIs) are caused by a virus. A URI affects the nose, throat, and upper air passages. The most common type of URI is often called "the common cold." HOME CARE   Take medicines only as told by your doctor.  Gargle warm saltwater or take cough drops to comfort your throat as told by your doctor.  Use a warm mist humidifier or inhale steam from a shower to increase air moisture. This may make it easier to breathe.  Drink enough fluid to keep your pee (urine) clear or pale yellow.  Eat soups and other clear broths.  Have a healthy diet.  Rest as needed.  Go back to work when your fever is gone or your doctor says it is okay.  You may need to stay home longer to avoid giving your URI to others.  You can also wear a face mask and wash your hands often to prevent spread of the virus.  Use your inhaler more if you have asthma.  Do not use any tobacco products, including cigarettes, chewing tobacco, or electronic cigarettes. If you need help quitting, ask your doctor. GET HELP IF:  You are getting worse, not better.  Your symptoms are not helped by medicine.  You have chills.  You are getting more short of breath.  You have brown or red mucus.  You have yellow or brown discharge from your nose.  You have pain in your face, especially when you bend forward.  You have a fever.  You have puffy (swollen) neck glands.  You have pain while swallowing.  You have white areas in the back of your throat. GET HELP RIGHT AWAY IF:   You have very bad or constant:  Headache.  Ear pain.  Pain in your forehead, behind your eyes, and over your cheekbones  (sinus pain).  Chest pain.  You have long-lasting (chronic) lung disease and any of the following:  Wheezing.  Long-lasting cough.  Coughing up blood.  A change in your usual mucus.  You have a stiff neck.  You have changes in your:  Vision.  Hearing.  Thinking.  Mood. MAKE SURE YOU:   Understand these instructions.  Will watch your condition.  Will get help right away if you are not doing well or get worse.   This information is not intended to replace advice given to you by your health care provider. Make sure you discuss any questions you have with your health care provider.   Document Released: 08/13/2007 Document Revised: 07/11/2014 Document Reviewed: 06/01/2013 Elsevier Interactive Patient Education [employer]

## 2015-04-08 NOTE — ED Provider Notes (Signed)
CSN: [id number]     Arrival date & time 04/08/15  0227 History   By signing my name below, I, [name], attest that this documentation has been prepared under the direction and in the presence of [name], MD . Electronically Signed: Freida Thomas, Scribe. 04/08/2015. 3:27 AM.   Chief Complaint  Patient presents with  . Chest Pain    The history is provided by the patient. No language interpreter was used.   HPI Comments:  SLEVIN GUNBY is a 50 y.o. male with a history of HTN, PE, and DVT (on xarelto), who presents to the Emergency Department complaining of occasionally productive cough that began ~4 days ago. He reports associated SOB and CP with cough that began last night. 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 He denies fever, nausea and diaphoresis. He also denies h/o MI and major cardiac issues. No alleviating factors noted. Pt is currently on xarelto. He is a former smoker; states he has not smoked in 2 years.   Past Medical History  Diagnosis Date  . DVT (deep venous thrombosis) (HCC) 01/08/2011    lt leg  . DVT of lower limb, acute (HCC) 01/12/2011  . BPH (benign prostatic hyperplasia)   . Current use of long term anticoagulation   . 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 Noncompliance w/medication treatment due to intermit use of medication   . History of tobacco use     QUIT early 2013  . Hypertension   . Depression   . Enlarged prostate    Past Surgical History  Procedure Laterality Date  . Tonsillectomy    . Bilateral upper extremity angiogram N/A 07/23/2011    Procedure: BILATERAL UPPER EXTREMITY ANGIOGRAM;  Surgeon: Fransisco Hertz, MD;  Location: [facility] CATH LAB;  Service: Cardiovascular;  Laterality: N/A;   No family history on file. Social History  Substance Use Topics  . Smoking status: Former Smoker    Quit date: 07/18/2013  . Smokeless tobacco: Never Used  .  Alcohol Use: No     Comment: no alcohol since 09/01/13    Review of Systems  10 systems reviewed and all are negative for acute change except as noted in the HPI.   Allergies  Review of patient's allergies indicates no known allergies.  Home Medications   Prior to Admission medications   Medication Sig Start Date End Date Taking? Authorizing Provider  acetaminophen (TYLENOL) 650 MG CR tablet Take 650 mg by mouth every 8 (eight) hours as needed for pain.    Historical Provider, MD  B Complex Vitamins (VITAMIN-B COMPLEX PO) Take 50 mg by mouth daily.    Historical Provider, MD  fluticasone (FLONASE) 50 MCG/ACT nasal spray Place 1 spray into both nostrils daily. 01/25/15   [name], MD  glipiZIDE (GLUCOTROL) 5 MG tablet Take 5 mg by mouth 2 (two) times daily before a meal.    Historical Provider, MD  HYDROcodone-acetaminophen (NORCO/VICODIN) 5-325 MG tablet Take 2 tablets by mouth every 4 (four) hours as needed for moderate pain. Patient not taking: Reported on 01/24/2015 01/17/15   [name], MD  insulin glargine (LANTUS) 100 UNIT/ML injection Inject 10 Units into the skin at bedtime.    Historical Provider, MD  lisinopril (PRINIVIL,ZESTRIL) 20 MG tablet Take 20 mg by mouth daily.    Historical Provider, MD  loratadine-pseudoephedrine (CLARITIN-D 24-HOUR) 10-240 MG 24  hr tablet Take 1 tablet by mouth at bedtime.    Historical Provider, MD  metFORMIN (GLUCOPHAGE-XR) 750 MG 24 hr tablet Take 750 mg by mouth daily with breakfast.    Historical Provider, MD  Misc Natural Products (COLON CLEANSE PO) Take 1 tablet by mouth daily as needed (constipation).    Historical Provider, MD  Multiple Vitamins-Minerals (MULTIVITAMIN WITH MINERALS) tablet Take 1 tablet by mouth daily. 09/08/13   [name], NP  pravastatin (PRAVACHOL) 20 MG tablet Take 20 mg by mouth at bedtime.     Historical Provider, MD  rivaroxaban (XARELTO) 20 MG TABS tablet Take 1 tablet (20 mg total) by mouth  daily with supper. Patient taking differently: Take 20 mg by mouth daily.  09/08/13   [name], NP  tamsulosin (FLOMAX) 0.4 MG CAPS capsule Take 1 capsule (0.4 mg total) by mouth at bedtime. 09/08/13   [name], NP  traMADol (ULTRAM) 50 MG tablet Take 50 mg by mouth every 6 (six) hours as needed for moderate pain.    Historical Provider, MD   BP 114/82 mmHg  Pulse 82  Temp(Src) 97.5 F (36.4 C)  Resp 15  SpO2 96% Physical Exam  Constitutional: He is oriented to person, place, and time. Vital signs are normal. He appears well-developed and well-nourished.  Non-toxic appearance. He does not appear ill. No distress.  HENT:  Head: Normocephalic and atraumatic.  Nose: Nose normal.  Mouth/Throat: Oropharynx is clear and moist. No oropharyngeal exudate.  Eyes: Conjunctivae and EOM are normal. Pupils are equal, round, and reactive to light. No scleral icterus.  Neck: Normal range of motion. Neck supple. No tracheal deviation, no edema, no erythema and normal range of motion present. No thyroid mass and no thyromegaly present.  Cardiovascular: Normal rate, regular rhythm, S1 normal, S2 normal, normal heart sounds, intact distal pulses and normal pulses.  Exam reveals no gallop and no friction rub.   No murmur heard. Pulmonary/Chest: Effort normal and breath sounds normal. No respiratory distress. He has no wheezes. He has no rhonchi. He has no rales.  Abdominal: Soft. Normal appearance and bowel sounds are normal. He exhibits no distension, no ascites and no mass. There is no hepatosplenomegaly. There is no tenderness. There is no rebound, no guarding and no CVA tenderness.  Musculoskeletal: Normal range of motion. He exhibits edema. He exhibits no tenderness.  LLE edema  Lymphadenopathy:    He has no cervical adenopathy.  Neurological: He is alert and oriented to person, place, and time. He has normal strength. No cranial nerve deficit or sensory deficit.  Skin: Skin is warm, dry and  intact. No petechiae and no rash noted. He is not diaphoretic. No erythema. No pallor.  Psychiatric: He has a normal mood and affect. His behavior is normal. Judgment normal.  Nursing note and vitals reviewed.   ED Course  Procedures   DIAGNOSTIC STUDIES:  Oxygen Saturation is 99% on RA, normal by my interpretation.    COORDINATION OF CARE:  3:11 AM Discussed treatment plan with pt at bedside and pt agreed to plan.  Labs Review Labs Reviewed  BASIC METABOLIC PANEL - Abnormal; Notable for the following:    Sodium 133 (*)    Chloride 99 (*)    Glucose, Bld 355 (*)    All other components within normal limits  CBC    Imaging Review Dg Chest 2 View  04/08/2015  CLINICAL DATA:  Acute onset generalized chest pain and cough. Congestion. Shortness of breath. Initial encounter.  EXAM: CHEST  2 VIEW COMPARISON:  Chest radiograph performed 01/24/2015 FINDINGS: The lungs are well-aerated and clear. There is no evidence of focal opacification, pleural effusion or pneumothorax. The heart is normal in size; the mediastinal contour is within normal limits. No acute osseous abnormalities are seen. IMPRESSION: No acute cardiopulmonary process seen. Electronically Signed   By: Roanna Raider M.D.   On: 04/08/2015 03:10   I have personally reviewed and evaluated these images and lab results as part of my medical decision-making.   EKG Interpretation   Date/Time:  Sunday April 08 2015 02:31:56 EST Ventricular Rate:  88 PR Interval:  142 QRS Duration: 86 QT Interval:  336 QTC Calculation: 406 R Axis:   56 Text Interpretation:  Normal sinus rhythm T wave abnormality, consider  inferior ischemia Abnormal ECG No significant change since last tracing  Confirmed by Erroll Luna [phone]) on 04/08/2015 2:35:15 AM      MDM   Final diagnoses:  None    Patient presents to the ED for SOB, cough in the setting of recent cold.   CXR is negative for PNA.  This is likely a viral infection.   He also complains of a cough and was treated with  Jerilynn Som and a DuoNeb breathing treatment. Patient given Decadron as well. He appears well and in no acute distress, vital signs remain within his normal limits and he is safe or discharge.  I personally performed the services described in this documentation, which was scribed in my presence. The recorded information has been reviewed and is accurate.      [name], MD 04/08/15 [phone]

## 2015-04-10 ENCOUNTER — Emergency Department (HOSPITAL_COMMUNITY)
Admission: EM | Admit: 2015-04-10 | Discharge: 2015-04-11 | Disposition: A | Discharge status: Home / Self Care | Payer: Non-veteran care | Attending: Emergency Medicine | Admitting: Emergency Medicine

## 2015-04-10 ENCOUNTER — Emergency Department (HOSPITAL_COMMUNITY): Payer: Non-veteran care

## 2015-04-10 ENCOUNTER — Encounter (HOSPITAL_COMMUNITY): Payer: Self-pay | Admitting: *Deleted

## 2015-04-10 DIAGNOSIS — J209 Acute bronchitis, unspecified: Secondary | ICD-10-CM | POA: Insufficient documentation

## 2015-04-10 DIAGNOSIS — Z7902 Long term (current) use of antithrombotics/antiplatelets: Secondary | ICD-10-CM | POA: Insufficient documentation

## 2015-04-10 DIAGNOSIS — L819 Disorder of pigmentation, unspecified: Secondary | ICD-10-CM | POA: Diagnosis not present

## 2015-04-10 DIAGNOSIS — Z7984 Long term (current) use of oral hypoglycemic drugs: Secondary | ICD-10-CM | POA: Insufficient documentation

## 2015-04-10 DIAGNOSIS — Z794 Long term (current) use of insulin: Secondary | ICD-10-CM | POA: Diagnosis not present

## 2015-04-10 DIAGNOSIS — Z87891 Personal history of nicotine dependence: Secondary | ICD-10-CM | POA: Diagnosis not present

## 2015-04-10 DIAGNOSIS — I10 Essential (primary) hypertension: Secondary | ICD-10-CM | POA: Insufficient documentation

## 2015-04-10 DIAGNOSIS — Z79899 Other long term (current) drug therapy: Secondary | ICD-10-CM | POA: Diagnosis not present

## 2015-04-10 DIAGNOSIS — Z86718 Personal history of other venous thrombosis and embolism: Secondary | ICD-10-CM | POA: Insufficient documentation

## 2015-04-10 DIAGNOSIS — Z87438 Personal history of other diseases of male genital organs: Secondary | ICD-10-CM | POA: Diagnosis not present

## 2015-04-10 DIAGNOSIS — R0602 Shortness of breath: Secondary | ICD-10-CM | POA: Diagnosis present

## 2015-04-10 DIAGNOSIS — F329 Major depressive disorder, single episode, unspecified: Secondary | ICD-10-CM | POA: Insufficient documentation

## 2015-04-10 DIAGNOSIS — Z9119 Patient's noncompliance with other medical treatment and regimen: Secondary | ICD-10-CM | POA: Insufficient documentation

## 2015-04-10 LAB — CBG MONITORING, ED: GLUCOSE-CAPILLARY: 303 mg/dL — AB (ref 65–99)

## 2015-04-10 LAB — CBC WITH DIFFERENTIAL/PLATELET
BASOS ABS: 0 10*3/uL (ref 0.0–0.1)
Basophils Relative: 0 %
Eosinophils Absolute: 0.1 10*3/uL (ref 0.0–0.7)
Eosinophils Relative: 1 %
HCT: 45.6 % (ref 39.0–52.0)
Hemoglobin: 16.1 g/dL (ref 13.0–17.0)
Lymphocytes Relative: 57 %
Lymphs Abs: 3.8 10*3/uL (ref 0.7–4.0)
MCH: 32.3 pg (ref 26.0–34.0)
MCHC: 35.3 g/dL (ref 30.0–36.0)
MCV: 91.6 fL (ref 78.0–100.0)
MONO ABS: 0.4 10*3/uL (ref 0.1–1.0)
Monocytes Relative: 6 %
NEUTROS ABS: 2.4 10*3/uL (ref 1.7–7.7)
Neutrophils Relative %: 36 %
PLATELETS: 253 10*3/uL (ref 150–400)
RBC: 4.98 MIL/uL (ref 4.22–5.81)
RDW: 12.7 % (ref 11.5–15.5)
WBC: 6.8 10*3/uL (ref 4.0–10.5)

## 2015-04-10 LAB — PROTIME-INR
INR: 1.38 (ref 0.00–1.49)
Prothrombin Time: 17.1 seconds — ABNORMAL HIGH (ref 11.6–15.2)

## 2015-04-10 LAB — COMPREHENSIVE METABOLIC PANEL
ALBUMIN: 4.4 g/dL (ref 3.5–5.0)
ALT: 40 U/L (ref 17–63)
AST: 40 U/L (ref 15–41)
Alkaline Phosphatase: 113 U/L (ref 38–126)
Anion gap: 9 (ref 5–15)
BUN: 26 mg/dL — AB (ref 6–20)
CHLORIDE: 100 mmol/L — AB (ref 101–111)
CO2: 24 mmol/L (ref 22–32)
Calcium: 10.2 mg/dL (ref 8.9–10.3)
Creatinine, Ser: 1.16 mg/dL (ref 0.61–1.24)
GFR calc Af Amer: >60 mL/min (ref >60)
GFR calc non Af Amer: >60 mL/min (ref >60)
GLUCOSE: 361 mg/dL — AB (ref 65–99)
POTASSIUM: 4.9 mmol/L (ref 3.5–5.1)
SODIUM: 133 mmol/L — AB (ref 135–145)
Total Bilirubin: 1.1 mg/dL (ref 0.3–1.2)
Total Protein: 7.7 g/dL (ref 6.5–8.1)

## 2015-04-10 LAB — I-STAT TROPONIN, ED
TROPONIN I, POC: 0 ng/mL (ref 0.00–0.08)
TROPONIN I, POC: 0 ng/mL (ref 0.00–0.08)

## 2015-04-10 LAB — BRAIN NATRIURETIC PEPTIDE: B Natriuretic Peptide: 27.4 pg/mL (ref 0.0–100.0)

## 2015-04-10 LAB — APTT: APTT: 37 s (ref 24–37)

## 2015-04-10 MED ORDER — FAMOTIDINE IN NACL 20-0.9 MG/50ML-% IV SOLN
20.0000 mg | Freq: Once | INTRAVENOUS | Status: AC
  Administered 2015-04-10: 20 mg via INTRAVENOUS
  Filled 2015-04-10: qty 50

## 2015-04-10 MED ORDER — IPRATROPIUM-ALBUTEROL 0.5-2.5 (3) MG/3ML IN SOLN
3.0000 mL | Freq: Once | RESPIRATORY_TRACT | Status: AC
  Administered 2015-04-10: 3 mL via RESPIRATORY_TRACT
  Filled 2015-04-10: qty 3

## 2015-04-10 MED ORDER — IOHEXOL 350 MG/ML SOLN
100.0000 mL | Freq: Once | INTRAVENOUS | Status: AC | PRN
  Administered 2015-04-10: 100 mL via INTRAVENOUS

## 2015-04-10 MED ORDER — ASPIRIN 81 MG PO CHEW
324.0000 mg | CHEWABLE_TABLET | Freq: Once | ORAL | Status: AC
  Administered 2015-04-10: 324 mg via ORAL
  Filled 2015-04-10: qty 4

## 2015-04-10 NOTE — ED Provider Notes (Signed)
CSN: [id number]     Arrival [id number] 04/10/15  1831 History   First MD Initiated Contact with Patient 04/10/15 1908     Chief Complaint  Patient presents with  . Shortness of Breath     (Consider location/radiation/quality/duration/timing/severity/associated sxs/prior Treatment) HPI   Blood pressure 106/77, pulse 84, temperature 97.6 F (36.4 C), temperature source Oral, resp. rate 20, SpO2 98 %.  [name] is a 50 y.o. male with past medical history significant for DVT (taking Xarelto), arterial embolism, complaining of shortness of breath, chest tightness, postnasal drip, worsening over the course of 2 days. Patient was seen for similar yesterday, she was started on Solu-Medrol and cough suppressant with some relief. States this does not feel like prior PE, states that the chest pain right now is more tight, states when he had the PE was much more severe.  patient endorses dyspnea on exertion, cannot walk more than a few steps. He denies orthopnea, PND.Patient denies fever, chills, nausea, vomiting, dysuria, hemoptysis. On review of systems patient notes a subxiphoid pressure with a sour brash when he belches. Not taking any GERD medications. Patient follows at the [place] extensive family history of ACS. Former smoker   Past Medical History  Diagnosis Date  . DVT (deep venous thrombosis) (HCC) 01/08/2011    lt leg  . DVT of lower limb, acute (HCC) 01/12/2011  . BPH (benign prostatic hyperplasia)   . Current use of long term anticoagulation   . 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 Noncompliance w/medication treatment due to intermit use of medication   . History of tobacco use     QUIT early 2013  . Hypertension   . Depression   . Enlarged prostate    Past Surgical History  Procedure Laterality Date  . Tonsillectomy    . Bilateral upper extremity angiogram N/A 07/23/2011    Procedure: BILATERAL UPPER EXTREMITY ANGIOGRAM;   Surgeon: Fransisco Hertz, MD;  Location: [facility] CATH LAB;  Service: Cardiovascular;  Laterality: N/A;   No family history on file. Social History  Substance Use Topics  . Smoking status: Former Smoker    Quit date: 07/18/2013  . Smokeless tobacco: Never Used  . Alcohol Use: No     Comment: no alcohol since 09/01/13    Review of Systems  10 systems reviewed and found to be negative, except as noted in the HPI.   Allergies  Review of patient's allergies indicates no known allergies.  Home Medications   Prior to Admission medications   Medication Sig Start Date End Date Taking? Authorizing Provider  acetaminophen (TYLENOL) 650 MG CR tablet Take 650 mg by mouth every 8 (eight) hours as needed for pain.   Yes Historical Provider, MD  B Complex Vitamins (VITAMIN-B COMPLEX PO) Take 50 mg by mouth daily.   Yes Historical Provider, MD  benzonatate (TESSALON) 100 MG capsule Take 1 capsule (100 mg total) by mouth 3 (three) times daily as needed for cough. 04/08/15  Yes [name], MD  fluticasone (FLONASE) 50 MCG/ACT nasal spray Place 1 spray into both nostrils daily. 01/25/15  Yes [name], MD  glipiZIDE (GLUCOTROL) 5 MG tablet Take 5 mg by mouth 2 (two) times daily before a meal.   Yes Historical Provider, MD  insulin glargine (LANTUS) 100 UNIT/ML injection Inject 10 Units into the skin daily.    Yes Historical Provider, MD  lisinopril (PRINIVIL,ZESTRIL) 20 MG tablet Take 20  mg by mouth daily.   Yes Historical Provider, MD  metFORMIN (GLUCOPHAGE-XR) 750 MG 24 hr tablet Take 750 mg by mouth daily with breakfast.   Yes Historical Provider, MD  Multiple Vitamins-Minerals (MULTIVITAMIN WITH MINERALS) tablet Take 1 tablet by mouth daily. 09/08/13  Yes [name], NP  pravastatin (PRAVACHOL) 20 MG tablet Take 20 mg by mouth daily.    Yes Historical Provider, MD  rivaroxaban (XARELTO) 20 MG TABS tablet Take 1 tablet (20 mg total) by mouth daily with supper. Patient taking differently: Take  20 mg by mouth daily.  09/08/13  Yes [name], NP  tamsulosin (FLOMAX) 0.4 MG CAPS capsule Take 1 capsule (0.4 mg total) by mouth at bedtime. 09/08/13  Yes [name], NP  HYDROcodone-acetaminophen (NORCO/VICODIN) 5-325 MG tablet Take 2 tablets by mouth every 4 (four) hours as needed for moderate pain. Patient not taking: Reported on 01/24/2015 01/17/15   [name], MD  Misc Natural Products (COLON CLEANSE PO) Take 1 tablet by mouth daily as needed (constipation).    Historical Provider, MD  traMADol (ULTRAM) 50 MG tablet Take 50 mg by mouth every 6 (six) hours as needed for moderate pain.    Historical Provider, MD   BP 134/83 mmHg  Pulse 83  Temp(Src) 97.6 F (36.4 C) (Oral)  Resp 16  SpO2 98% Physical Exam  Constitutional: He is oriented to person, place, and time. He appears well-developed and well-nourished. No distress.  HENT:  Head: Normocephalic.  Mouth/Throat: Oropharynx is clear and moist.  Eyes: Conjunctivae and EOM are normal.  Neck: Normal range of motion. No JVD present. No tracheal deviation present.  Cardiovascular: Normal rate, regular rhythm and intact distal pulses.   Radial pulse equal bilaterally  Pulmonary/Chest: Effort normal and breath sounds normal. No stridor. No respiratory distress. He has no wheezes. He has no rales. He exhibits no tenderness.  Abdominal: Soft. He exhibits no distension and no mass. There is no tenderness. There is no rebound and no guarding.  Musculoskeletal: Normal range of motion. He exhibits no edema or tenderness.  Left lower extremity hyperpigmented, slightly more edematous than right   Neurological: He is alert and oriented to person, place, and time.  Skin: Skin is warm. He is not diaphoretic.  Psychiatric: He has a normal mood and affect.  Nursing note and vitals reviewed.   ED Course  Procedures (including critical care time) Labs Review Labs Reviewed  COMPREHENSIVE METABOLIC PANEL - Abnormal; Notable for  the following:    Sodium 133 (*)    Chloride 100 (*)    Glucose, Bld 361 (*)    BUN 26 (*)    All other components within normal limits  PROTIME-INR - Abnormal; Notable for the following:    Prothrombin Time 17.1 (*)    All other components within normal limits  CBG MONITORING, ED - Abnormal; Notable for the following:    Glucose-Capillary 303 (*)    All other components within normal limits  CBC WITH DIFFERENTIAL/PLATELET  APTT  BRAIN NATRIURETIC PEPTIDE  I-STAT TROPOININ, ED  Rosezena Sensor, ED    Imaging Review Dg Chest 2 View  04/10/2015  CLINICAL DATA:  Shortness of breath EXAM: CHEST  2 VIEW COMPARISON:  04/08/2015 chest radiograph. FINDINGS: Stable cardiomediastinal silhouette with normal heart size. No pneumothorax. No pleural effusion. Lungs appear clear, with no acute consolidative airspace disease and no pulmonary edema. IMPRESSION: No active cardiopulmonary disease. Electronically Signed   By: Delbert Phenix M.D.   On: 04/10/2015  19:07   Ct Angio Chest Pe W/cm &/or Wo Cm  04/10/2015  CLINICAL DATA:  Pt reports being seen at [facility] ED yesterday for same. Was dx with Bronchitis, was given steroid injection per pt and cough meds. Pt reports he is not feeling any better. Pt reports chest tightness and SOB, worse with exertion. EXAM: CT ANGIOGRAPHY CHEST WITH CONTRAST TECHNIQUE: Multidetector CT imaging of the chest was performed using the standard protocol during bolus administration of intravenous contrast. Multiplanar CT image reconstructions and MIPs were obtained to evaluate the vascular anatomy. CONTRAST:  OMNIPAQUE IOHEXOL 350 MG/ML SOLN COMPARISON:  Current chest radiograph.  Chest CT, 03/23/2012 FINDINGS: Angiographic study: No evidence of a pulmonary embolus. Great vessels are normal in caliber. No aortic atherosclerosis. Neck base and axilla: No mass or adenopathy. Visualized thyroid is unremarkable. Mediastinum and hila: Normal heart. No mediastinal or hilar masses or  enlarged lymph nodes. Lungs and pleura: Lung volumes are relatively low. There are minor areas of subsegmental atelectasis. No convincing pneumonia. No pulmonary edema. No mass or suspicious nodule. No pleural effusion or pneumothorax. Limited upper abdomen:  Unremarkable. Musculoskeletal: Mild disc degenerative changes along the mid to lower thoracic spine. Otherwise unremarkable. Review of the MIP images confirms the above findings. IMPRESSION: 1. No evidence of a pulmonary embolus. 2. No convincing acute findings. Relatively low lung volumes noted on the current exam. Mild areas of subsegmental atelectasis. No convincing pneumonia and no pulmonary edema. Electronically Signed   By: Amie Portland M.D.   On: 04/10/2015 20:54   I have personally reviewed and evaluated these images and lab results as part of my medical decision-making.   EKG Interpretation   Date/Time:  Tuesday April 10 2015 19:39:44 EST Ventricular Rate:  81 PR Interval:  140 QRS Duration: 84 QT Interval:  331 QTC Calculation: 384 R Axis:   32 Text Interpretation:  Sinus rhythm Nonspecific T abnormalities, inferior  leads No significant change was found Confirmed by Manus Gunning  MD, STEPHEN  [phone]) on 04/10/2015 8:04:18 PM      MDM   Final diagnoses:  Acute bronchitis, unspecified organism    Filed Vitals:   04/10/15 1841 04/10/15 2113 04/10/15 2200 04/10/15 2217  BP: 106/77 127/79 134/83   Pulse: 84  85 83  Temp: 97.6 F (36.4 C)     TempSrc: Oral     Resp: 20 22 16    SpO2: 98% 99% 98% 98%    Medications  albuterol (PROVENTIL HFA;VENTOLIN HFA) 108 (90 Base) MCG/ACT inhaler 2 puff (not administered)  ipratropium-albuterol (DUONEB) 0.5-2.5 (3) MG/3ML nebulizer solution 3 mL (3 mLs Nebulization Given 04/10/15 2001)  famotidine (PEPCID) IVPB 20 mg premix (0 mg Intravenous Stopped 04/10/15 2032)  aspirin chewable tablet 324 mg (324 mg Oral Given 04/10/15 2000)  iohexol (OMNIPAQUE) 350 MG/ML injection 100 mL (100 mLs  Intravenous Contrast Given 04/10/15 2040)    SHRIYANS KUENZI is 49 y.o. male presenting with nasal congestion, shortness of breath which she describes as dyspnea on exertion and chest tightness worsening over the course of several days, was diagnosed with a URI several days ago and states symptoms are not improving. Patient's EKG is nonischemic, troponin negative, chest x-rays without infiltrate or signs of fluid overload. Blood work otherwise unremarkable including troponin and BNP.  This patient has history of DVT, questionable compliance, will CT to rule out PE.  CT negative, delta troponin negative, as is a shared visit with the attending physician who is evaluated the patient and agrees  that this is unlikely anginal equivalent, think this is likely related to his URI/bronchitis. Patient is given a inhaler while in the ED.  Evaluation does not show pathology that would require ongoing emergent intervention or inpatient treatment. Pt is hemodynamically stable and mentating appropriately. Discussed findings and plan with patient/guardian, who agrees with care plan. All questions answered. Return precautions discussed and outpatient follow up given.       Wynetta Emery, PA-C 04/11/15 [id number]  Glynn Octave, MD 04/11/15 [phone]

## 2015-04-10 NOTE — ED Notes (Signed)
Pt ambulated for 50 feet.  Pt's O2 levels remained between 97 to 100 at times.  Pt's Pulse rate remained 85 to 93.  Pt ambulated without assistance.  Pt had to stop several times because he felt dizzy.  Pt also complained of difficulty breathing.

## 2015-04-10 NOTE — ED Notes (Signed)
Pt reports being seen at [facility] ED yesterday for same.  Was dx with Bronchitis, was given steroid injection per pt and cough meds.  Pt reports he is not feeling any better.  Pt reports chest tightness and SOB, worse with exertion.  Pt reports having a "lump" in his throat.

## 2015-04-11 MED ORDER — ALBUTEROL SULFATE HFA 108 (90 BASE) MCG/ACT IN AERS
2.0000 | INHALATION_SPRAY | Freq: Once | RESPIRATORY_TRACT | Status: AC
  Administered 2015-04-11: 2 via RESPIRATORY_TRACT
  Filled 2015-04-11: qty 6.7

## 2015-04-11 MED ORDER — PREDNISONE 50 MG PO TABS: ORAL_TABLET | ORAL | Status: DC

## 2015-04-11 NOTE — Discharge Instructions (Signed)
Use nasal saline (you can try [employer] Simply Saline) at least 4 times a day, use saline 5-10 minutes before using the fluticasone (flonase) nasal spray  Do not use Afrin (Oxymetazoline)  Rest, wash hands frequently  and drink plenty of water.  You may try counter medication such as Mucinex or Sudafed decongestant.  Please follow with your primary care doctor in the next 2 days for a check-up. They must obtain records for further management.   Do not hesitate to return to the Emergency Department for any new, worsening or concerning symptoms.    Acute Bronchitis Bronchitis is inflammation of the airways that extend from the windpipe into the lungs (bronchi). The inflammation often causes mucus to develop. This leads to a cough, which is the most common symptom of bronchitis.  In acute bronchitis, the condition usually develops suddenly and goes away over time, usually in a couple weeks. Smoking, allergies, and asthma can make bronchitis worse. Repeated episodes of bronchitis may cause further lung problems.  CAUSES Acute bronchitis is most often caused by the same virus that causes a cold. The virus can spread from person to person (contagious) through coughing, sneezing, and touching contaminated objects. SIGNS AND SYMPTOMS   Cough.   Fever.   Coughing up mucus.   Body aches.   Chest congestion.   Chills.   Shortness of breath.   Sore throat.  DIAGNOSIS  Acute bronchitis is usually diagnosed through a physical exam. Your health care provider will also ask you questions about your medical history. Tests, such as chest X-rays, are sometimes done to rule out other conditions.  TREATMENT  Acute bronchitis usually goes away in a couple weeks. Oftentimes, no medical treatment is necessary. Medicines are sometimes given for relief of fever or cough. Antibiotic medicines are usually not needed but may be prescribed in certain situations. In some cases, an inhaler may be  recommended to help reduce shortness of breath and control the cough. A cool mist vaporizer may also be used to help thin bronchial secretions and make it easier to clear the chest.  HOME CARE INSTRUCTIONS  Get plenty of rest.   Drink enough fluids to keep your urine clear or pale yellow (unless you have a medical condition that requires fluid restriction). Increasing fluids may help thin your respiratory secretions (sputum) and reduce chest congestion, and it will prevent dehydration.   Take medicines only as directed by your health care provider.  If you were prescribed an antibiotic medicine, finish it all even if you start to feel better.  Avoid smoking and secondhand smoke. Exposure to cigarette smoke or irritating chemicals will make bronchitis worse. If you are a smoker, consider using nicotine gum or skin patches to help control withdrawal symptoms. Quitting smoking will help your lungs heal faster.   Reduce the chances of another bout of acute bronchitis by washing your hands frequently, avoiding people with cold symptoms, and trying not to touch your hands to your mouth, nose, or eyes.   Keep all follow-up visits as directed by your health care provider.  SEEK MEDICAL CARE IF: Your symptoms do not improve after 1 week of treatment.  SEEK IMMEDIATE MEDICAL CARE IF:  You develop an increased fever or chills.   You have chest pain.   You have severe shortness of breath.  You have bloody sputum.   You develop dehydration.  You faint or repeatedly feel like you are going to pass out.  You develop repeated vomiting.  You develop a severe headache. MAKE SURE YOU:   Understand these instructions.  Will watch your condition.  Will get help right away if you are not doing well or get worse.   This information is not intended to replace advice given to you by your health care provider. Make sure you discuss any questions you have with your health care provider.     Document Released: 04/03/2004 Document Revised: 03/17/2014 Document Reviewed: 08/17/2012 Elsevier Interactive Patient Education [employer]2016 [employer] Inc.

## 2015-08-05 ENCOUNTER — Encounter (HOSPITAL_COMMUNITY): Payer: Self-pay

## 2015-08-05 ENCOUNTER — Emergency Department (HOSPITAL_COMMUNITY)
Admission: EM | Admit: 2015-08-05 | Discharge: 2015-08-05 | Disposition: A | Discharge status: Home / Self Care | Payer: Non-veteran care

## 2015-08-05 DIAGNOSIS — Z794 Long term (current) use of insulin: Secondary | ICD-10-CM | POA: Insufficient documentation

## 2015-08-05 DIAGNOSIS — E119 Type 2 diabetes mellitus without complications: Secondary | ICD-10-CM | POA: Insufficient documentation

## 2015-08-05 DIAGNOSIS — Z7951 Long term (current) use of inhaled steroids: Secondary | ICD-10-CM | POA: Insufficient documentation

## 2015-08-05 DIAGNOSIS — Z7984 Long term (current) use of oral hypoglycemic drugs: Secondary | ICD-10-CM | POA: Insufficient documentation

## 2015-08-05 DIAGNOSIS — Z79899 Other long term (current) drug therapy: Secondary | ICD-10-CM | POA: Insufficient documentation

## 2015-08-05 DIAGNOSIS — Z86718 Personal history of other venous thrombosis and embolism: Secondary | ICD-10-CM | POA: Insufficient documentation

## 2015-08-05 DIAGNOSIS — N451 Epididymitis: Secondary | ICD-10-CM | POA: Insufficient documentation

## 2015-08-05 DIAGNOSIS — I10 Essential (primary) hypertension: Secondary | ICD-10-CM | POA: Insufficient documentation

## 2015-08-05 DIAGNOSIS — Z7901 Long term (current) use of anticoagulants: Secondary | ICD-10-CM | POA: Insufficient documentation

## 2015-08-05 DIAGNOSIS — N400 Enlarged prostate without lower urinary tract symptoms: Secondary | ICD-10-CM | POA: Insufficient documentation

## 2015-08-05 DIAGNOSIS — Z9119 Patient's noncompliance with other medical treatment and regimen: Secondary | ICD-10-CM | POA: Insufficient documentation

## 2015-08-05 DIAGNOSIS — F329 Major depressive disorder, single episode, unspecified: Secondary | ICD-10-CM | POA: Insufficient documentation

## 2015-08-05 DIAGNOSIS — Z87891 Personal history of nicotine dependence: Secondary | ICD-10-CM | POA: Insufficient documentation

## 2015-08-05 NOTE — ED Notes (Signed)
Pt states he needs to leave and go home to check on his new born. Pt left by pov.

## 2015-08-05 NOTE — ED Notes (Signed)
Onset this morning left testicle swollen, red and painful and burning with urination.

## 2015-08-06 ENCOUNTER — Emergency Department (HOSPITAL_COMMUNITY): Payer: Non-veteran care

## 2015-08-06 ENCOUNTER — Emergency Department (HOSPITAL_COMMUNITY)
Admission: EM | Admit: 2015-08-06 | Discharge: 2015-08-06 | Disposition: A | Discharge status: Home / Self Care | Payer: Non-veteran care | Attending: Emergency Medicine | Admitting: Emergency Medicine

## 2015-08-06 DIAGNOSIS — N451 Epididymitis: Secondary | ICD-10-CM

## 2015-08-06 DIAGNOSIS — N5082 Scrotal pain: Secondary | ICD-10-CM

## 2015-08-06 LAB — URINE MICROSCOPIC-ADD ON

## 2015-08-06 LAB — URINALYSIS, ROUTINE W REFLEX MICROSCOPIC
BILIRUBIN URINE: NEGATIVE
Ketones, ur: NEGATIVE mg/dL
Nitrite: NEGATIVE
PH: 5.5 (ref 5.0–8.0)
Protein, ur: NEGATIVE mg/dL
SPECIFIC GRAVITY, URINE: 1.025 (ref 1.005–1.030)

## 2015-08-06 LAB — CBC WITH DIFFERENTIAL/PLATELET
Basophils Absolute: 0 10*3/uL (ref 0.0–0.1)
Basophils Relative: 0 %
EOS ABS: 0.1 10*3/uL (ref 0.0–0.7)
EOS PCT: 1 %
HCT: 41.8 % (ref 39.0–52.0)
HEMOGLOBIN: 14.2 g/dL (ref 13.0–17.0)
LYMPHS ABS: 3.2 10*3/uL (ref 0.7–4.0)
Lymphocytes Relative: 39 %
MCH: 31 pg (ref 26.0–34.0)
MCHC: 34 g/dL (ref 30.0–36.0)
MCV: 91.3 fL (ref 78.0–100.0)
MONO ABS: 0.7 10*3/uL (ref 0.1–1.0)
MONOS PCT: 8 %
Neutro Abs: 4.2 10*3/uL (ref 1.7–7.7)
Neutrophils Relative %: 52 %
PLATELETS: 290 10*3/uL (ref 150–400)
RBC: 4.58 MIL/uL (ref 4.22–5.81)
RDW: 13 % (ref 11.5–15.5)
WBC: 8.2 10*3/uL (ref 4.0–10.5)

## 2015-08-06 LAB — I-STAT CHEM 8, ED
BUN: 20 mg/dL (ref 6–20)
CALCIUM ION: 1.21 mmol/L (ref 1.12–1.23)
Chloride: 99 mmol/L — ABNORMAL LOW (ref 101–111)
Creatinine, Ser: 1.2 mg/dL (ref 0.61–1.24)
Glucose, Bld: 208 mg/dL — ABNORMAL HIGH (ref 65–99)
HCT: 46 % (ref 39.0–52.0)
Hemoglobin: 15.6 g/dL (ref 13.0–17.0)
Potassium: 4.3 mmol/L (ref 3.5–5.1)
SODIUM: 136 mmol/L (ref 135–145)
TCO2: 23 mmol/L (ref 0–100)

## 2015-08-06 MED ORDER — HYDROCODONE-ACETAMINOPHEN 5-325 MG PO TABS: 1.0000 | ORAL_TABLET | Freq: Four times a day (QID) | ORAL | Status: DC | PRN

## 2015-08-06 MED ORDER — LEVOFLOXACIN 500 MG PO TABS: 500.0000 mg | ORAL_TABLET | Freq: Every day | ORAL | Status: DC

## 2015-08-06 MED ORDER — CIPROFLOXACIN IN D5W 400 MG/200ML IV SOLN: 400.0000 mg | Freq: Once | INTRAVENOUS | Status: DC

## 2015-08-06 MED ORDER — LEVOFLOXACIN IN D5W 500 MG/100ML IV SOLN
500.0000 mg | Freq: Once | INTRAVENOUS | Status: AC
  Administered 2015-08-06: 500 mg via INTRAVENOUS
  Filled 2015-08-06 (×2): qty 100

## 2015-08-06 NOTE — Discharge Instructions (Signed)
Take ibuprofen or Tylenol for pain. Take Norco for severe pain only. Take Levaquin as prescribed until all gone for the infection. Please follow-up with urology.   Epididymitis Epididymitis is swelling (inflammation) of the epididymis. The epididymis is a cord-like structure that is located along the top and back part of the testicle. It collects and stores sperm from the testicle. This condition can also cause pain and swelling of the testicle and scrotum. Symptoms usually start suddenly (acute epididymitis). Sometimes epididymitis starts gradually and lasts for a while (chronic epididymitis). This type may be harder to treat. CAUSES In men 4435 and younger, this condition is usually caused by a bacterial infection or sexually transmitted disease (STD), such as:  Gonorrhea.  Chlamydia.  In men 3335 and older who do not have anal sex, this condition is usually caused by bacteria from a blockage or abnormalities in the urinary system. These can result from:  Having a tube placed into the bladder (urinary catheter).  Having an enlarged or inflamed prostate gland.  Having recent urinary tract surgery. In men who have a condition that weakens the body's defense system (immune system), such as HIV, this condition can be caused by:   Other bacteria, including tuberculosis and syphilis.  Viruses.  Fungi. Sometimes this condition occurs without infection. That may happen if urine flows backward into the epididymis after heavy lifting or straining. RISK FACTORS This condition is more likely to develop in men:  Who have unprotected sex with more than one partner.  Who have anal sex.   Who have recently had surgery.   Who have a urinary catheter.  Who have urinary problems.  Who have a suppressed immune system. SYMPTOMS  This condition usually begins suddenly with chills, fever, and pain behind the scrotum and in the testicle. Other symptoms include:   Swelling of the scrotum,  testicle, or both.  Pain whenejaculatingor urinating.  Pain in the back or belly.  Nausea.  Itching and discharge from the penis.  Frequent need to pass urine.  Redness and tenderness of the scrotum. DIAGNOSIS Your health care provider can diagnose this condition based on your symptoms and medical history. Your health care provider will also do a physical exam to ask about your symptoms and check your scrotum and testicle for swelling, pain, and redness. You may also have other tests, including:   Examination of discharge from the penis.  Urine tests for infections, such as STDs.  Your health care provider may test you for other STDs, including HIV. TREATMENT Treatment for this condition depends on the cause. If your condition is caused by a bacterial infection, oral antibiotic medicine may be prescribed. If the bacterial infection has spread to your blood, you may need to receive IV antibiotics. Nonbacterial epididymitis is treated with home care that includes bed rest and elevation of the scrotum. Surgery may be needed to treat:  Bacterial epididymitis that causes pus to build up in the scrotum (abscess).  Chronic epididymitis that has not responded to other treatments. HOME CARE INSTRUCTIONS Medicines  Take over-the-counter and prescription medicines only as told by your health care provider.   If you were prescribed an antibiotic medicine, take it as told by your health care provider. Do not stop taking the antibiotic even if your condition improves. Sexual Activity  If your epididymitis was caused by an STD, avoid sexual activity until your treatment is complete.  Inform your sexual partner or partners if you test positive for an STD. They may need  to be treated.Do not engage in sexual activity with your partner or partners until their treatment is completed. General Instructions  Return to your normal activities as told by your health care provider. Ask your  health care provider what activities are safe for you.  Keep your scrotum elevated and supported while resting. Ask your health care provider if you should wear a scrotal support, such as a jockstrap. Wear it as told by your health care provider.  If directed, apply ice to the affected area:   Put ice in a plastic bag.  Place a towel between your skin and the bag.  Leave the ice on for 20 minutes, 2-3 times per day.  Try taking a sitz bath to help with discomfort. This is a warm water bath that is taken while you are sitting down. The water should only come up to your hips and should cover your buttocks. Do this 3-4 times per day or as told by your health care provider.  Keep all follow-up visits as told by your health care provider. This is important. SEEK MEDICAL CARE IF:   You have a fever.   Your pain medicine is not helping.   Your pain is getting worse.   Your symptoms do not improve within three days.   This information is not intended to replace advice given to you by your health care provider. Make sure you discuss any questions you have with your health care provider.   Document Released: 02/22/2000 Document Revised: 11/15/2014 Document Reviewed: 07/12/2014 Elsevier Interactive Patient Education [employer]

## 2015-08-06 NOTE — ED Provider Notes (Signed)
CSN: [id number]     Arrival date & time 08/05/15  2228 History   First MD Initiated Contact with Patient 08/06/15 0202     Chief Complaint  Patient presents with  . Testicle Pain     (Consider location/radiation/quality/duration/timing/severity/associated sxs/prior Treatment) HPI [name] is a 50 y.o. male with history of BPH, prior DVT, hypertension, diabetes, presents to emergency department complaining of left scrotal pain and swelling. Patient states [name] noticed symptoms this morning. [name] states that [name] has had some dysuria, but attributes it to his prostate enlargement. [name] also states that the testicle felt warm and red. [name] denies any lesions or sores. [name] has taken Tylenol, 1 g which did not help. [name] denies any fever or chills. Denies any nausea or vomiting. Denies any abdominal or flank pain. [name] denies any injury to that area. No history of the same.  Past Medical History  Diagnosis Date  . DVT (deep venous thrombosis) (HCC) 01/08/2011    lt leg  . DVT of lower limb, acute (HCC) 01/12/2011  . BPH (benign prostatic hyperplasia)   . Current use of long term anticoagulation   . 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 Noncompliance w/medication treatment due to intermit use of medication   . History of tobacco use     QUIT early 2013  . Hypertension   . Depression   . Enlarged prostate    Past Surgical History  Procedure Laterality Date  . Tonsillectomy    . Bilateral upper extremity angiogram N/A 07/23/2011    Procedure: BILATERAL UPPER EXTREMITY ANGIOGRAM;  Surgeon: Fransisco Hertz, MD;  Location: [facility] CATH LAB;  Service: Cardiovascular;  Laterality: N/A;   History reviewed. No pertinent family history. Social History  Substance Use Topics  . Smoking status: Former Smoker    Quit date: 07/18/2013  . Smokeless tobacco: Never Used  . Alcohol Use: No     Comment: no alcohol since 09/01/13    Review of Systems   Constitutional: Negative for fever and chills.  Respiratory: Negative for cough, chest tightness and shortness of breath.   Cardiovascular: Negative for chest pain, palpitations and leg swelling.  Gastrointestinal: Negative for nausea, vomiting, abdominal pain, diarrhea and abdominal distention.  Genitourinary: Positive for dysuria, scrotal swelling and testicular pain. Negative for urgency, frequency, hematuria, discharge, penile swelling and penile pain.  Musculoskeletal: Negative for myalgias, arthralgias, neck pain and neck stiffness.  Skin: Negative for rash.  Allergic/Immunologic: Negative for immunocompromised state.  Neurological: Negative for dizziness, weakness, light-headedness, numbness and headaches.  All other systems reviewed and are negative.     Allergies  Review of patient's allergies indicates no known allergies.  Home Medications   Prior to Admission medications   Medication Sig Start Date End Date Taking? Authorizing Provider  acetaminophen (TYLENOL) 650 MG CR tablet Take 650 mg by mouth every 8 (eight) hours as needed for pain.    Historical Provider, MD  B Complex Vitamins (VITAMIN-B COMPLEX PO) Take 50 mg by mouth daily.    Historical Provider, MD  benzonatate (TESSALON) 100 MG capsule Take 1 capsule (100 mg total) by mouth 3 (three) times daily as needed for cough. 04/08/15   [name], MD  fluticasone (FLONASE) 50 MCG/ACT nasal spray Place 1 spray into both nostrils daily. 01/25/15   [name], MD  glipiZIDE (GLUCOTROL) 5 MG tablet Take 5 mg by mouth 2 (two) times daily before a meal.  Historical Provider, MD  HYDROcodone-acetaminophen (NORCO/VICODIN) 5-325 MG tablet Take 2 tablets by mouth every 4 (four) hours as needed for moderate pain. Patient not taking: Reported on 01/24/2015 01/17/15   Gilda Creasehristopher J Pollina, MD  insulin glargine (LANTUS) 100 UNIT/ML injection Inject 10 Units into the skin daily.     Historical Provider, MD  lisinopril  (PRINIVIL,ZESTRIL) 20 MG tablet Take 20 mg by mouth daily.    Historical Provider, MD  metFORMIN (GLUCOPHAGE-XR) 750 MG 24 hr tablet Take 750 mg by mouth daily with breakfast.    Historical Provider, MD  Misc Natural Products (COLON CLEANSE PO) Take 1 tablet by mouth daily as needed (constipation).    Historical Provider, MD  Multiple Vitamins-Minerals (MULTIVITAMIN WITH MINERALS) tablet Take 1 tablet by mouth daily. 09/08/13   Kizzie FantasiaEvanna Burkett, NP  pravastatin (PRAVACHOL) 20 MG tablet Take 20 mg by mouth daily.     Historical Provider, MD  rivaroxaban (XARELTO) 20 MG TABS tablet Take 1 tablet (20 mg total) by mouth daily with supper. Patient taking differently: Take 20 mg by mouth daily.  09/08/13   Kizzie FantasiaEvanna Burkett, NP  tamsulosin (FLOMAX) 0.4 MG CAPS capsule Take 1 capsule (0.4 mg total) by mouth at bedtime. 09/08/13   Kizzie FantasiaEvanna Burkett, NP  traMADol (ULTRAM) 50 MG tablet Take 50 mg by mouth every 6 (six) hours as needed for moderate pain.    Historical Provider, MD   BP 151/95 mmHg  Pulse 108  Temp(Src) 98.3 F (36.8 C) (Oral)  Resp 16  Ht 5\' 8"  (1.727 m)  Wt 92.171 kg  BMI 30.90 kg/m2  SpO2 95% Physical Exam  Constitutional: [name] is oriented to person, place, and time. [name] appears well-developed and well-nourished. No distress.  HENT:  Head: Normocephalic and atraumatic.  Eyes: Conjunctivae are normal.  Neck: Neck supple.  Cardiovascular: Normal rate, regular rhythm and normal heart sounds.   Pulmonary/Chest: Effort normal. No respiratory distress. [name] has no wheezes. [name] has no rales.  Abdominal: Soft. Bowel sounds are normal. [name] exhibits no distension. There is no tenderness. There is no rebound.  Genitourinary:  Left testicle is enlarged, tender to palpation. Cremasteric reflexes present. Normal penis, no discharge. No skin lesions or sores.  Musculoskeletal: [name] exhibits no edema.  Neurological: [name] is alert and oriented to person, place, and time.  Skin: Skin is warm and dry.  Nursing note and  vitals reviewed.   ED Course  Procedures (including critical care time) Labs Review Labs Reviewed  URINALYSIS, ROUTINE W REFLEX MICROSCOPIC (NOT AT [facility]) - Abnormal; Notable for the following:    Glucose, UA >1000 (*)    Hgb urine dipstick SMALL (*)    Leukocytes, UA SMALL (*)    All other components within normal limits  URINE MICROSCOPIC-ADD ON - Abnormal; Notable for the following:    Squamous Epithelial / LPF 0-5 (*)    Bacteria, UA RARE (*)    All other components within normal limits  I-STAT CHEM 8, ED - Abnormal; Notable for the following:    Chloride 99 (*)    Glucose, Bld 208 (*)    All other components within normal limits  URINE CULTURE  CBC WITH DIFFERENTIAL/PLATELET  GC/CHLAMYDIA PROBE AMP ([place]) NOT AT [facility]    Imaging Review [place] Scrotum  08/06/2015  CLINICAL DATA:  Left scrotal pain for 1 day. EXAM: SCROTAL ULTRASOUND DOPPLER ULTRASOUND OF THE TESTICLES TECHNIQUE: Complete ultrasound examination of the testicles, epididymis, and other scrotal structures was performed. Color and spectral Doppler ultrasound  were also utilized to evaluate blood flow to the testicles. COMPARISON:  None. FINDINGS: Right testicle Measurements: 3.3 x 1.8 x 2.2 cm. Multiple echogenic foci consistent microlithiasis. No mass visualized. Normal blood flow. Left testicle Measurements: 3.5 x 1.7 x 2.4 cm. Multiple echogenic foci consistent with microlithiasis. No mass visualized. Normal blood flow. Right epididymis:  Normal in size and appearance. Left epididymis:  Enlarged with increased vascularity. Hydrocele:  Small on the left. Varicocele:  None visualized. Pulsed Doppler interrogation of both testes demonstrates normal low resistance arterial and venous waveforms bilaterally. IMPRESSION: 1. Left epididymitis with reactive hydrocele. 2. No testicular torsion.  Normal blood flow to both testicles. 3. Bilateral testicular microlithiasis. Current literature suggests that testicular microlithiasis  is not a significant independent risk factor for development of testicular carcinoma, and that follow up imaging is not warranted in the absence of other risk factors. Monthly testicular self-examination and annual physical exams are considered appropriate surveillance. If patient has other risk factors for testicular carcinoma, then referral to Urology should be considered. (Reference: DeCastro, et al.: A 5-Year Follow up Study of Asymptomatic Men with Testicular Microlithiasis. J Urol 2008; 179:1420-1423.) Electronically Signed   By: Rubye Oaks M.D.   On: 08/06/2015 03:04   [place] Art/ven Flow Abd Pelv Doppler  08/06/2015  CLINICAL DATA:  Left scrotal pain for 1 day. EXAM: SCROTAL ULTRASOUND DOPPLER ULTRASOUND OF THE TESTICLES TECHNIQUE: Complete ultrasound examination of the testicles, epididymis, and other scrotal structures was performed. Color and spectral Doppler ultrasound were also utilized to evaluate blood flow to the testicles. COMPARISON:  None. FINDINGS: Right testicle Measurements: 3.3 x 1.8 x 2.2 cm. Multiple echogenic foci consistent microlithiasis. No mass visualized. Normal blood flow. Left testicle Measurements: 3.5 x 1.7 x 2.4 cm. Multiple echogenic foci consistent with microlithiasis. No mass visualized. Normal blood flow. Right epididymis:  Normal in size and appearance. Left epididymis:  Enlarged with increased vascularity. Hydrocele:  Small on the left. Varicocele:  None visualized. Pulsed Doppler interrogation of both testes demonstrates normal low resistance arterial and venous waveforms bilaterally. IMPRESSION: 1. Left epididymitis with reactive hydrocele. 2. No testicular torsion.  Normal blood flow to both testicles. 3. Bilateral testicular microlithiasis. Current literature suggests that testicular microlithiasis is not a significant independent risk factor for development of testicular carcinoma, and that follow up imaging is not warranted in the absence of other risk factors.  Monthly testicular self-examination and annual physical exams are considered appropriate surveillance. If patient has other risk factors for testicular carcinoma, then referral to Urology should be considered. (Reference: DeCastro, et al.: A 5-Year Follow up Study of Asymptomatic Men with Testicular Microlithiasis. J Urol 2008; 179:1420-1423.) Electronically Signed   By: Rubye Oaks M.D.   On: 08/06/2015 03:04   I have personally reviewed and evaluated these images and lab results as part of my medical decision-making.   EKG Interpretation None      MDM   Final diagnoses:  Scrotal pain  Epididymitis, left   Pt with left testicle pain, onset this morning. Testicle is enlarged, diffusely tender. Will get labs, UA, ultrasound.   Patient's ultrasound shows left epididymitis with reactive hydrocele. His old son also shows bilateral microlithiasis. His pain is most likely from epididymitis. No evidence of torsion. Urinalysis obtained and sent for culture. GC chlamydia obtained as well. Patient has no penile discharge and states [name] is not concerned about STI, will start on Levaquin. I will have patient follow with urology. Return precautions discussed. Patient is nontoxic-appearing otherwise, afebrile,  unremarkable labs except for elevated glucose. Stable for dc home.   Filed Vitals:   08/06/15 0309 08/06/15 0330 08/06/15 0400 08/06/15 0430  BP: 128/84 119/77 115/82 114/71  Pulse: 74 57 62 61  Temp:      TempSrc:      Resp: 16 16 16 15   Height:      Weight:      SpO2: 98% 97% 96% 95%     Jaynie Crumble, PA-C 08/06/15 0503  April Palumbo, MD 08/06/15 [phone]

## 2015-08-06 NOTE — ED Notes (Signed)
Patient transported to Ultrasound 

## 2015-08-06 NOTE — ED Notes (Signed)
EDP at bedside  

## 2015-08-07 LAB — URINE CULTURE: CULTURE: NO GROWTH

## 2015-08-07 LAB — GC/CHLAMYDIA PROBE AMP (~~LOC~~) NOT AT ARMC
Chlamydia: NEGATIVE
Neisseria Gonorrhea: NEGATIVE

## 2015-08-12 ENCOUNTER — Encounter (HOSPITAL_COMMUNITY): Payer: Self-pay

## 2015-08-12 ENCOUNTER — Encounter (HOSPITAL_COMMUNITY): Payer: Self-pay | Admitting: *Deleted

## 2015-08-12 ENCOUNTER — Emergency Department (HOSPITAL_COMMUNITY)
Admission: EM | Admit: 2015-08-12 | Discharge: 2015-08-12 | Disposition: A | Discharge status: Home / Self Care | Payer: Non-veteran care | Attending: Emergency Medicine | Admitting: Emergency Medicine

## 2015-08-12 ENCOUNTER — Inpatient Hospital Stay (HOSPITAL_COMMUNITY)
Admission: AD | Admit: 2015-08-12 | Discharge: 2015-08-19 | DRG: 885 | Disposition: A | Discharge status: Home / Self Care | Payer: No Typology Code available for payment source | Source: Intra-hospital | Attending: Psychiatry | Admitting: Psychiatry

## 2015-08-12 DIAGNOSIS — Z86718 Personal history of other venous thrombosis and embolism: Secondary | ICD-10-CM

## 2015-08-12 DIAGNOSIS — R739 Hyperglycemia, unspecified: Secondary | ICD-10-CM

## 2015-08-12 DIAGNOSIS — Z86711 Personal history of pulmonary embolism: Secondary | ICD-10-CM

## 2015-08-12 DIAGNOSIS — F1410 Cocaine abuse, uncomplicated: Secondary | ICD-10-CM | POA: Diagnosis present

## 2015-08-12 DIAGNOSIS — Z833 Family history of diabetes mellitus: Secondary | ICD-10-CM

## 2015-08-12 DIAGNOSIS — F1420 Cocaine dependence, uncomplicated: Secondary | ICD-10-CM | POA: Diagnosis present

## 2015-08-12 DIAGNOSIS — F411 Generalized anxiety disorder: Secondary | ICD-10-CM | POA: Diagnosis present

## 2015-08-12 DIAGNOSIS — Z7984 Long term (current) use of oral hypoglycemic drugs: Secondary | ICD-10-CM | POA: Diagnosis not present

## 2015-08-12 DIAGNOSIS — Z7901 Long term (current) use of anticoagulants: Secondary | ICD-10-CM | POA: Diagnosis not present

## 2015-08-12 DIAGNOSIS — I10 Essential (primary) hypertension: Secondary | ICD-10-CM | POA: Insufficient documentation

## 2015-08-12 DIAGNOSIS — G4700 Insomnia, unspecified: Secondary | ICD-10-CM | POA: Diagnosis present

## 2015-08-12 DIAGNOSIS — F332 Major depressive disorder, recurrent severe without psychotic features: Principal | ICD-10-CM | POA: Diagnosis present

## 2015-08-12 DIAGNOSIS — R45851 Suicidal ideations: Secondary | ICD-10-CM | POA: Diagnosis present

## 2015-08-12 DIAGNOSIS — Z87891 Personal history of nicotine dependence: Secondary | ICD-10-CM | POA: Diagnosis not present

## 2015-08-12 DIAGNOSIS — E1165 Type 2 diabetes mellitus with hyperglycemia: Secondary | ICD-10-CM | POA: Diagnosis not present

## 2015-08-12 DIAGNOSIS — F14220 Cocaine dependence with intoxication, uncomplicated: Secondary | ICD-10-CM | POA: Insufficient documentation

## 2015-08-12 DIAGNOSIS — G4710 Hypersomnia, unspecified: Secondary | ICD-10-CM | POA: Diagnosis present

## 2015-08-12 DIAGNOSIS — Z794 Long term (current) use of insulin: Secondary | ICD-10-CM | POA: Insufficient documentation

## 2015-08-12 DIAGNOSIS — E119 Type 2 diabetes mellitus without complications: Secondary | ICD-10-CM | POA: Diagnosis present

## 2015-08-12 DIAGNOSIS — Z818 Family history of other mental and behavioral disorders: Secondary | ICD-10-CM

## 2015-08-12 DIAGNOSIS — F329 Major depressive disorder, single episode, unspecified: Secondary | ICD-10-CM | POA: Diagnosis present

## 2015-08-12 HISTORY — DX: Type 2 diabetes mellitus without complications: E11.9

## 2015-08-12 LAB — RAPID URINE DRUG SCREEN, HOSP PERFORMED
Amphetamines: NOT DETECTED
BARBITURATES: NOT DETECTED
Benzodiazepines: NOT DETECTED
COCAINE: POSITIVE — AB
Opiates: NOT DETECTED
TETRAHYDROCANNABINOL: NOT DETECTED

## 2015-08-12 LAB — COMPREHENSIVE METABOLIC PANEL
ALK PHOS: 60 U/L (ref 38–126)
ALT: 33 U/L (ref 17–63)
ANION GAP: 9 (ref 5–15)
AST: 26 U/L (ref 15–41)
Albumin: 4.1 g/dL (ref 3.5–5.0)
BILIRUBIN TOTAL: 0.6 mg/dL (ref 0.3–1.2)
BUN: 23 mg/dL — ABNORMAL HIGH (ref 6–20)
CALCIUM: 9.3 mg/dL (ref 8.9–10.3)
CO2: 26 mmol/L (ref 22–32)
CREATININE: 1.32 mg/dL — AB (ref 0.61–1.24)
Chloride: 99 mmol/L — ABNORMAL LOW (ref 101–111)
GLUCOSE: 305 mg/dL — AB (ref 65–99)
Potassium: 4.2 mmol/L (ref 3.5–5.1)
Sodium: 134 mmol/L — ABNORMAL LOW (ref 135–145)
TOTAL PROTEIN: 7.3 g/dL (ref 6.5–8.1)

## 2015-08-12 LAB — CBC
HEMATOCRIT: 42.8 % (ref 39.0–52.0)
HEMOGLOBIN: 15.4 g/dL (ref 13.0–17.0)
MCH: 32.4 pg (ref 26.0–34.0)
MCHC: 36 g/dL (ref 30.0–36.0)
MCV: 89.9 fL (ref 78.0–100.0)
Platelets: 294 10*3/uL (ref 150–400)
RBC: 4.76 MIL/uL (ref 4.22–5.81)
RDW: 13 % (ref 11.5–15.5)
WBC: 7.9 10*3/uL (ref 4.0–10.5)

## 2015-08-12 LAB — ACETAMINOPHEN LEVEL

## 2015-08-12 LAB — SALICYLATE LEVEL: Salicylate Lvl: <4 mg/dL (ref 2.8–30.0)

## 2015-08-12 LAB — ETHANOL: Alcohol, Ethyl (B): <5 mg/dL (ref <5)

## 2015-08-12 LAB — GLUCOSE, CAPILLARY: Glucose-Capillary: 325 mg/dL — ABNORMAL HIGH (ref 65–99)

## 2015-08-12 MED ORDER — ACETAMINOPHEN 325 MG PO TABS
650.0000 mg | ORAL_TABLET | Freq: Four times a day (QID) | ORAL | Status: DC | PRN
  Administered 2015-08-12 – 2015-08-14 (×4): 650 mg via ORAL
  Filled 2015-08-12 (×4): qty 2

## 2015-08-12 MED ORDER — TRAZODONE HCL 50 MG PO TABS
50.0000 mg | ORAL_TABLET | Freq: Every evening | ORAL | Status: DC | PRN
  Administered 2015-08-13 – 2015-08-14 (×2): 50 mg via ORAL
  Filled 2015-08-12: qty 1
  Filled 2015-08-12: qty 7
  Filled 2015-08-12: qty 1

## 2015-08-12 MED ORDER — HYDROXYZINE HCL 25 MG PO TABS
25.0000 mg | ORAL_TABLET | Freq: Four times a day (QID) | ORAL | Status: DC | PRN
  Administered 2015-08-12 – 2015-08-16 (×3): 25 mg via ORAL
  Filled 2015-08-12 (×2): qty 1
  Filled 2015-08-12: qty 10
  Filled 2015-08-12: qty 1

## 2015-08-12 MED ORDER — MAGNESIUM HYDROXIDE 400 MG/5ML PO SUSP: 30.0000 mL | Freq: Every day | ORAL | Status: DC | PRN

## 2015-08-12 MED ORDER — ALUM & MAG HYDROXIDE-SIMETH 200-200-20 MG/5ML PO SUSP
30.0000 mL | ORAL | Status: DC | PRN
Start: 2015-08-12 — End: 2015-08-14

## 2015-08-12 NOTE — Discharge Instructions (Signed)
Go to behavioral health 

## 2015-08-12 NOTE — ED Notes (Signed)
Pt states depressed and suicidal.  [facility].  As being turned down, pt is stating he will run car into something.  [facility].

## 2015-08-12 NOTE — Plan of Care (Signed)
[facility] Observation Crisis Plan  Reason for Crisis Plan:  Crisis Stabilization and Substance Abuse   Plan of Care:  Referral for Substance Abuse  Family Support:      Current Living Environment:  Living Arrangements: Spouse/significant other  Insurance:   Hospital Account    Name Acct ID Class Status Primary Coverage   [name], [name] [id number] BEHAVIORAL HEALTH OBSERVATION Open None        Guarantor Account (for Hospital Account [id number])    Name Relation to Pt Service Area Active? Acct Type   [name], [name] [facility]   Address Phone       [address] [place], [place] [id number] [phone](Thomas)          Coverage Information (for Hospital Account [id number])    Not on file      Legal Guardian:  Legal Guardian: Other: (self)  Primary Care Provider:  [name], [name], [name]  Current Outpatient Providers:  none  Psychiatrist:  Name of Psychiatrist: NA  Counselor/Therapist:  Name of Therapist: NA  Compliant with Medications:  No  Additional Information:   [name], [name] 6/4/20176:51 PM

## 2015-08-12 NOTE — Progress Notes (Signed)
Pt presents to Observation unit alert and cooperative. Pt admits to reporting being suicidal because he keeps getting turned down for rehab. Pt stated to staff he will run his car into something.  -HI/A/V/H. Pt is requesting help for cocaine use. Non-compliant with medications including insulin. Emotional support and encouragement given. Pt admitted for evaluation, stabilization and reduction of baseline.

## 2015-08-12 NOTE — ED Provider Notes (Signed)
CSN: [id number]     Arrival date & time 08/12/15  1407 History   First MD Initiated Contact with Patient 08/12/15 1444     Chief Complaint  Patient presents with  . Depression  . Suicidal     (Consider location/radiation/quality/duration/timing/severity/associated sxs/prior Treatment) HPI   [name] is a(n) 50 y.o. male who presents to the the emergency department for medical clearance. He has been using crack for the past 4 days. Had patient presented earlier to the behavioral health [facility] and demanded treatment and stated that he would drive his car into oncoming traffic try to kill himself if he did not get immediate treatment. He states that he had of the vehicle months ago was in the neonatal ICU, who is doing quite well now. However, he did not handle the stress very well. He complains of about 3 weeks of severe depression. He denies audiovisual hallucinations or homicidal ideation. He denies other drug abuse. 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 He has not taken any of his medications in the past 4 days. He denies a history of psychiatric illness, history of completed suicide and his family. He does not have access to weapons. He denies any calf pain or swelling, chest pain or shortness of breath.  Past Medical History  Diagnosis Date  . DVT (deep venous thrombosis) (HCC) 01/08/2011    lt leg  . DVT of lower limb, acute (HCC) 01/12/2011  . BPH (benign prostatic hyperplasia)   . Current use of long term anticoagulation   . 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 Noncompliance w/medication treatment due to intermit use of medication   . History of tobacco use     QUIT early 2013  . Hypertension   . Depression   . Enlarged prostate    Past Surgical History  Procedure Laterality Date  . Tonsillectomy    . Bilateral upper extremity angiogram N/A 07/23/2011    Procedure: BILATERAL UPPER EXTREMITY ANGIOGRAM;   Surgeon: Fransisco HertzBrian L Chen, MD;  [facility];  Service: Cardiovascular;  Laterality: N/A;   History reviewed. No pertinent family history. Social History  Substance Use Topics  . Smoking status: Former Smoker    Quit date: 07/18/2013  . Smokeless tobacco: Never Used  . Alcohol Use: No     Comment: no alcohol since 09/01/13    Review of Systems  Ten systems reviewed and are negative for acute change, except as noted in the HPI.    Allergies  Review of patient's allergies indicates no known allergies.  Home Medications   Prior to Admission medications   Medication Sig Start Date End Date Taking? Authorizing Provider  acetaminophen (TYLENOL) 650 MG CR tablet Take 650 mg by mouth every 8 (eight) hours as needed for pain.    Historical Provider, MD  benzonatate (TESSALON) 100 MG capsule Take 1 capsule (100 mg total) by mouth 3 (three) times daily as needed for cough. 04/08/15   Tomasita CrumbleAdeleke Oni, MD  fluticasone (FLONASE) 50 MCG/ACT nasal spray Place 1 spray into both nostrils daily. Patient taking differently: Place 1 spray into both nostrils daily as needed for allergies.  01/25/15   Gilda Creasehristopher J Pollina, MD  glipiZIDE (GLUCOTROL) 5 MG tablet Take 5 mg by mouth 2 (two) times daily before a meal.    Historical Provider, MD  HYDROcodone-acetaminophen (NORCO) 5-325 MG tablet Take 1 tablet by mouth every 6 (six) hours as needed. 08/06/15  Tatyana Kirichenko, PA-C  insulin glargine (LANTUS) 100 UNIT/ML injection Inject 50 Units into the skin daily.     Historical Provider, MD  levofloxacin (LEVAQUIN) 500 MG tablet Take 1 tablet (500 mg total) by mouth daily. 08/06/15   Tatyana Kirichenko, PA-C  lisinopril (PRINIVIL,ZESTRIL) 20 MG tablet Take 20 mg by mouth daily.    Historical Provider, MD  metFORMIN (GLUCOPHAGE-XR) 750 MG 24 hr tablet Take 750 mg by mouth daily with breakfast.    Historical Provider, MD  Multiple Vitamins-Minerals (MULTIVITAMIN WITH MINERALS) tablet Take 1 tablet by mouth  daily. 09/08/13   [name], NP  pravastatin (PRAVACHOL) 20 MG tablet Take 20 mg by mouth daily.     Historical Provider, MD  rivaroxaban (XARELTO) 20 MG TABS tablet Take 1 tablet (20 mg total) by mouth daily with supper. Patient taking differently: Take 20 mg by mouth daily.  09/08/13   [name], NP  tamsulosin (FLOMAX) 0.4 MG CAPS capsule Take 1 capsule (0.4 mg total) by mouth at bedtime. 09/08/13   [name], NP  traMADol (ULTRAM) 50 MG tablet Take 50 mg by mouth every 6 (six) hours as needed for moderate pain.    Historical Provider, MD   BP 165/87 mmHg  Pulse 65  Temp(Src) 98.5 F (36.9 C) (Oral)  Resp 18  SpO2 96% Physical Exam  Constitutional: He appears well-developed and well-nourished. No distress.  HENT:  Head: Normocephalic and atraumatic.  Eyes: Conjunctivae are normal. No scleral icterus.  Neck: Normal range of motion. Neck supple.  Cardiovascular: Normal rate, regular rhythm and normal heart sounds.   Pulmonary/Chest: Effort normal and breath sounds normal. No respiratory distress.  Abdominal: Soft. There is no tenderness.  Musculoskeletal: He exhibits no edema.  Neurological: He is alert.  Skin: Skin is warm and dry. He is not diaphoretic.  Psychiatric: His behavior is normal.  Nursing note and vitals reviewed.   ED Course  Procedures (including critical care time) Labs Review Labs Reviewed  CBC  COMPREHENSIVE METABOLIC PANEL  ETHANOL  SALICYLATE LEVEL  ACETAMINOPHEN LEVEL  URINE RAPID DRUG SCREEN, HOSP PERFORMED    Imaging Review No results found. I have personally reviewed and evaluated these images and lab results as part of my medical decision-making.   EKG Interpretation None      MDM   Final diagnoses:  None    3:45 PM BP 165/87 mmHg  Pulse 65  Temp(Src) 98.5 F (36.9 C) (Oral)  Resp 18  SpO2 96% Patient with complaints of drug addiction, feeling suicidal. He has a bed at behavioral health and here for medical  clearance.   Patient has obs bed at [facility]. His blood sugar is elevated bc he has not taken his medications in the past 4 days. No anion gap. Awaiting uds but medically clear for dc.   Arthor Captain, PA-C 08/13/15 1239  Cathren Laine, MD 08/13/15 [phone]

## 2015-08-12 NOTE — Progress Notes (Signed)
[facility] INPATIENT:  Family/Significant Other Suicide Prevention Education  Suicide Prevention Education:  Patient Refusal for Family/Significant Other Suicide Prevention Education: The patient [name] has refused to provide written consent for family/significant other to be provided Family/Significant Other Suicide Prevention Education during admission and/or prior to discharge.   [name], [name] 08/12/2015, 6:58 PM

## 2015-08-12 NOTE — BH Assessment (Addendum)
Tele Assessment Note   [name] is an 50 y.o. male. Pt arrived to [facility] reporting crack cocaine addiction. Pt states he is in need of inpatient treatment. Pt reports feeling depressed due to his baby recently being born prematurely. Pt denies SI/HI. Pt denies AVH. Pt reports previous hospitalizations at the [facility] and [facility] for SA and depression. Pt denies current outpatient treatment. Pt denies current mental health medication. Pt states he uses $200 of crack cocaine a day. Writer consulted with Jeremy Rieger, NP. Per [name] Pt did not meet inpatient criteria. Pt was provided with outpatient SA resources. Pt left [facility] and returned. When the Pt returned he stated that he would run his car off a bridge.   Writer consulted with Jeremy Rieger, NP after the Pt returned to [facility]. Pt went to [facility] for medical clearance. Pt was admitted to the Obs unit.  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 DVT (deep venous thrombosis) (HCC) 01/08/2011    lt leg  . DVT of lower limb, acute (HCC) 01/12/2011  . BPH (benign prostatic hyperplasia)   . Current use of long term anticoagulation   . 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 Noncompliance w/medication treatment due to intermit use of medication   . History of tobacco use     QUIT early 2013  . Hypertension   . Depression   . Enlarged prostate     Past Surgical History  Procedure Laterality Date  . Tonsillectomy    . Bilateral upper extremity angiogram N/A 07/23/2011    Procedure: BILATERAL UPPER EXTREMITY ANGIOGRAM;  Surgeon: Fransisco Hertz, MD;  Location: [facility] CATH LAB;  Service: Cardiovascular;  Laterality: N/A;    Family History: No family history on file.  Social History:  reports that he quit smoking about 2 years ago. He has never used smokeless tobacco. He reports that he does not drink alcohol or use illicit  drugs.  Additional Social History:  Alcohol / Drug Use Pain Medications: Pt denies  Prescriptions: Pt denies Over the Counter: Pt denies  History of alcohol / drug use?: Yes Longest period of sobriety (when/how long): unknown Substance #1 Name of Substance 1: crack cocaine 1 - Age of First Use: unknown 1 - Amount (size/oz): $200 1 - Frequency: daily 1 - Duration: ongoing 1 - Last Use / Amount: 08/12/15  CIWA:   COWS:    PATIENT STRENGTHS: (choose at least two) Average or above average intelligence Capable of independent living  Allergies: No Known Allergies  Home Medications:  (Not in a hospital admission)  OB/GYN Status:  No LMP for male patient.  General Assessment Data Location of Assessment: [facility] Services TTS Assessment: In system Is this a Tele or Face-to-Face Assessment?: Face-to-Face Is this an Initial Assessment or a Re-assessment for this encounter?: Initial Assessment Marital status: Married [place] name: NA Is patient pregnant?: No Pregnancy Status: No Living Arrangements: Spouse/significant other Can pt return to current living arrangement?: Yes Admission Status: Voluntary Is patient capable of signing voluntary admission?: Yes Referral Source: Self/Family/Friend Insurance type: SP     Crisis Care Plan Living Arrangements: Spouse/significant other Legal Guardian: Other: (self) Name of Psychiatrist: NA Name of Therapist: NA  Education Status Is patient currently in school?: No Current Grade: 12 Highest grade of school patient has completed: NA Name of school: NA Contact person: NA  Risk  to self with the past 6 months Suicidal Ideation: Yes-Currently Present Has patient been a risk to self within the past 6 months prior to admission? : No Suicidal Intent: No Has patient had any suicidal intent within the past 6 months prior to admission? : No Is patient at risk for suicide?: Yes Suicidal Plan?: Yes-Currently Present Has patient had  any suicidal plan within the past 6 months prior to admission? : Yes Specify Current Suicidal Plan: to run his car off a bridge Access to Means: Yes Specify Access to Suicidal Means: access to car What has been your use of drugs/alcohol within the last 12 months?: crack cocaine Previous Attempts/Gestures: Yes How many times?: 1 Other Self Harm Risks: NA Triggers for Past Attempts: Unpredictable Intentional Self Injurious Behavior: None Family Suicide History: No Recent stressful life event(s): Other (Comment) (child in [facility]) Persecutory voices/beliefs?: No Depression: Yes Depression Symptoms: Despondent, Insomnia, Isolating, Fatigue, Loss of interest in usual pleasures, Feeling worthless/self pity, Feeling angry/irritable Substance abuse history and/or treatment for substance abuse?: No Suicide prevention information given to non-admitted patients: Not applicable  Risk to Others within the past 6 months Homicidal Ideation: No Does patient have any lifetime risk of violence toward others beyond the six months prior to admission? : No Thoughts of Harm to Others: No Current Homicidal Intent: No Current Homicidal Plan: No Access to Homicidal Means: No Identified Victim: NA History of harm to others?: No Assessment of Violence: None Noted Violent Behavior Description: NA Does patient have access to weapons?: No Criminal Charges Pending?: No Does patient have a court date: No Is patient on probation?: No  Psychosis Hallucinations: None noted Delusions: None noted  Mental Status Report Appearance/Hygiene: Unremarkable Eye Contact: Fair Motor Activity: Freedom of movement Speech: Logical/coherent Level of Consciousness: Alert Mood: Depressed, Sad Affect: Depressed, Sad Anxiety Level: Minimal Thought Processes: Coherent, Relevant Judgement: Unimpaired Orientation: Person, Place, Time, Situation, Appropriate for developmental age Obsessive Compulsive Thoughts/Behaviors:  None  Cognitive Functioning Concentration: Normal Memory: Recent Intact, Remote Intact IQ: Average Insight: Fair Impulse Control: Fair Appetite: Fair Weight Loss: 0 Weight Gain: 0 Sleep: Decreased Total Hours of Sleep: 4 Vegetative Symptoms: None  ADLScreening [facility] Assessment Services) Patient's cognitive ability adequate to safely complete daily activities?: Yes Patient able to express need for assistance with ADLs?: Yes Independently performs ADLs?: Yes (appropriate for developmental age)  Prior Inpatient Therapy Prior Inpatient Therapy: Yes Prior Therapy Dates: unknown [facility] Reason for Treatment: depression, cocaine use  Prior Outpatient Therapy Prior Outpatient Therapy: No Prior Therapy Dates: NA Prior Therapy Facilty/Provider(s): NA Reason for Treatment: NA Does patient have an ACCT team?: No Does patient have Intensive In-House Services?  : No Does patient have [facility] services? : No Does patient have P4CC services?: No  ADL Screening (condition at time of admission) Patient's cognitive ability adequate to safely complete daily activities?: Yes Is the patient deaf or have difficulty hearing?: No Does the patient have difficulty seeing, even when wearing glasses/contacts?: No Does the patient have difficulty concentrating, remembering, or making decisions?: No Patient able to express need for assistance with ADLs?: Yes Does the patient have difficulty dressing or bathing?: No Independently performs ADLs?: Yes (appropriate for developmental age) Does the patient have difficulty walking or climbing stairs?: No Weakness of Legs: None Weakness of Arms/Hands: None       Abuse/Neglect Assessment (Assessment to be complete while patient is alone) Physical Abuse: Denies Verbal Abuse: Denies Sexual Abuse: Denies Exploitation of patient/patient's resources: Denies Self-Neglect: Denies  Values / Beliefs Cultural Requests During  Hospitalization: None Spiritual Requests During Hospitalization: None   Advance Directives (For Healthcare) Does patient have an advance directive?: No Would patient like information on creating an advanced directive?: No - patient declined information    Additional Information 1:1 In Past 12 Months?: No CIRT Risk: No Elopement Risk: No Does patient have medical clearance?: No     Disposition:  Disposition Initial Assessment Completed for this Encounter: Yes Disposition of Patient: Other dispositions (Obs) Other disposition(s): Other (Comment) (Obs)  Yarisa Lynam D 08/12/2015 1:50 PM

## 2015-08-12 NOTE — ED Notes (Signed)
Unable to collect labs PA at bedside 

## 2015-08-13 DIAGNOSIS — F1410 Cocaine abuse, uncomplicated: Secondary | ICD-10-CM | POA: Diagnosis not present

## 2015-08-13 DIAGNOSIS — F332 Major depressive disorder, recurrent severe without psychotic features: Principal | ICD-10-CM

## 2015-08-13 LAB — GLUCOSE, CAPILLARY
GLUCOSE-CAPILLARY: 276 mg/dL — AB (ref 65–99)
GLUCOSE-CAPILLARY: 273 mg/dL — AB (ref 65–99)
GLUCOSE-CAPILLARY: 196 mg/dL — AB (ref 65–99)
GLUCOSE-CAPILLARY: 184 mg/dL — AB (ref 65–99)

## 2015-08-13 MED ORDER — DULOXETINE HCL 30 MG PO CPEP
30.0000 mg | ORAL_CAPSULE | Freq: Every day | ORAL | Status: DC
  Administered 2015-08-13 – 2015-08-19 (×7): 30 mg via ORAL
  Filled 2015-08-13: qty 7
  Filled 2015-08-13 (×7): qty 1
  Filled 2015-08-13: qty 7
  Filled 2015-08-13: qty 1

## 2015-08-13 MED ORDER — INSULIN ASPART 100 UNIT/ML ~~LOC~~ SOLN
0.0000 [IU] | Freq: Three times a day (TID) | SUBCUTANEOUS | Status: DC
  Administered 2015-08-13: 8 [IU] via SUBCUTANEOUS
  Administered 2015-08-13 – 2015-08-14 (×3): 3 [IU] via SUBCUTANEOUS
  Administered 2015-08-14 – 2015-08-15 (×2): 5 [IU] via SUBCUTANEOUS
  Administered 2015-08-15: 3 [IU] via SUBCUTANEOUS
  Administered 2015-08-15: 5 [IU] via SUBCUTANEOUS
  Administered 2015-08-16 (×2): 3 [IU] via SUBCUTANEOUS
  Administered 2015-08-16: 5 [IU] via SUBCUTANEOUS
  Administered 2015-08-17: 3 [IU] via SUBCUTANEOUS
  Administered 2015-08-17: 5 [IU] via SUBCUTANEOUS
  Administered 2015-08-17: 2 [IU] via SUBCUTANEOUS
  Administered 2015-08-18: 3 [IU] via SUBCUTANEOUS
  Administered 2015-08-18: 8 [IU] via SUBCUTANEOUS
  Administered 2015-08-18 – 2015-08-19 (×2): 5 [IU] via SUBCUTANEOUS
  Administered 2015-08-19: 3 [IU] via SUBCUTANEOUS

## 2015-08-13 MED ORDER — RIVAROXABAN 20 MG PO TABS
20.0000 mg | ORAL_TABLET | Freq: Every day | ORAL | Status: DC
  Administered 2015-08-13 – 2015-08-18 (×6): 20 mg via ORAL
  Filled 2015-08-13 (×4): qty 1
  Filled 2015-08-13: qty 3
  Filled 2015-08-13: qty 1
  Filled 2015-08-13: qty 3
  Filled 2015-08-13 (×2): qty 1

## 2015-08-13 MED ORDER — INSULIN GLARGINE 100 UNIT/ML ~~LOC~~ SOLN
20.0000 [IU] | Freq: Every day | SUBCUTANEOUS | Status: DC
  Administered 2015-08-13: 20 [IU] via SUBCUTANEOUS

## 2015-08-13 MED ORDER — GLIPIZIDE 2.5 MG HALF TABLET
2.5000 mg | ORAL_TABLET | Freq: Two times a day (BID) | ORAL | Status: DC
  Administered 2015-08-13 – 2015-08-14 (×2): 2.5 mg via ORAL
  Filled 2015-08-13 (×5): qty 1

## 2015-08-13 NOTE — H&P (Signed)
BH Observation Unit Provider Admission PAA/H&P  Patient Identification: [name] MRN:  [id number] Date of Evaluation:  08/13/2015 Chief Complaint:  Patient states "I am having trouble with depression and daily use of cocaine."  Principal Diagnosis: Major depressive disorder, recurrent severe without psychotic features (HCC) Diagnosis:   Patient Active Problem List   Diagnosis Date Noted  . Cocaine abuse [F14.10] 08/12/2015  . Cocaine dependence (HCC) [F14.20] 09/04/2013  . Major depressive disorder, recurrent severe without psychotic features (HCC) [F33.2] 09/03/2013  . Cocaine abuse, unspecified [F14.10] 09/13/2011  . 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 Pain, wrist, right [M25.531] 07/22/2011  . History of pulmonary embolism [Z86.711] 07/22/2011  . BPH (benign prostatic hyperplasia) [N40.0] 07/22/2011  . Obstructive uropathy [N13.9] 07/22/2011  . HTN (hypertension) [I10] 07/22/2011  . History of DVT of lower extremity [Z86.718] 07/22/2011  . Former cigar smoker [Z87.891] 07/22/2011  . 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 Noncompliance w/medication treatment due to intermit use of medication [Z91.14]   . Pulmonary embolism, bilateral (HCC) [I26.99] 01/12/2011  . DVT of lower limb, acute (HCC) [I82.409] 01/12/2011  . Sinusitis acute [J01.90] 01/12/2011   History of Present Illness:   [name] is a 50 year old male who presented to [facility] reporting addiction to cocaine. The patient was initially provided with outpatient resources for substance abuse but then returned reporting suicidal ideation with a plan. He went then sent for medical clearance and admitted to the [facility] Unit. According to chart review the patient was last inpatient on the adult unit in June of 2015. He has been to [facility] and [facility] in the past. Patient appears depressed during the assessment today. Carrell stated "I have a lot of anxiety. I have stress at work. My baby girl  was born premature and had trouble breathing. I relapsed on cocaine last month. I am still having some thoughts of self harm off an on. I have a job. I need to think about whether I will pursue inpatient or outpatient treatment. I am not feeling good today. I have no energy and very poor focus. The medication Cymbalta was helpful in the past but I stopped taking it because I felt better."   Associated Signs/Symptoms: Depression Symptoms:  depressed mood, anhedonia, insomnia, psychomotor retardation, fatigue, feelings of worthlessness/guilt, difficulty concentrating, hopelessness, recurrent thoughts of death, suicidal thoughts with specific plan, anxiety, (Hypo) Manic Symptoms:  Irritable Mood, Anxiety Symptoms:  Excessive Worry, Psychotic Symptoms:  Denies PTSD Symptoms: Negative Total Time spent with patient: 45 minutes  Past Psychiatric History: MDD, Cocaine Dependence   Is the patient at risk to self? Yes.    Has the patient been a risk to self in the past 6 months? Yes.    Has the patient been a risk to self within the distant past? No.  Is the patient a risk to others? No.  Has the patient been a risk to others in the past 6 months? No.  Has the patient been a risk to others within the distant past? No.   Prior Inpatient Therapy: Prior Inpatient Therapy: Yes Prior Therapy Dates: unknown Prior Therapy Facilty/Provider(s): [facility] Reason for Treatment: depression, cocaine use Prior Outpatient Therapy: Prior Outpatient Therapy: No Prior Therapy Dates: NA Prior Therapy Facilty/Provider(s): NA Reason for Treatment: NA Does patient have an ACCT team?: No Does patient have Intensive In-House Services?  : No Does patient have [facility] services? : No Does patient have P4CC services?: No  Alcohol Screening: 1. How often do you  have a drink containing alcohol?: Never 2. How many drinks containing alcohol do you have on a typical day when you are drinking?: 1 or 2 3. How often do you  have six or more drinks on one occasion?: Never Preliminary Score: 0 4. How often during the last year have you found that you were not able to stop drinking once you had started?: Never 5. How often during the last year have you failed to do what was normally expected from you becasue of drinking?: Never 6. How often during the last year have you needed a first drink in the morning to get yourself going after a heavy drinking session?: Never 7. How often during the last year have you had a feeling of guilt of remorse after drinking?: Never 8. How often during the last year have you been unable to remember what happened the night before because you had been drinking?: Never 9. Have you or someone else been injured as a result of your drinking?: No 10. Has a relative or friend or a doctor or another health worker been concerned about your drinking or suggested you cut down?: No Alcohol Use Disorder Identification Test Final Score (AUDIT): 0 Brief Intervention: AUDIT score less than 7 or less-screening does not suggest unhealthy drinking-brief intervention not indicated Substance Abuse History in the last 12 months:  Yes.   Consequences of Substance Abuse: Worsening of mental health symptoms such as depression Previous Psychotropic Medications: Yes  Psychological Evaluations: No  Past Medical History:  Past Medical History  Diagnosis Date  . DVT (deep venous thrombosis) (HCC) 01/08/2011    lt leg  . DVT of lower limb, acute (HCC) 01/12/2011  . BPH (benign prostatic hyperplasia)   . Current use of long term anticoagulation   . 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 Noncompliance w/medication treatment due to intermit use of medication   . History of tobacco use     QUIT early 2013  . Hypertension   . Depression   . Enlarged prostate   . Diabetes mellitus without complication [facility](HCC)     Past Surgical History  Procedure Laterality Date   . Tonsillectomy    . Bilateral upper extremity angiogram N/A 07/23/2011    Procedure: BILATERAL UPPER EXTREMITY ANGIOGRAM;  Surgeon: Fransisco HertzBrian L Chen, MD;  [facility];  Service: Cardiovascular;  Laterality: N/A;   Family History: History reviewed. No pertinent family history. Family Psychiatric History: Reports depression in brother and mother Tobacco Screening: Denies Social History:  History  Alcohol Use No    Comment: no alcohol since 09/01/13     History  Drug Use No    Comment: last use 09/01/13    Additional Social History: Marital status: Married    Pain Medications: Pt denies  Prescriptions: see PTA med list Over the Counter: Pt denies  History of alcohol / drug use?: Yes Longest period of sobriety (when/how long): unknown Negative Consequences of Use: Personal relationships, Financial Name of Substance 1: crack cocaine 1 - Age of First Use: unknown 1 - Amount (size/oz): $200 1 - Frequency: daily 1 - Duration: ongoing 1 - Last Use / Amount: 08/12/15                  Allergies:  No Known Allergies Lab Results:  Results for orders placed or performed during the hospital encounter of 08/12/15 (from the past 48 hour(s))  Glucose, capillary  Status: Abnormal   Collection Time: 08/12/15  8:51 PM  Result Value Ref Range   Glucose-Capillary 325 (H) 65 - 99 mg/dL  Glucose, capillary     Status: Abnormal   Collection Time: 08/13/15  6:32 AM  Result Value Ref Range   Glucose-Capillary 276 (H) 65 - 99 mg/dL  Glucose, capillary     Status: Abnormal   Collection Time: 08/13/15 11:49 AM  Result Value Ref Range   Glucose-Capillary 273 (H) 65 - 99 mg/dL   Comment 1 Notify RN    Comment 2 Document in Chart     Blood Alcohol level:  Lab Results  Component Value Date   ETH <5 08/12/2015   ETH <11 09/02/2013    Metabolic Disorder Labs:  No results found for: HGBA1C, MPG No results found for: PROLACTIN No results found for: CHOL, TRIG, HDL, CHOLHDL, VLDL,  LDLCALC  Current Medications: Current Facility-Administered Medications  Medication Dose Route Frequency Provider Last Rate Last Dose  . acetaminophen (TYLENOL) tablet 650 mg  650 mg Oral Q6H PRN Thermon Leyland, NP   650 mg at 08/13/15 1015  . alum & mag hydroxide-simeth (MAALOX/MYLANTA) 200-200-20 MG/5ML suspension 30 mL  30 mL Oral Q4H PRN Thermon Leyland, NP      . DULoxetine (CYMBALTA) DR capsule 30 mg  30 mg Oral Daily Thermon Leyland, NP   30 mg at 08/13/15 1241  . glipiZIDE (GLUCOTROL) tablet 2.5 mg  2.5 mg Oral BID AC Thermon Leyland, NP      . hydrOXYzine (ATARAX/VISTARIL) tablet 25 mg  25 mg Oral Q6H PRN Thermon Leyland, NP   25 mg at 08/12/15 1925  . insulin aspart (novoLOG) injection 0-15 Units  0-15 Units Subcutaneous TID WC Thermon Leyland, NP   8 Units at 08/13/15 1158  . insulin glargine (LANTUS) injection 20 Units  20 Units Subcutaneous QHS Thermon Leyland, NP      . magnesium hydroxide (MILK OF MAGNESIA) suspension 30 mL  30 mL Oral Daily PRN Thermon Leyland, NP      . rivaroxaban Carlena Hurl) tablet 20 mg  20 mg Oral Q supper Thermon Leyland, NP      . traZODone (DESYREL) tablet 50 mg  50 mg Oral QHS PRN Thermon Leyland, NP       PTA Medications: Prescriptions prior to admission  Medication Sig Dispense Refill Last Dose  . acetaminophen (TYLENOL) 650 MG CR tablet Take 1,300-1,950 mg by mouth 2 (two) times daily as needed for pain.    08/09/2015  . benzonatate (TESSALON) 100 MG capsule Take 1 capsule (100 mg total) by mouth 3 (three) times daily as needed for cough. (Patient not taking: Reported on 08/12/2015) 10 capsule 0 Completed Course at Unknown time  . Cyanocobalamin (B-12 PO) Take 1 tablet by mouth daily.   Past Week at Unknown time  . fluticasone (FLONASE) 50 MCG/ACT nasal spray Place 1 spray into both nostrils daily. (Patient taking differently: Place 1 spray into both nostrils daily as needed for allergies. ) 16 g 0 unknown  . glipiZIDE (GLUCOTROL) 5 MG tablet Take 5 mg by mouth 2 (two)  times daily before a meal.   08/09/2015  . HYDROcodone-acetaminophen (NORCO) 5-325 MG tablet Take 1 tablet by mouth every 6 (six) hours as needed. 10 tablet 0 hasnt started  . insulin glargine (LANTUS) 100 UNIT/ML injection Inject 50 Units into the skin daily.    08/09/2015  . levofloxacin (LEVAQUIN) 500 MG tablet  Take 1 tablet (500 mg total) by mouth daily. 10 tablet 0 Past Week at Unknown time  . lisinopril (PRINIVIL,ZESTRIL) 20 MG tablet Take 20 mg by mouth daily.   08/09/2015  . metFORMIN (GLUCOPHAGE-XR) 750 MG 24 hr tablet Take 750 mg by mouth daily with breakfast.   08/09/2015  . Multiple Vitamin (MULTIVITAMIN WITH MINERALS) TABS tablet Take 1 tablet by mouth daily.   08/09/2015  . Multiple Vitamins-Minerals (MULTIVITAMIN WITH MINERALS) tablet Take 1 tablet by mouth daily. 30 tablet 0 Past Week at Unknown time  . pravastatin (PRAVACHOL) 20 MG tablet Take 20 mg by mouth daily.    08/09/2015  . rivaroxaban (XARELTO) 20 MG TABS tablet Take 1 tablet (20 mg total) by mouth daily with supper. (Patient taking differently: Take 20 mg by mouth daily. ) 30 tablet 0 Past Week at Unknown time  . tamsulosin (FLOMAX) 0.4 MG CAPS capsule Take 1 capsule (0.4 mg total) by mouth at bedtime. 30 capsule 0 08/09/2015  . traMADol (ULTRAM) 50 MG tablet Take 50 mg by mouth every 6 (six) hours as needed for moderate pain.   08/07/2015    Musculoskeletal: Strength & Muscle Tone: within normal limits Gait & Station: normal Patient leans: N/A  Psychiatric Specialty Exam: Physical Exam  Review of Systems  Constitutional: Positive for malaise/fatigue.  Psychiatric/Behavioral: Positive for depression, suicidal ideas and substance abuse. Negative for hallucinations and memory loss. The patient is nervous/anxious and has insomnia.     Blood pressure 118/71, pulse 60, temperature 97.8 F (36.6 C), temperature source Oral, resp. rate 16, height 5\' 8"  (1.727 m), weight 92.08 kg (203 lb), SpO2 98 %.Body mass index is 30.87 kg/(m^2).   General Appearance: Casual  Eye Contact:  Fair  Speech:  Clear and Coherent and Slow  Volume:  Decreased  Mood:  Dysphoric  Affect:  Constricted  Thought Process:  Coherent and Goal Directed  Orientation:  Full (Time, Place, and Person)  Thought Content:  Symptoms, worries, concerns  Suicidal Thoughts:  Yes.  with intent/plan  Homicidal Thoughts:  No  Memory:  Immediate;   Good Recent;   Good Remote;   Good  Judgement:  Impaired  Insight:  Shallow  Psychomotor Activity:  Decreased  Concentration:  Concentration: Good and Attention Span: Good  Recall:  [employer] of Knowledge:  Fair  Language:  Good  Akathisia:  No  Handed:  Right  AIMS (if indicated):     Assets:  Communication Skills Desire for Improvement Leisure Time Physical Health Resilience  ADL's:  Intact  Cognition:  WNL  Sleep:         Treatment Plan Summary: Daily contact with patient to assess and evaluate symptoms and progress in treatment and Medication management  Observation Level/Precautions:  Continuous Observation Laboratory:  CBC Chemistry Profile CBG monitoring  for Diabetes  Psychotherapy:  Individual  Medications:  Start Cymbalta 30 mg daily for depression Consultations:  None Discharge Concerns:  Continued substance abuse  Estimated LOS: 24-48 hours Other:  Referrals to be sent for substance abuse treatment   Perlie Scheuring, NP 6/5/20174:00 PM

## 2015-08-13 NOTE — Progress Notes (Signed)
D: Patient remains in OBS unit.  He c/o anxiety and has a slight tremor.  Affect flat.  He also c/o headache and was medicated for same (see MAR).  He states he has passive suicidal ideation that is "off and on"; contracts for safety; he denies homicidal ideation. He states at times he feels as if he is being watched A: Emotional support and encouragement given; patient instructed to alert staff with needs/concerns. R: Remains safe on OBS unit

## 2015-08-13 NOTE — Progress Notes (Signed)
D: Patient complained of headache of 8/10. Staff offered Acetaminophen 650 mg as ordered with good effect. Patient denies SI, AH/VH at this time. Sleeping on/off and talking/laughing while sleeping. No further complaint. A: Staff offered support and encouragement. Due meds given as ordered. Constant monitoring maintained for safety.  R: Patient remains safe and appropriate.

## 2015-08-13 NOTE — Progress Notes (Signed)
Patient appeared very sad and depressed. He complaint of headache and rated the pain on his head at 9 on a scale of 1-10 with 10 the worst.  Writer administered Tylenol 650 mg to patient. Offered him some Gatorade. He denied SI/HI and denied Hallucinations Encouraged and supported patient. Safety maintained.

## 2015-08-13 NOTE — Progress Notes (Signed)
This Clinical research associatewriter received a call from the patient's wife [name] [phone]  wondering if the [facility].  This Clinical research associatewriter informed her because of confidentiality this information could not be disclosed but if the patient is here a message will be passed for him to return her call.     This Clinical research associatewriter [facility] the [facility] to inform the patient to call his wife because the wife reported his daughter is in the hospital.  The nursing staff agreed to pass the message.      Maryelizabeth Rowanressa Taralyn Ferraiolo, MSW, Clare CharonLCSW, LCAS [facility] Triage Specialist [phone]

## 2015-08-14 DIAGNOSIS — G4710 Hypersomnia, unspecified: Secondary | ICD-10-CM | POA: Diagnosis present

## 2015-08-14 DIAGNOSIS — I10 Essential (primary) hypertension: Secondary | ICD-10-CM | POA: Diagnosis present

## 2015-08-14 DIAGNOSIS — R45851 Suicidal ideations: Secondary | ICD-10-CM | POA: Diagnosis present

## 2015-08-14 DIAGNOSIS — Z86711 Personal history of pulmonary embolism: Secondary | ICD-10-CM | POA: Diagnosis not present

## 2015-08-14 DIAGNOSIS — G4700 Insomnia, unspecified: Secondary | ICD-10-CM | POA: Diagnosis present

## 2015-08-14 DIAGNOSIS — F1420 Cocaine dependence, uncomplicated: Secondary | ICD-10-CM | POA: Diagnosis not present

## 2015-08-14 DIAGNOSIS — F411 Generalized anxiety disorder: Secondary | ICD-10-CM | POA: Diagnosis present

## 2015-08-14 DIAGNOSIS — E119 Type 2 diabetes mellitus without complications: Secondary | ICD-10-CM | POA: Diagnosis present

## 2015-08-14 DIAGNOSIS — F332 Major depressive disorder, recurrent severe without psychotic features: Secondary | ICD-10-CM | POA: Diagnosis present

## 2015-08-14 DIAGNOSIS — Z87891 Personal history of nicotine dependence: Secondary | ICD-10-CM | POA: Diagnosis not present

## 2015-08-14 DIAGNOSIS — Z833 Family history of diabetes mellitus: Secondary | ICD-10-CM | POA: Diagnosis not present

## 2015-08-14 DIAGNOSIS — Z818 Family history of other mental and behavioral disorders: Secondary | ICD-10-CM | POA: Diagnosis not present

## 2015-08-14 DIAGNOSIS — F1410 Cocaine abuse, uncomplicated: Secondary | ICD-10-CM | POA: Diagnosis not present

## 2015-08-14 DIAGNOSIS — Z7901 Long term (current) use of anticoagulants: Secondary | ICD-10-CM | POA: Diagnosis not present

## 2015-08-14 DIAGNOSIS — Z86718 Personal history of other venous thrombosis and embolism: Secondary | ICD-10-CM | POA: Diagnosis not present

## 2015-08-14 DIAGNOSIS — Z794 Long term (current) use of insulin: Secondary | ICD-10-CM | POA: Diagnosis not present

## 2015-08-14 LAB — GLUCOSE, CAPILLARY
GLUCOSE-CAPILLARY: 198 mg/dL — AB (ref 65–99)
GLUCOSE-CAPILLARY: 213 mg/dL — AB (ref 65–99)
GLUCOSE-CAPILLARY: 303 mg/dL — AB (ref 65–99)
Glucose-Capillary: 200 mg/dL — ABNORMAL HIGH (ref 65–99)

## 2015-08-14 MED ORDER — ACETAMINOPHEN 325 MG PO TABS
650.0000 mg | ORAL_TABLET | Freq: Four times a day (QID) | ORAL | Status: DC | PRN
  Administered 2015-08-14 – 2015-08-19 (×7): 650 mg via ORAL
  Filled 2015-08-14 (×7): qty 2

## 2015-08-14 MED ORDER — ALUM & MAG HYDROXIDE-SIMETH 200-200-20 MG/5ML PO SUSP: 30.0000 mL | ORAL | Status: DC | PRN

## 2015-08-14 MED ORDER — INSULIN GLARGINE 100 UNIT/ML ~~LOC~~ SOLN: 22.0000 [IU] | Freq: Every day | SUBCUTANEOUS | Status: DC

## 2015-08-14 MED ORDER — GLIPIZIDE 5 MG PO TABS
5.0000 mg | ORAL_TABLET | Freq: Two times a day (BID) | ORAL | Status: DC
  Administered 2015-08-14 – 2015-08-19 (×10): 5 mg via ORAL
  Filled 2015-08-14 (×5): qty 1
  Filled 2015-08-14: qty 14
  Filled 2015-08-14 (×3): qty 1
  Filled 2015-08-14 (×2): qty 14
  Filled 2015-08-14 (×2): qty 1
  Filled 2015-08-14: qty 14
  Filled 2015-08-14 (×3): qty 1

## 2015-08-14 MED ORDER — INSULIN GLARGINE 100 UNIT/ML ~~LOC~~ SOLN
25.0000 [IU] | Freq: Every day | SUBCUTANEOUS | Status: DC
  Administered 2015-08-14: 25 [IU] via SUBCUTANEOUS

## 2015-08-14 MED ORDER — QUETIAPINE FUMARATE 100 MG PO TABS
100.0000 mg | ORAL_TABLET | Freq: Every day | ORAL | Status: DC
  Administered 2015-08-14: 100 mg via ORAL
  Filled 2015-08-14 (×4): qty 1

## 2015-08-14 MED ORDER — MAGNESIUM HYDROXIDE 400 MG/5ML PO SUSP: 30.0000 mL | Freq: Every day | ORAL | Status: DC | PRN

## 2015-08-14 NOTE — Tx Team (Signed)
[name] is a 50 y.o. male [facility]. Pt states he is in need of inpatient treatment from [facility]. Pt reports feeling depressed due to his baby recently being born prematurely. Pt denies SI/HI. He denies current outpatient treatment. Pt denies current mental health medication. He admits to using $200 of crack cocaine a day. Pt left [facility] and returned. When the Pt returned he stated that he would run his car off a bridge.  He continues to voice passive suicidal ideation but is able to contract for safety.  He denies HI or A/V hallucinations.  He is diagnosed with Opiod use disorder, severe and Major Depressive disorder, moderate.  Admission paperwork completed and signed.  Belongings searched and secured in locker # 47 see belongings sheet in paper chart.  Skin assessment completed and noted left lower leg scar and right knee scar.  Q 15 minute checks initiated for safety.  We will monitor the progress towards his goals.

## 2015-08-14 NOTE — Progress Notes (Signed)
Inpatient Diabetes Program Recommendations  AACE/ADA: New Consensus Statement on Inpatient Glycemic Control (2015)  Target Ranges:  Prepandial:   less than 140 mg/dL      Peak postprandial:   less than 180 mg/dL (1-2 hours)      Critically ill patients:  140 - 180 mg/dL   Results for [name], [name] (MRN [id number]) as of 08/14/2015 10:24  Ref. Range 08/13/2015 06:32 08/13/2015 11:49 08/13/2015 16:49 08/13/2015 19:55  Glucose-Capillary Latest Ref Range: 65-99 mg/dL 914276 (H) 782273 (H) 956196 (H) 184 (H)   Results for [name], [name] (MRN [id number]) as of 08/14/2015 10:24  Ref. Range 08/14/2015 06:00  Glucose-Capillary Latest Ref Range: 65-99 mg/dL 469200 (H)    Admit with: Depression/ Daily Cocaine Use  History: DM  Home DM Meds: Lantus 50 units daily       Glipizide 5 mg bid       Metformin 750 mg daily  Current Insulin Orders: Lantus 20 units QHS      Glipizide 2.5 mg bid      Novolog Moderate Correction Scale/ SSI (0-15 units) TID AC     MD- Please consider the following in-hospital DM medication adjustments:  1. Increase Glipizide to 5 mg bid (home dose)  2. Increase Lantus to 25 units QHS (50% of home dose)      --Will follow patient during hospitalization--  Ambrose FinlandJeannine Johnston Alexzandrea Normington RN, MSN, CDE Diabetes Coordinator Inpatient Glycemic Control Team Team Pager: [phone] (8a-5p)

## 2015-08-14 NOTE — Progress Notes (Signed)
Adult Psychoeducational Group Note  Date:  08/14/2015 Time:  8:25 PM  Group Topic/Focus:  Wrap-Up Group:   The focus of this group is to help patients review their daily goal of treatment and discuss progress on daily workbooks.  Participation Level:  Active  Participation Quality:  Appropriate  Affect:  Depressed  Cognitive:  Appropriate  Insight: Appropriate  Engagement in Group:  Engaged  Modes of Intervention:  Discussion  Additional Comments:  Pt was pleasant during wrap-up group, however pt seemed slightly depressed. Pt rated his overall day a 4 out of 10 and stated that his day was "a day". Pt reported that he did not have a goal for the day.   Cleotilde NeerJasmine S Saphire Barnhart 08/14/2015, 8:42 PM

## 2015-08-14 NOTE — Progress Notes (Signed)
Patient ID: Gilman ButtnerJames L Markham, male   DOB: 04-21-1965, 50 y.o.   MRN: [id number]  Patient admitted yesterday to the [facility]. He appeared very depressed and withdrawn during his assessment today. The patient is noted to make poor eye contact with Clinical research associatewriter. Patient stated "I feel like crap mentally. I hear people talking. They say that they want to beat me up. I still have some suicidal thoughts like I was having before. My depression is terrible. I just want to give up." [name] reports that seroquel was helpful to him in the past to help stabilize his mood. Patient will continue on Cymbalta 30 mg daily for depression and will initiate Seroquel 100 mg at bedtime. [name] reports that he would be unable to contract for his safety at this time if he were to leave the hospital. Patient will be admitted to the 300 hall for further treatment and management.

## 2015-08-14 NOTE — Progress Notes (Signed)
D: Pt A & O X 4. Pt presented with flat affect and depressed mood on initial contact. Pt reported some paranoid thoughts / ideation;  told Clinical research associatewriter "I hear people talking  about me and wants to beat me up" when assessed this AM. Observed to be asleep at long intervals during shift without self harm gestures or outburst to note thus far. [facility].  A: Scheduled and PRN medications administered as prescribed with verbal education. Emotional support, availability and encouragement provided to pt. Writer informed pt about updates to his care (transfer order and medication changes). Report called to receiving nurse, Herbert SetaHeather, RN on adult unit. Safety maintained in observations unit without incident to note thus far.   R: Pt reported passive SI without plan when assessed. Verbally contracted for safety. Denies HI, AVH and pain at the time. Compliant with medications when offered as well as CBG monitoring when approached. Appears to be in physical distress at this time. Will continue to monitor pt for safety and stabilization.

## 2015-08-14 NOTE — Tx Team (Signed)
Initial Interdisciplinary Treatment Plan   PATIENT STRESSORS: Health problems Substance abuse   PATIENT STRENGTHS: Capable of independent living Communication skills   PROBLEM LIST: Problem List/Patient Goals Date to be addressed Date deferred Reason deferred Estimated date of resolution  Depression 08/14/15     Substance abuse 08/14/15     Anxiety  08/14/15     "Help with my drug problem" 08/14/15     "Help me with my anxiety and depression" 08/14/15                              DISCHARGE CRITERIA:  Improved stabilization in mood, thinking, and/or behavior Verbal commitment to aftercare and medication compliance  PRELIMINARY DISCHARGE PLAN: Outpatient therapy Medication management  PATIENT/FAMIILY INVOLVEMENT: This treatment plan has been presented to and reviewed with the patient, [name]  The patient and family have been given the opportunity to ask questions and make suggestions.  [name] 08/14/2015, 6:44 PM

## 2015-08-15 ENCOUNTER — Encounter (HOSPITAL_COMMUNITY): Payer: Self-pay | Admitting: Psychiatry

## 2015-08-15 DIAGNOSIS — F1420 Cocaine dependence, uncomplicated: Secondary | ICD-10-CM | POA: Diagnosis present

## 2015-08-15 LAB — GLUCOSE, CAPILLARY
GLUCOSE-CAPILLARY: 185 mg/dL — AB (ref 65–99)
GLUCOSE-CAPILLARY: 246 mg/dL — AB (ref 65–99)
Glucose-Capillary: 240 mg/dL — ABNORMAL HIGH (ref 65–99)
Glucose-Capillary: 200 mg/dL — ABNORMAL HIGH (ref 65–99)
Glucose-Capillary: 207 mg/dL — ABNORMAL HIGH (ref 65–99)

## 2015-08-15 MED ORDER — INSULIN GLARGINE 100 UNIT/ML ~~LOC~~ SOLN
35.0000 [IU] | Freq: Every day | SUBCUTANEOUS | Status: DC
  Administered 2015-08-15 – 2015-08-18 (×4): 35 [IU] via SUBCUTANEOUS

## 2015-08-15 MED ORDER — LEVOFLOXACIN 500 MG PO TABS
500.0000 mg | ORAL_TABLET | Freq: Every day | ORAL | Status: DC
  Administered 2015-08-15 – 2015-08-19 (×5): 500 mg via ORAL
  Filled 2015-08-15: qty 3
  Filled 2015-08-15 (×4): qty 1
  Filled 2015-08-15: qty 3
  Filled 2015-08-15: qty 1
  Filled 2015-08-15: qty 2
  Filled 2015-08-15: qty 1

## 2015-08-15 MED ORDER — PRAVASTATIN SODIUM 20 MG PO TABS
20.0000 mg | ORAL_TABLET | Freq: Every day | ORAL | Status: DC
  Administered 2015-08-15: 20 mg via ORAL
  Filled 2015-08-15 (×3): qty 1

## 2015-08-15 MED ORDER — QUETIAPINE FUMARATE 50 MG PO TABS
50.0000 mg | ORAL_TABLET | Freq: Every day | ORAL | Status: DC
  Administered 2015-08-15 – 2015-08-19 (×4): 50 mg via ORAL
  Filled 2015-08-15: qty 7
  Filled 2015-08-15 (×2): qty 1
  Filled 2015-08-15: qty 7
  Filled 2015-08-15 (×3): qty 1

## 2015-08-15 MED ORDER — FLUTICASONE PROPIONATE 50 MCG/ACT NA SUSP
1.0000 | Freq: Every day | NASAL | Status: DC | PRN
  Filled 2015-08-15: qty 16

## 2015-08-15 MED ORDER — METFORMIN HCL ER 750 MG PO TB24
750.0000 mg | ORAL_TABLET | Freq: Every day | ORAL | Status: DC
  Administered 2015-08-16 – 2015-08-19 (×4): 750 mg via ORAL
  Filled 2015-08-15: qty 3
  Filled 2015-08-15: qty 1
  Filled 2015-08-15: qty 3
  Filled 2015-08-15 (×4): qty 1

## 2015-08-15 MED ORDER — TAMSULOSIN HCL 0.4 MG PO CAPS
0.4000 mg | ORAL_CAPSULE | Freq: Every day | ORAL | Status: DC
  Administered 2015-08-15 – 2015-08-18 (×4): 0.4 mg via ORAL
  Filled 2015-08-15 (×2): qty 1
  Filled 2015-08-15: qty 3
  Filled 2015-08-15: qty 1
  Filled 2015-08-15: qty 3
  Filled 2015-08-15 (×2): qty 1

## 2015-08-15 NOTE — Tx Team (Signed)
Interdisciplinary Treatment Plan Update (Adult)  Date:  08/15/2015  Time Reviewed:  9:57 AM   Progress in Treatment: Attending groups: No. New to unit. Continuing to assess.  Participating in groups:  No. Taking medication as prescribed:  Yes. Tolerating medication:  Yes. Family/Significant othe contact made:  SPE required for this pt.  Patient understands diagnosis:  Yes. and As evidenced by:  seeking treatment for Discussing patient identified problems/goals with staff:  Yes. Medical problems stabilized or resolved:  Yes. Denies suicidal/homicidal ideation: Yes. Issues/concerns per patient self-inventory:  Other:  Discharge Plan or Barriers: CSW assessing for appropriate referrals.   Reason for Continuation of Hospitalization: Depression Medication stabilization Withdrawal symptoms  Comments:  [name] is an 50 y.o. male. Pt arrived to [facility] reporting crack cocaine addiction. Pt states he is in need of inpatient treatment. Pt reports feeling depressed due to his baby recently being born prematurely. Pt denies SI/HI. Pt denies AVH. Pt reports previous hospitalizations at the [facility] and [facility] for SA and depression. Pt denies current outpatient treatment. Pt denies current mental health medication. Pt states he uses $200 of crack cocaine a day.  Pt left [facility] and returned. When the Pt returned he stated that he would run his car off a bridge. Diagnosis:  Opiod use disorder, severe; F33.1 MDD, moderate  Estimated length of stay:  3-5 days   New goal(s): to develop effective aftercare plan.   Additional Comments:  Patient and CSW reviewed pt's identified goals and treatment plan. Patient verbalized understanding and agreed to treatment plan. CSW reviewed [facility] "Discharge Process and Patient Involvement" Form. Pt verbalized understanding of information provided and signed form.    Review of initial/current patient goals per problem list:  1. Goal(s): Patient will participate in aftercare  plan  Met: No.   Target date: at discharge  As evidenced by: Patient will participate within aftercare plan AEB aftercare provider and housing plan at discharge being identified.  6/7: Pt last admitted to [facility] in 2015 and had been following up with [facility]. No current providers.   2. Goal (s): Patient will exhibit decreased depressive symptoms and suicidal ideations.  Met: No.    Target date: at discharge  As evidenced by: Patient will utilize self rating of depression at 3 or below and demonstrate decreased signs of depression or be deemed stable for discharge by MD.  6/7: Pt rates depression as high. Denies SI/HI/AVH today.   3. Goal(s): Patient will demonstrate decreased signs of withdrawal due to substance abuse  Met:No.   Target date:at discharge   As evidenced by: Patient will produce a CIWA/COWS score of 0, have stable vitals signs, and no symptoms of withdrawal.  6/7: Pt reports mild withdrawals with no COWS score and stable vitals.   Attendees: Patient:   08/15/2015 9:57 AM   Family:   08/15/2015 9:57 AM   Physician:  Dr. Shea Evans; Dr. Parke Poisson MD  08/15/2015 9:57 AM   Nursing:   Ashok Pall RN 08/15/2015 9:57 AM   Clinical Social Worker: Maxie Better, LCSW 08/15/2015 9:57 AM   Clinical Social Worker: Erasmo Downer Drinkard LCSW; Peri Maris LCSWA 08/15/2015 9:57 AM   Other:  Gerline Legacy Nurse Case Manager 08/15/2015 9:57 AM   Other:  Agustina Caroli NP 08/15/2015 9:57 AM   Other:   08/15/2015 9:57 AM   Other:  08/15/2015 9:57 AM   Other:  08/15/2015 9:57 AM   Other:  08/15/2015 9:57 AM    08/15/2015 9:57 AM  08/15/2015 9:57 AM    08/15/2015 9:57 AM    08/15/2015 9:57 AM    Scribe for Treatment Team:   Maxie Better, LCSW 08/15/2015 9:57 AM

## 2015-08-15 NOTE — Progress Notes (Signed)
Pt did not attend evening NA group. Pt was asleep.  

## 2015-08-15 NOTE — Progress Notes (Signed)
Pt was observed at the beginning of the shift in the dayroom watching TV with minimal interaction with his peers.  He reports that he was transferred to the unit from the Obs unit.  He states he is still having suicidal thoughts, but he can contract for safety.  He denies HI/AVH.  He does not report having any withdrawal symptoms at this time.  Pt states he would like long term [facility].  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  Pt has been pleasant and cooperative.  He makes his needs known to staff.  He c/o a headache at the beginning of the shift.  He says it has been ongoing for two days.  He was given Tylenol 650 mg for the HA.  His CBG was checked at that time and was found to be 303.  He says that is high for him.  He has Lantus 25 units scheduled at bedtime, but states he usually takes 50 units at home.  He says he has been trying to watch what he eats.  Writer will ask the next shift to re-address pt's diabetic meds in the morning.  Support and encouragement offered.  Discharge plans are in process.  Safety maintained with q15 minute checks.

## 2015-08-15 NOTE — Progress Notes (Addendum)
Inpatient Diabetes Program Recommendations  AACE/ADA: New Consensus Statement on Inpatient Glycemic Control (2015)  Target Ranges:  Prepandial:   less than 140 mg/dL      Peak postprandial:   less than 180 mg/dL (1-2 hours)      Critically ill patients:  140 - 180 mg/dL   Results for [name], [name] (MRN [id number]) as of 08/15/2015 08:22  Ref. Range 08/14/2015 06:00 08/14/2015 12:09 08/14/2015 16:52 08/14/2015 20:05  Glucose-Capillary Latest Ref Range: 65-99 mg/dL 409200 (H) 811198 (H) 914213 (H) 303 (H)   Results for [name], [name] (MRN [id number]) as of 08/15/2015 08:22  Ref. Range 08/15/2015 06:24  Glucose-Capillary Latest Ref Range: 65-99 mg/dL 086185 (H)    Admit with: Depression/ Daily Cocaine Use  History: DM  Home DM Meds: Lantus 50 units daily  Glipizide 5 mg bid  Metformin 750 mg daily  Current Insulin Orders: Lantus 25 units QHS  Glipizide 5 mg bid  Novolog Moderate Correction Scale/ SSI (0-15 units) TID AC     Note Glipizide increased to 5 mg bid last PM.  Also note Lantus increased to 25 units QHS last PM as well.    MD- Please consider the following in-hospital DM medication adjustments:  Increase Lantus to 35 units QHS (~75% home dose)    --Will follow patient during hospitalization--  Ambrose FinlandJeannine Johnston Dalonda Simoni RN, MSN, CDE Diabetes Coordinator Inpatient Glycemic Control Team Team Pager: [phone] (8a-5p)

## 2015-08-15 NOTE — Plan of Care (Signed)
Problem: Safety: Goal: Ability to disclose and discuss suicidal ideas will improve Outcome: Progressing Patient reports passive SI, contracts for safety.

## 2015-08-15 NOTE — [facility] Suicide Risk Assessment (Signed)
[facility] Admission Suicide Risk Assessment   Nursing information obtained from:  Patient Demographic factors:  Male Current Mental Status:  Self-harm thoughts Loss Factors:  NA Historical Factors:  Prior suicide attempts Risk Reduction Factors:  Living with another person, especially a relative, Positive social support  Total Time spent with patient: 30 minutes Principal Problem: Major depressive disorder, recurrent severe without psychotic features (HCC) Diagnosis:   Patient Active Problem List   Diagnosis Date Noted  . Cocaine use disorder, severe, dependence (HCC) [F14.20] 08/15/2015  . Major depressive disorder, recurrent severe without psychotic features (HCC) [F33.2] 09/03/2013  . 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 Pain, wrist, right [M25.531] 07/22/2011  . History of pulmonary embolism [Z86.711] 07/22/2011  . BPH (benign prostatic hyperplasia) [N40.0] 07/22/2011  . Obstructive uropathy [N13.9] 07/22/2011  . HTN (hypertension) [I10] 07/22/2011  . History of DVT of lower extremity [Z86.718] 07/22/2011  . Former cigar smoker [Z87.891] 07/22/2011  . 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 Noncompliance w/medication treatment due to intermit use of medication [Z91.14]   . Pulmonary embolism, bilateral (HCC) [I26.99] 01/12/2011  . DVT of lower limb, acute (HCC) [I82.409] 01/12/2011  . Sinusitis acute [J01.90] 01/12/2011   Subjective Data: Pt states " I am depressed and I need help."  Continued Clinical Symptoms:  Alcohol Use Disorder Identification Test Final Score (AUDIT): 0 The "Alcohol Use Disorders Identification Test", Guidelines for Use in Primary Care, Second Edition.  World Science writer [facility]). Score between 0-7:  no or low risk or alcohol related problems. Score between 8-15:  moderate risk of alcohol related problems. Score between 16-19:  high risk of alcohol related problems. Score 20 or above:  warrants further diagnostic  evaluation for alcohol dependence and treatment.   CLINICAL FACTORS:   Depression:   Comorbid alcohol abuse/dependence Alcohol/Substance Abuse/Dependencies   Musculoskeletal: Strength & Muscle Tone: within normal limits Gait & Station: normal Patient leans: N/A  Psychiatric Specialty Exam: Physical Exam  Nursing note and vitals reviewed.   Review of Systems  Psychiatric/Behavioral: Positive for depression and substance abuse. The patient is nervous/anxious.   All other systems reviewed and are negative.   Blood pressure 112/79, pulse 75, temperature 98.2 F (36.8 C), temperature source Oral, resp. rate 12, height  (1.727 m), weight 88.905 kg (196 lb), SpO2 98 %.Body mass index is 29.81 kg/(m^2).  General Appearance: Guarded  Eye Contact:  Poor  Speech:  Slow  Volume:  Normal  Mood:  Depressed  Affect:  Depressed  Thought Process:  Linear and Descriptions of Associations: Intact  Orientation:  Full (Time, Place, and Person)  Thought Content:  Rumination  Suicidal Thoughts:  Yes.  without intent/plan  Homicidal Thoughts:  No  Memory:  Immediate;   Fair Recent;   Fair Remote;   Fair  Judgement:  Impaired  Insight:  Shallow  Psychomotor Activity:  Decreased  Concentration:  Concentration: Poor and Attention Span: Fair  Recall:  [employer] of Knowledge:  Fair  Language:  Fair  Akathisia:  No  Handed:  Right  AIMS (if indicated):     Assets:  Desire for Improvement  ADL's:  Intact  Cognition:  WNL  Sleep:  Number of Hours: 6.75      COGNITIVE FEATURES THAT CONTRIBUTE TO RISK:  Closed-mindedness, Polarized thinking and Thought constriction (tunnel vision)    SUICIDE RISK:   Moderate:  Frequent suicidal ideation with limited intensity, and duration, some specificity in terms of plans, no associated intent, good self-control,  limited dysphoria/symptomatology, some risk factors present, and identifiable protective factors, including available and accessible  social support.  PLAN OF CARE: Patient will benefit from inpatient treatment and stabilization.  Estimated length of stay is 5-7 days.  Reviewed past medical records,treatment plan.  Case discussed with Renata Capriceonrad NP. CSW will start working on disposition.  Patient to participate in therapeutic milieu .       I certify that inpatient services furnished can reasonably be expected to improve the patient's condition.   Joycelin Radloff, MD 08/15/2015, 2:03 PM

## 2015-08-15 NOTE — Progress Notes (Signed)
Patient ID: [name], male   DOB: [birth year], 50 y.o.   MRN: [id number]  EKG completed, shown to MD Eappen and placed on chart.

## 2015-08-15 NOTE — Progress Notes (Signed)
Patient ID: [name], male   DOB: 09-24-1965, 50 y.o.   MRN: [id number]  DAR: Pt. Denies HI and visual Hallucinations. He continues to endorse passive SI but is able to contract for safety at this time. He also reports hearing voices at times. He reports sleep is fair, appetite is good, energy level is low, and concentration is poor. He rates depression, anxiety, and hopelessness 4/10. He reports feeling dizzy throughout the day, MD Eappen notified of this. Patient encouraged to drink plenty of fluids and practice safety when walking and standing. Patient does report generalized pain and some chills. He received PRN Tylenol for pain. Support and encouragement provided to the patient. Scheduled medications administered to patient per physician's orders. Patient is minimal with Clinical research associatewriter but cooperative. He is mainly seen laying in bed throughout the day but can be related to his c/o dizziness. Vital signs continue to be monitored as well as CBG. EKG was performed without distress. Q15 minute checks are maintained for safety.

## 2015-08-15 NOTE — Progress Notes (Signed)
Recreation Therapy Notes  Date: 06.07.2017 Time: 9:30am Location: [address] Group Room   Group Topic: Stress Management  Goal Area(s) Addresses:  Patient will actively participate in stress management techniques presented during session.   Behavioral Response:Did not attend.   Kaily Wragg L Emmitte Surgeon, LRT/CTRS        Henryk Ursin L 08/15/2015 10:25 AM 

## 2015-08-15 NOTE — [facility] Group Notes (Signed)
[facility]   08/15/2015 11:01 AM  Participation Quality:  Invited. DID NOT ATTEND. Pt chose to rest in room.   Smart, Delores Edelstein LCSW

## 2015-08-15 NOTE — H&P (Signed)
Psychiatric Admission Assessment Adult  Patient Identification: [name] MRN:  [id number] Date of Evaluation:  08/15/2015 Chief Complaint:  MDD-recurrent severe without psychotic features cocaine abuse  Principal Diagnosis: Major depressive disorder, recurrent severe without psychotic features (HCC) Diagnosis:   Patient Active Problem List   Diagnosis Date Noted  . MDD (major depressive disorder), recurrent episode, severe (HCC) [F33.2] 08/14/2015  . Cocaine abuse [F14.10] 08/12/2015  . Cocaine dependence (HCC) [F14.20] 09/04/2013  . Major depressive disorder, recurrent severe without psychotic features (HCC) [F33.2] 09/03/2013  . Cocaine abuse, unspecified [F14.10] 09/13/2011  . 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 Pain, wrist, right [M25.531] 07/22/2011  . History of pulmonary embolism [Z86.711] 07/22/2011  . BPH (benign prostatic hyperplasia) [N40.0] 07/22/2011  . Obstructive uropathy [N13.9] 07/22/2011  . HTN (hypertension) [I10] 07/22/2011  . History of DVT of lower extremity [Z86.718] 07/22/2011  . Former cigar smoker [Z87.891] 07/22/2011  . 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 Noncompliance w/medication treatment due to intermit use of medication [Z91.14]   . Pulmonary embolism, bilateral (HCC) [I26.99] 01/12/2011  . DVT of lower limb, acute (HCC) [I82.409] 01/12/2011  . Sinusitis acute [J01.90] 01/12/2011   History of Present Illness: I have reviewed and concur with HPI elements below, modified as follows: [name] is an 50 y.o. male. Pt arrived to [facility] reporting crack cocaine addiction. Pt states he is in need of inpatient treatment. Pt reports feeling depressed due to his baby recently being born prematurely. Pt denies SI/HI. Pt denies AVH. Pt reports previous hospitalizations at the [facility] and [facility] for SA and depression. Pt denies current outpatient treatment. Pt denies current mental health medication. Pt states he uses $200 of crack  cocaine a day.   Today on 08/15/2015 pt seen and chart reviewed for H&P. Pt states that he has been smoking crack "quite a bit" and that is last use was a few days ago. Pt is alert/oriented x4, calm, cooperative, and appropriate to situation. Pt denies suicidal/homicidal ideation and does not appear to be responding to internal stimuli. Pt does report that he has chronic auditory hallucinations (before crack use) 20+ years ago and that he hears people "talking about me" and also thinks they are when he is around others but knows that they are truly not. Pt cites good sleep yet poor appetite, racing thoughts, and severe anxiety/depression.   Associated Signs/Symptoms: Depression Symptoms:  depressed mood, anhedonia, insomnia, hypersomnia, psychomotor agitation, psychomotor retardation, fatigue, feelings of worthlessness/guilt, difficulty concentrating, hopelessness, (Hypo) Manic Symptoms:  Impulsivity, Labiality of Mood, Anxiety Symptoms:  Excessive Worry, Psychotic Symptoms:  chronic auditory hallucinations with feeling as though people are talking about him PTSD Symptoms: denies Total Time spent with patient: 45 minutes  Past Psychiatric History: MDD, GAD, insomnia, cocaine abuse, ETOH abuse, psychosis  Is the patient at risk to self? No  Has the patient been a risk to self in the past 6 months? No Has the patient been a risk to self within the distant past? Yes.    Is the patient a risk to others? No.  Has the patient been a risk to others in the past 6 months? No.  Has the patient been a risk to others within the distant past? No.   Prior Inpatient Therapy: Prior Inpatient Therapy: Yes Prior Therapy Dates: unknown Prior Therapy Facilty/Provider(s): [facility] Reason for Treatment: depression, cocaine use Prior Outpatient Therapy: Prior Outpatient Therapy: No Prior Therapy Dates: NA Prior Therapy Facilty/Provider(s): NA Reason for Treatment: NA Does patient  have an ACCT team?:  No Does patient have Intensive In-House Services?  : No Does patient have [facility] services? : No Does patient have P4CC services?: No  Alcohol Screening: 1. How often do you have a drink containing alcohol?: Never 2. How many drinks containing alcohol do you have on a typical day when you are drinking?: 1 or 2 3. How often do you have six or more drinks on one occasion?: Never Preliminary Score: 0 4. How often during the last year have you found that you were not able to stop drinking once you had started?: Never 5. How often during the last year have you failed to do what was normally expected from you becasue of drinking?: Never 6. How often during the last year have you needed a first drink in the morning to get yourself going after a heavy drinking session?: Never 7. How often during the last year have you had a feeling of guilt of remorse after drinking?: Never 8. How often during the last year have you been unable to remember what happened the night before because you had been drinking?: Never 9. Have you or someone else been injured as a result of your drinking?: No 10. Has a relative or friend or a doctor or another health worker been concerned about your drinking or suggested you cut down?: No Alcohol Use Disorder Identification Test Final Score (AUDIT): 0 Brief Intervention: AUDIT score less than 7 or less-screening does not suggest unhealthy drinking-brief intervention not indicated Substance Abuse History in the last 12 months:  Yes.   Consequences of Substance Abuse: Medical Consequences:  instability Previous Psychotropic Medications: Yes  Psychological Evaluations: Yes  Past Medical History:  Past Medical History  Diagnosis Date  . DVT (deep venous thrombosis) (HCC) 01/08/2011    lt leg  . DVT of lower limb, acute (HCC) 01/12/2011  . BPH (benign prostatic hyperplasia)   . Current use of long term anticoagulation   . 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 Noncompliance w/medication treatment due to intermit use of medication   . History of tobacco use     QUIT early 2013  . Hypertension   . Depression   . Enlarged prostate   . Diabetes mellitus without complication [facility])     Past Surgical History  Procedure Laterality Date  . Tonsillectomy    . Bilateral upper extremity angiogram N/A 07/23/2011    Procedure: BILATERAL UPPER EXTREMITY ANGIOGRAM;  Surgeon: Fransisco Hertz, MD;  Location: [facility] CATH LAB;  Service: Cardiovascular;  Laterality: N/A;   Family History: History reviewed. No pertinent family history. Family Psychiatric  History: MDD, ETOH Tobacco Screening: @FLOW ([phone])::1)@ Social History:  History  Alcohol Use No    Comment: no alcohol since 09/01/13     History  Drug Use No    Comment: last use 09/01/13    Additional Social History: Marital status: Married    Pain Medications: Pt denies  Prescriptions: see PTA med list Over the Counter: Pt denies  History of alcohol / drug use?: Yes Longest period of sobriety (when/how long): unknown Negative Consequences of Use: Personal relationships, Financial Name of Substance 1: crack cocaine 1 - Age of First Use: unknown 1 - Amount (size/oz): $200 1 - Frequency: daily 1 - Duration: ongoing 1 - Last Use / Amount: 08/12/15                  Allergies:  No Known  Allergies Lab Results:  Results for orders placed or performed during the hospital encounter of 08/12/15 (from the past 48 hour(s))  Glucose, capillary     Status: Abnormal   Collection Time: 08/13/15 11:49 AM  Result Value Ref Range   Glucose-Capillary 273 (H) 65 - 99 mg/dL   Comment 1 Notify RN    Comment 2 Document in Chart   Glucose, capillary     Status: Abnormal   Collection Time: 08/13/15  4:49 PM  Result Value Ref Range   Glucose-Capillary 196 (H) 65 - 99 mg/dL   Comment 1 Notify RN    Comment 2 Document in Chart   Glucose, capillary     Status: Abnormal    Collection Time: 08/13/15  7:55 PM  Result Value Ref Range   Glucose-Capillary 184 (H) 65 - 99 mg/dL   Comment 1 Notify RN   Glucose, capillary     Status: Abnormal   Collection Time: 08/14/15  6:00 AM  Result Value Ref Range   Glucose-Capillary 200 (H) 65 - 99 mg/dL  Glucose, capillary     Status: Abnormal   Collection Time: 08/14/15 12:09 PM  Result Value Ref Range   Glucose-Capillary 198 (H) 65 - 99 mg/dL  Glucose, capillary     Status: Abnormal   Collection Time: 08/14/15  4:52 PM  Result Value Ref Range   Glucose-Capillary 213 (H) 65 - 99 mg/dL  Glucose, capillary     Status: Abnormal   Collection Time: 08/14/15  8:05 PM  Result Value Ref Range   Glucose-Capillary 303 (H) 65 - 99 mg/dL  Glucose, capillary     Status: Abnormal   Collection Time: 08/15/15  6:24 AM  Result Value Ref Range   Glucose-Capillary 185 (H) 65 - 99 mg/dL    Blood Alcohol level:  Lab Results  Component Value Date   ETH <5 08/12/2015   ETH <11 09/02/2013    Metabolic Disorder Labs:  No results found for: HGBA1C, MPG No results found for: PROLACTIN No results found for: CHOL, TRIG, HDL, CHOLHDL, VLDL, LDLCALC  Current Medications: Current Facility-Administered Medications  Medication Dose Route Frequency Provider Last Rate Last Dose  . acetaminophen (TYLENOL) tablet 650 mg  650 mg Oral Q6H PRN Thermon LeylandLaura A Davis, NP   650 mg at 08/15/15 0804  . alum & mag hydroxide-simeth (MAALOX/MYLANTA) 200-200-20 MG/5ML suspension 30 mL  30 mL Oral Q4H PRN Thermon LeylandLaura A Davis, NP      . DULoxetine (CYMBALTA) DR capsule 30 mg  30 mg Oral Daily Thermon LeylandLaura A Davis, NP   30 mg at 08/15/15 0804  . fluticasone (FLONASE) 50 MCG/ACT nasal spray 1 spray  1 spray Each Nare Daily PRN Sanjuana KavaAgnes I Nwoko, NP      . glipiZIDE (GLUCOTROL) tablet 5 mg  5 mg Oral BID AC Thermon LeylandLaura A Davis, NP   5 mg at 08/15/15 [phone]  . hydrOXYzine (ATARAX/VISTARIL) tablet 25 mg  25 mg Oral Q6H PRN Thermon LeylandLaura A Davis, NP   25 mg at 08/14/15 0738  . insulin aspart (novoLOG)  injection 0-15 Units  0-15 Units Subcutaneous TID WC Thermon LeylandLaura A Davis, NP   3 Units at 08/15/15 [phone]  . insulin glargine (LANTUS) injection 35 Units  35 Units Subcutaneous QHS Sanjuana KavaAgnes I Nwoko, NP      . levofloxacin (LEVAQUIN) tablet 500 mg  500 mg Oral Daily Sanjuana KavaAgnes I Nwoko, NP      . magnesium hydroxide (MILK OF MAGNESIA) suspension 30 mL  30 mL Oral  Daily PRN Thermon Leyland, NP      . Melene Muller ON 08/16/2015] metFORMIN (GLUCOPHAGE-XR) 24 hr tablet 750 mg  750 mg Oral Q breakfast Sanjuana Kava, NP      . pravastatin (PRAVACHOL) tablet 20 mg  20 mg Oral Daily Sanjuana Kava, NP      . QUEtiapine (SEROQUEL) tablet 100 mg  100 mg Oral QHS Thermon Leyland, NP   100 mg at 08/14/15 2122  . rivaroxaban (XARELTO) tablet 20 mg  20 mg Oral Q supper Thermon Leyland, NP   20 mg at 08/14/15 1620  . tamsulosin (FLOMAX) capsule 0.4 mg  0.4 mg Oral QHS Sanjuana Kava, NP      . traZODone (DESYREL) tablet 50 mg  50 mg Oral QHS PRN Thermon Leyland, NP   50 mg at 08/14/15 2122   PTA Medications: Prescriptions prior to admission  Medication Sig Dispense Refill Last Dose  . acetaminophen (TYLENOL) 650 MG CR tablet Take 1,300-1,950 mg by mouth 2 (two) times daily as needed for pain.    08/09/2015  . benzonatate (TESSALON) 100 MG capsule Take 1 capsule (100 mg total) by mouth 3 (three) times daily as needed for cough. (Patient not taking: Reported on 08/12/2015) 10 capsule 0 Completed Course at Unknown time  . Cyanocobalamin (B-12 PO) Take 1 tablet by mouth daily.   Past Week at Unknown time  . fluticasone (FLONASE) 50 MCG/ACT nasal spray Place 1 spray into both nostrils daily. (Patient taking differently: Place 1 spray into both nostrils daily as needed for allergies. ) 16 g 0 unknown  . glipiZIDE (GLUCOTROL) 5 MG tablet Take 5 mg by mouth 2 (two) times daily before a meal.   08/09/2015  . HYDROcodone-acetaminophen (NORCO) 5-325 MG tablet Take 1 tablet by mouth every 6 (six) hours as needed. 10 tablet 0 hasnt started  . insulin glargine  (LANTUS) 100 UNIT/ML injection Inject 50 Units into the skin daily.    08/09/2015  . levofloxacin (LEVAQUIN) 500 MG tablet Take 1 tablet (500 mg total) by mouth daily. 10 tablet 0 Past Week at Unknown time  . lisinopril (PRINIVIL,ZESTRIL) 20 MG tablet Take 20 mg by mouth daily.   08/09/2015  . metFORMIN (GLUCOPHAGE-XR) 750 MG 24 hr tablet Take 750 mg by mouth daily with breakfast.   08/09/2015  . Multiple Vitamin (MULTIVITAMIN WITH MINERALS) TABS tablet Take 1 tablet by mouth daily.   08/09/2015  . Multiple Vitamins-Minerals (MULTIVITAMIN WITH MINERALS) tablet Take 1 tablet by mouth daily. 30 tablet 0 Past Week at Unknown time  . pravastatin (PRAVACHOL) 20 MG tablet Take 20 mg by mouth daily.    08/09/2015  . rivaroxaban (XARELTO) 20 MG TABS tablet Take 1 tablet (20 mg total) by mouth daily with supper. (Patient taking differently: Take 20 mg by mouth daily. ) 30 tablet 0 Past Week at Unknown time  . tamsulosin (FLOMAX) 0.4 MG CAPS capsule Take 1 capsule (0.4 mg total) by mouth at bedtime. 30 capsule 0 08/09/2015  . traMADol (ULTRAM) 50 MG tablet Take 50 mg by mouth every 6 (six) hours as needed for moderate pain.   08/07/2015    Musculoskeletal: Strength & Muscle Tone: within normal limits Gait & Station: normal Patient leans: N/A  Psychiatric Specialty Exam: Physical Exam  Review of Systems  Psychiatric/Behavioral: Positive for depression, hallucinations and substance abuse. Negative for suicidal ideas. The patient is nervous/anxious and has insomnia.   All other systems reviewed and are negative.  Blood pressure 112/79, pulse 75, temperature 98.2 F (36.8 C), temperature source Oral, resp. rate 12, height  (1.727 m), weight 88.905 kg (196 lb), SpO2 98 %.Body mass index is 29.81 kg/(m^2).  General Appearance: Casual and Fairly Groomed  Eye Contact:  Fair  Speech:  Clear and Coherent and Normal Rate  Volume:  Normal  Mood:  Anxious and Depressed  Affect:  Appropriate, Congruent and Depressed   Thought Process:  Coherent, Goal Directed and Descriptions of Associations: Intact  Orientation:  Full (Time, Place, and Person)  Thought Content:  Symptoms, worries, concerns  Suicidal Thoughts:  No  Homicidal Thoughts:  No  Memory:  Immediate;   Fair Recent;   Fair Remote;   Fair  Judgement:  Fair  Insight:  Fair  Psychomotor Activity:  Normal  Concentration:  Concentration: Good and Attention Span: Good  Recall:  [employer] of Knowledge:  Fair  Language:  Fair  Akathisia:  No  Handed:    AIMS (if indicated):     Assets:  Communication Skills Desire for Improvement Resilience Social Support Talents/Skills  ADL's:  Intact  Cognition:  WNL  Sleep:  Number of Hours: 6.75   Treatment Plan Summary: Major depressive disorder, recurrent severe without psychotic features (HCC), unstable, managed as below:  Medications: -Continue Cymbalta  po daily for MDD -Continue Vistaril  po q6h prn anxiety -Lower Seroquel to  po qhs for insomnia/racing thoughts -Continue Trazodone  po qhs prn insomnia   Observation Level/Precautions:  15 minute checks  Laboratory:  Labs resulted, reviewed, and stable at this time.   Psychotherapy:  Group therapy, individual therapy, psychoeducation  Medications:  See MAR above  Consultations: None    Discharge Concerns: None    Estimated LOS: 5-7 days  Other:  N/A    I certify that inpatient services furnished can reasonably be expected to improve the patient's condition.    Beau Fanny, [place] 6/7/201710:36 AM

## 2015-08-15 NOTE — [facility] Group Notes (Signed)
[facility] LCSW Group Therapy  08/15/2015 12:52 PM  Type of Therapy:  Group Therapy  Participation Level:  Did Not Attend -pt invited. Chose to remain in bed.  Modes of Intervention:  Confrontation, Discussion, Education, Exploration, Problem-solving, Rapport Building, Socialization and Support  Summary of Progress/Problems: Today's Topic: Overcoming Obstacles. Patients identified one short term goal and potential obstacles in reaching this goal. Patients processed barriers involved in overcoming these obstacles. Patients identified steps necessary for overcoming these obstacles and explored motivation (internal and external) for facing these difficulties head on.   Smart, Katiana Ruland LCSW 08/15/2015, 12:52 PM

## 2015-08-15 NOTE — [facility] Counselor (Signed)
Adult Comprehensive Assessment  Patient ID: [name], male   DOB: 09-Feb-1966, 50 y.o.   MRN: [id number]  Information Source: Information source: Patient  Current Stressors:  Employment / Job issues: pt reports that he is employed  Family Relationships: lack of support from some family members Surveyor, quantity / Lack of resources (include bankruptcy): income from [employer].  Housing / Lack of housing: now homeless Substance abuse: cocaine abuse  Living/Environment/Situation:  Living Arrangements: Other (Comment) Living conditions (as described by patient or guardian): living with friends How long has patient lived in current situation?: homeless on and off.  What is atmosphere in current home: Temporary  Family History:  Marital status: in a relationship Separated, when?: few years What types of issues is patient dealing with in the relationship?: both were using drugs, infidelity Additional relationship information: N/A Does patient have children?: Yes How many children?: 6 How is patient's relationship with their children?: Strained relationships with children. "I just had a baby girl. She was born premature. Health problems."  Childhood History:  By whom was/is the patient raised?: Both parents Additional childhood history information: Pt describes his childhood as good Description of patient's relationship with caregiver when they were a child: Pt reports getting along well with parents growing up.  Patient's description of current relationship with people who raised him/her: Mom is deceased, father is supportive to pt Does patient have siblings?: Yes Number of Siblings: 4 Description of patient's current relationship with siblings: brothers - strained relationship with them  Did patient suffer any verbal/emotional/physical/sexual abuse as a child?: No Did patient suffer from severe childhood neglect?: No Has patient ever been sexually  abused/assaulted/raped as an adolescent or adult?: No Was the patient ever a victim of a crime or a disaster?: No Witnessed domestic violence?: No Has patient been effected by domestic violence as an adult?: No  Education:  Highest grade of school patient has completed: some college Currently a Consulting civil engineer?: No Learning disability?: No  Employment/Work Situation:  Employment situation: Unemployed Patient's job has been impacted by current illness: No What is the longest time patient has a held a job?: 7 years 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 Relapsed about one month ago.  If attempted suicide, did drugs/alcohol play a role in this?: No Alcohol/Substance Abuse Treatment Hx: Past Tx, Inpatient;Past detox;Past Tx, Outpatient If yes, describe treatment: [facility] - 3 years ago, [facility] -3 year ago, [facility] for detox in 2015; [place] for inpatient care in the past few years.  Has alcohol/substance abuse ever caused legal problems?: No  Social Support System:  Forensic psychologist System: Poor Describe Community Support System: Pt reports having a lack of support Type of faith/religion: Baptist How does patient's faith help to cope with current illness?: Pray  Leisure/Recreation:  Leisure and Hobbies: Basketball  Strengths/Needs:  What things does the patient do well?: hard worker In what areas does patient struggle / problems for patient: Depression, anxiety, substance abuse, SI  Discharge Plan: -Updated Does patient have access to transportation?:  Yes Will patient be returning to same living situation after discharge?: Yes Currently receiving  community mental health services: No-[facility] but has not been seeing any providers recently-[facility]  If no, would patient like referral for services when discharged?: Pt unsure if he wants i/p or o/p treatment at discharge due to his employment. CSW assessing.  Does patient have financial barriers related to discharge medications?: [facility] connected; limited income.   Summary/Recommendations:          Summary/Recommendations:   Summary and Recommendations (to be completed by the evaluator): Patient is 50 year old male with diagnosis of MDD and Cocaine Use Disorder. He presents to the hospital seeking treatment for suicidal ideations, crack cocaine abuse (up to $200 daily), increased depression/mood instability, and for medication stabilization. [facility]. Recommendations for patient include: crisis stabilization, therapeutic milieu, encourage group attendance and participation, medication management for withdrawals/mood stabilization, and development of comprehensive mental wellness/sobriety plan.   Smart, Esli Clements LCSW 08/15/2015 2:24 PM

## 2015-08-16 LAB — GLUCOSE, CAPILLARY
Glucose-Capillary: 173 mg/dL — ABNORMAL HIGH (ref 65–99)
Glucose-Capillary: 186 mg/dL — ABNORMAL HIGH (ref 65–99)
Glucose-Capillary: 249 mg/dL — ABNORMAL HIGH (ref 65–99)
Glucose-Capillary: 276 mg/dL — ABNORMAL HIGH (ref 65–99)
Glucose-Capillary: 249 mg/dL — ABNORMAL HIGH (ref 65–99)

## 2015-08-16 LAB — LIPID PANEL
Cholesterol: 182 mg/dL (ref 0–200)
HDL: 39 mg/dL — ABNORMAL LOW (ref >40)
LDL Cholesterol: 107 mg/dL — ABNORMAL HIGH (ref 0–99)
Total CHOL/HDL Ratio: 4.7 RATIO
Triglycerides: 180 mg/dL — ABNORMAL HIGH (ref <150)
VLDL: 36 mg/dL (ref 0–40)

## 2015-08-16 LAB — TSH: TSH: 3.493 u[IU]/mL (ref 0.350–4.500)

## 2015-08-16 MED ORDER — PRAVASTATIN SODIUM 20 MG PO TABS
20.0000 mg | ORAL_TABLET | Freq: Every day | ORAL | Status: DC
  Administered 2015-08-16 – 2015-08-18 (×3): 20 mg via ORAL
  Filled 2015-08-16 (×4): qty 1
  Filled 2015-08-16 (×2): qty 3

## 2015-08-16 NOTE — Progress Notes (Addendum)
D: Pt is isolative and withdrawn to room; states, "I've been feeling very dizzy all day; I think it's the Seroquel." Pt endorsed moderate depression and moderate anxiety; states, "I'm still depressed and anxious' I will rate them at a 6 each." Pt denied pain, AVH, SI or HI. Pt remained calm and cooperative. A: Nurse spoke with Pt about cons of isolations. Pt was advised to leave the room as often as possible to reduced depressive thoughts. Pt was also advised to attend group. Medications offered as prescribed.  Support, encouragement, and safe environment provided.  15-minute safety checks continue. R: Pt was med compliant.  Pt attended NA group. Safety checks continue.

## 2015-08-16 NOTE — [facility] Group Notes (Signed)
[facility] LCSW Group Therapy 08/16/2015  1:15 pm   Type of Therapy: Group Therapy Participation Level: Active  Participation Quality: Attentive, Sharing and Supportive  Affect: Appropriate  Cognitive: Alert and Oriented  Insight: Developing/Improving and Engaged  Engagement in Therapy: Developing/Improving and Engaged  Modes of Intervention: Clarification, Confrontation, Discussion, Education, Exploration, Limit-setting, Orientation, Problem-solving, Rapport Building, Dance movement psychotherapisteality Testing, Socialization and Support  Summary of Progress/Problems: The topic for group was balance in life. Today's group focused on defining balance in one's own words, identifying things that can knock one off balance, and exploring healthy ways to maintain balance in life. Group members were asked to provide an example of a time when they felt off balance, describe how they handled that situation,and process healthier ways to regain balance in the future. Group members were asked to share the most important tool for maintaining balance that they learned [facility] and how they plan to apply this method after discharge. Patient identified his relationship with his wife and children as stressors. He shared that he had 2 years of sobriety before relapsing recently and described his motivation to resume his recovery.  Jeremy BruinKristin Sintia Thomas, MSW, LCSW Clinical Social Worker [facility] [phone]

## 2015-08-16 NOTE — [facility] Suicide Risk Assessment (Signed)
[facility] INPATIENT:  Family/Significant Other Suicide Prevention Education  Suicide Prevention Education:  Patient Refusal for Family/Significant Other Suicide Prevention Education: The patient [name] has refused to provide written consent for family/significant other to be provided Family/Significant Other Suicide Prevention Education during admission and/or prior to discharge.  Physician notified. SPE reviewed with patient and brochure provided. Patient encouraged to return to hospital if having suicidal thoughts, patient verbalized his/her understanding and has no further questions at this time.   Jerelene Salaam, [place] CarboKristin L 08/16/2015, 4:28 PM

## 2015-08-16 NOTE — Progress Notes (Signed)
Pt attended karaoke group.  

## 2015-08-16 NOTE — [facility] Group Notes (Signed)
The focus of this group is to educate the patient on the purpose and policies of crisis stabilization and provide a format to answer questions about their admission.  The group details unit policies and expectations of patients while admitted.  Patient did not attend 0900 nurse education orientation this morning.  Patient stayed in bed.  

## 2015-08-16 NOTE — Progress Notes (Signed)
After dinner patient stated he felt that his blood sugar was up.  CBG was 276.  BP 155/89, P78.  Patient was sitting in dayroom drinking water.  Patient went to bathroom.  Patient stated he ate the steak gravy and tasted too much salt.  Respirations even and unlabored.  No signs/symptoms of pain/distress noted on patient's face/body movements.  Patient stated he was feeling better.

## 2015-08-16 NOTE — Progress Notes (Signed)
[facility] MD Progress Note  08/16/2015 1:20 PM [name]  MRN:  [id number] Subjective:  Patient states " I still feel kind of irritable and anxious at times. I was feeling very frustrated this AM with another patient ."  Objective:[name] is a 50 y.o. AA male with hx of hallucinations , cocaine abuse and depression who presented with worsening depressive sx. Patient seen and chart reviewed.Discussed patient with treatment team.  Pt today is seen as withdrawn , reports continued paranoia about people talking about him and laughing at him. He also feels he has ADRs to some of the medications - reports dizziness - improving since yesterday. Per staff - pt is compliant on medications -continues to need a lot of support.   . Principal Problem: Major depressive disorder, recurrent severe without psychotic features (HCC) Diagnosis:   Patient Active Problem List   Diagnosis Date Noted  . Cocaine use disorder, severe, dependence (HCC) [F14.20] 08/15/2015  . Major depressive disorder, recurrent severe without psychotic features (HCC) [F33.2] 09/03/2013  . 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 Pain, wrist, right [M25.531] 07/22/2011  . History of pulmonary embolism [Z86.711] 07/22/2011  . BPH (benign prostatic hyperplasia) [N40.0] 07/22/2011  . Obstructive uropathy [N13.9] 07/22/2011  . HTN (hypertension) [I10] 07/22/2011  . History of DVT of lower extremity [Z86.718] 07/22/2011  . Former cigar smoker [Z87.891] 07/22/2011  . 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 Noncompliance w/medication treatment due to intermit use of medication [Z91.14]   . Pulmonary embolism, bilateral (HCC) [I26.99] 01/12/2011  . DVT of lower limb, acute (HCC) [I82.409] 01/12/2011  . Sinusitis acute [J01.90] 01/12/2011   Total Time spent with patient: 25 minutes  Past Psychiatric History:Please see H&P.   Past Medical History:  Past Medical History  Diagnosis Date  . DVT (deep  venous thrombosis) (HCC) 01/08/2011    lt leg  . DVT of lower limb, acute (HCC) 01/12/2011  . BPH (benign prostatic hyperplasia)   . Current use of long term anticoagulation   . 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 Noncompliance w/medication treatment due to intermit use of medication   . History of tobacco use     QUIT early 2013  . Hypertension   . Depression   . Enlarged prostate   . Diabetes mellitus without complication [facility])     Past Surgical History  Procedure Laterality Date  . Tonsillectomy    . Bilateral upper extremity angiogram N/A 07/23/2011    Procedure: BILATERAL UPPER EXTREMITY ANGIOGRAM;  Surgeon: Fransisco Hertz, MD;  Location: [facility] CATH LAB;  Service: Cardiovascular;  Laterality: N/A;   Family History:  Family History  Problem Relation Age of Onset  . Depression Mother   . Diabetes Mother   . Depression Brother   . Drug abuse Brother    Family Psychiatric  History: Please see H&P.  Social History: Please see H&P.  History  Alcohol Use No    Comment: no alcohol since 09/01/13     History  Drug Use No    Comment: last use 09/01/13    Social History   Social History  . Marital Status: Married    Spouse Name: N/A  . Number of Children: N/A  . Years of Education: N/A   Social History Main Topics  . Smoking status: Former Smoker    Quit date: 07/18/2013  . Smokeless tobacco: Never Used  . Alcohol Use: No  Comment: no alcohol since 09/01/13  . Drug Use: No     Comment: last use 09/01/13  . Sexual Activity: Yes   Other Topics Concern  . None   Social History Narrative   Additional Social History:    Pain Medications: Pt denies  Prescriptions: see PTA med list Over the Counter: Pt denies  History of alcohol / drug use?: Yes Longest period of sobriety (when/how long): unknown Negative Consequences of Use: Personal relationships, Financial Name of Substance 1: crack cocaine 1 - Age of  First Use: unknown 1 - Amount (size/oz): $200 1 - Frequency: daily 1 - Duration: ongoing 1 - Last Use / Amount: 08/12/15                  Sleep: Fair  Appetite:  Fair  Current Medications: Current Facility-Administered Medications  Medication Dose Route Frequency Provider Last Rate Last Dose  . acetaminophen (TYLENOL) tablet 650 mg  650 mg Oral Q6H PRN Thermon Leyland, NP   650 mg at 08/16/15 0824  . alum & mag hydroxide-simeth (MAALOX/MYLANTA) 200-200-20 MG/5ML suspension 30 mL  30 mL Oral Q4H PRN Thermon Leyland, NP      . DULoxetine (CYMBALTA) DR capsule 30 mg  30 mg Oral Daily Thermon Leyland, NP   30 mg at 08/16/15 0816  . fluticasone (FLONASE) 50 MCG/ACT nasal spray 1 spray  1 spray Each Nare Daily PRN Sanjuana Kava, NP      . glipiZIDE (GLUCOTROL) tablet 5 mg  5 mg Oral BID AC Thermon Leyland, NP   5 mg at 08/16/15 0640  . hydrOXYzine (ATARAX/VISTARIL) tablet 25 mg  25 mg Oral Q6H PRN Thermon Leyland, NP   25 mg at 08/16/15 0824  . insulin aspart (novoLOG) injection 0-15 Units  0-15 Units Subcutaneous TID WC Thermon Leyland, NP   3 Units at 08/16/15 1222  . insulin glargine (LANTUS) injection 35 Units  35 Units Subcutaneous QHS Sanjuana Kava, NP   35 Units at 08/15/15 2212  . levofloxacin (LEVAQUIN) tablet 500 mg  500 mg Oral Daily Sanjuana Kava, NP   500 mg at 08/16/15 0816  . magnesium hydroxide (MILK OF MAGNESIA) suspension 30 mL  30 mL Oral Daily PRN Thermon Leyland, NP      . metFORMIN (GLUCOPHAGE-XR) 24 hr tablet 750 mg  750 mg Oral Q breakfast Sanjuana Kava, NP   750 mg at 08/16/15 0816  . pravastatin (PRAVACHOL) tablet 20 mg  20 mg Oral QHS Fernando A Cobos, MD      . QUEtiapine (SEROQUEL) tablet 50 mg  50 mg Oral QHS Beau Fanny, FNP   50 mg at 08/15/15 2207  . rivaroxaban (XARELTO) tablet 20 mg  20 mg Oral Q supper Thermon Leyland, NP   20 mg at 08/15/15 1744  . tamsulosin (FLOMAX) capsule 0.4 mg  0.4 mg Oral QHS Sanjuana Kava, NP   0.4 mg at 08/15/15 2208  . traZODone (DESYREL)  tablet 50 mg  50 mg Oral QHS PRN Thermon Leyland, NP   50 mg at 08/14/15 2122    Lab Results:  Results for orders placed or performed during the hospital encounter of 08/12/15 (from the past 48 hour(s))  Glucose, capillary     Status: Abnormal   Collection Time: 08/14/15  4:52 PM  Result Value Ref Range   Glucose-Capillary 213 (H) 65 - 99 mg/dL  Glucose, capillary     Status:  Abnormal   Collection Time: 08/14/15  8:05 PM  Result Value Ref Range   Glucose-Capillary 303 (H) 65 - 99 mg/dL  Glucose, capillary     Status: Abnormal   Collection Time: 08/15/15  6:24 AM  Result Value Ref Range   Glucose-Capillary 185 (H) 65 - 99 mg/dL  Glucose, capillary     Status: Abnormal   Collection Time: 08/15/15 12:02 PM  Result Value Ref Range   Glucose-Capillary 240 (H) 65 - 99 mg/dL  Glucose, capillary     Status: Abnormal   Collection Time: 08/15/15  4:31 PM  Result Value Ref Range   Glucose-Capillary 200 (H) 65 - 99 mg/dL  Glucose, capillary     Status: Abnormal   Collection Time: 08/15/15  5:39 PM  Result Value Ref Range   Glucose-Capillary 207 (H) 65 - 99 mg/dL  Glucose, capillary     Status: Abnormal   Collection Time: 08/15/15  8:30 PM  Result Value Ref Range   Glucose-Capillary 246 (H) 65 - 99 mg/dL  Glucose, capillary     Status: Abnormal   Collection Time: 08/16/15  6:06 AM  Result Value Ref Range   Glucose-Capillary 173 (H) 65 - 99 mg/dL  TSH     Status: None   Collection Time: 08/16/15  6:34 AM  Result Value Ref Range   TSH 3.493 0.350 - 4.500 uIU/mL    Comment: [facility]  Lipid panel     Status: Abnormal   Collection Time: 08/16/15  6:34 AM  Result Value Ref Range   Cholesterol 182 0 - 200 mg/dL   Triglycerides 914180 (H) <150 mg/dL   HDL 39 (L) >78>40 mg/dL   Total CHOL/HDL Ratio 4.7 RATIO   VLDL 36 0 - 40 mg/dL   LDL Cholesterol 295107 (H) 0 - 99 mg/dL    Comment:        Total Cholesterol/HDL:CHD Risk Coronary Heart Disease Risk Table                      Men   Women  1/2 Average Risk   3.4   3.3  Average Risk       5.0   4.4  2 X Average Risk   9.6   7.1  3 X Average Risk  23.4   11.0        Use the calculated Patient Ratio above and the CHD Risk Table to determine the patient's CHD Risk.        ATP III CLASSIFICATION (LDL):  <100     mg/dL   Optimal  [id number]  mg/dL   Near or Above                    Optimal  130-159  mg/dL   Borderline  [id number]  mg/dL   High  >962>190     mg/dL   Very High [facility]   Glucose, capillary     Status: Abnormal   Collection Time: 08/16/15 12:15 PM  Result Value Ref Range   Glucose-Capillary 186 (H) 65 - 99 mg/dL    Blood Alcohol level:  Lab Results  Component Value Date   ETH <5 08/12/2015   ETH <11 09/02/2013    Physical Findings: AIMS: Facial and Oral Movements Muscles of Facial Expression: None, normal Lips and Perioral Area: None, normal Jaw: None, normal Tongue: None, normal,Extremity Movements Upper (arms, wrists, hands, fingers): None, normal Lower (legs,  knees, ankles, toes): None, normal, Trunk Movements Neck, shoulders, hips: None, normal, Overall Severity Severity of abnormal movements (highest score from questions above): None, normal Incapacitation due to abnormal movements: None, normal Patient's awareness of abnormal movements (rate only patient's report): No Awareness, Dental Status Current problems with teeth and/or dentures?: No Does patient usually wear dentures?: No  CIWA:  CIWA-Ar Total: 0 COWS:     Musculoskeletal: Strength & Muscle Tone: within normal limits Gait & Station: normal Patient leans: N/A  Psychiatric Specialty Exam: Physical Exam  Nursing note and vitals reviewed.   Review of Systems  Psychiatric/Behavioral: Positive for depression and substance abuse. The patient is nervous/anxious.   All other systems reviewed and are negative.   Blood pressure 125/83, pulse 86, temperature 98 F (36.7 C), temperature  source Oral, resp. rate 16, height 5\' 8"  (1.727 m), weight 88.905 kg (196 lb), SpO2 98 %.Body mass index is 29.81 kg/(m^2).  General Appearance: Guarded  Eye Contact:  Fair  Speech:  Slow  Volume:  Decreased  Mood:  Anxious and Depressed  Affect:  Congruent  Thought Process:  Goal Directed and Descriptions of Associations: Intact  Orientation:  Full (Time, Place, and Person)  Thought Content:  Delusions, Paranoid Ideation and Rumination  Suicidal Thoughts:  No  Homicidal Thoughts:  No  Memory:  Immediate;   Fair Recent;   Fair Remote;   Fair  Judgement:  Impaired  Insight:  Shallow  Psychomotor Activity:  Decreased  Concentration:  Concentration: Fair and Attention Span: Fair  Recall:  [employer] of Knowledge:  Fair  Language:  Fair  Akathisia:  No  Handed:  Right  AIMS (if indicated):     Assets:  Desire for Improvement  ADL's:  Intact  Cognition:  WNL  Sleep:  Number of Hours: 6.75     Treatment Plan Summary::[name] is a 50 y.o. AA male with hx of hallucinations , cocaine abuse and depression who presented with worsening depressive sx.Pt with some ongoing dizziness , although progressing with reduction in his bedtime dose of medication. Pt continues to have depression as well as paranoid delusions and continues to need treatment.  Daily contact with patient to assess and evaluate symptoms and progress in treatment and Medication management   Reviewed past medical records,treatment plan.  Will continue Cymbalta 30 mg po daily for affective sx. Will continue seroquel - dose reduced to 50 mg po qhs for sleep/psychosis. Will continue Trazodone 50 mg po qhs prn for sleep. Will continue to monitor vitals ,medication compliance and treatment side effects while patient is here.  Will monitor for medical issues as well as call consult as needed.  Reviewed labs- tsh - wnl , lipid panel- wnl, hba1c- wnl . CSW will continue working on disposition. Referral to substance abuse  treatment program as needed. Patient to participate in therapeutic milieu .       Jeremy Cotta, MD 08/16/2015, 1:20 PM

## 2015-08-16 NOTE — Progress Notes (Signed)
D:  Patient's self inventory sheet, patient sleeps good, sleep medication is helpful.  Good appetite, low energy level, good concentration.  Rated depression, hopeless and anxiety #5.  Denied withdrawals.  Denied SI.  Physical problems, headaches, lightheaded.  Worst pain in past 24 hours is #6, back, left knee.  Pain medication is helpful.  Goal is to be more attentive, focus.  Plans to be more active.  Does have discharge Plans. A:  Medications administered per MD orders. R:  Denied SI and HI, contracts for safety.  Denied A/V hallucinations.

## 2015-08-17 LAB — GLUCOSE, CAPILLARY
GLUCOSE-CAPILLARY: 144 mg/dL — AB (ref 65–99)
GLUCOSE-CAPILLARY: 281 mg/dL — AB (ref 65–99)
Glucose-Capillary: 201 mg/dL — ABNORMAL HIGH (ref 65–99)
Glucose-Capillary: 177 mg/dL — ABNORMAL HIGH (ref 65–99)

## 2015-08-17 LAB — HEMOGLOBIN A1C
HEMOGLOBIN A1C: 8.8 % — AB (ref 4.8–5.6)
Mean Plasma Glucose: 206 mg/dL

## 2015-08-17 NOTE — Tx Team (Addendum)
Interdisciplinary Treatment Plan Update (Adult)  Date:  08/17/2015  Time Reviewed:  9:30am  Progress in Treatment: Attending groups: Yes  Participating in groups:  Yes Taking medication as prescribed:  Yes. Tolerating medication:  Yes. Family/Significant othe contact made: No patient has declined collateral contact Patient understands diagnosis:  Yes. and As evidenced by:  seeking treatment for Discussing patient identified problems/goals with staff:  Yes. Medical problems stabilized or resolved:  Yes. Denies suicidal/homicidal ideation: Yes. Issues/concerns per patient self-inventory:  Other:  Discharge Plan or Barriers: Home with outpatient services at [facility]  Reason for Continuation of Hospitalization: Depression Medication stabilization Withdrawal symptoms  Comments:  [name] is an 50 y.o. male. Pt arrived to [facility] reporting crack cocaine addiction. Pt states he is in need of inpatient treatment. Pt reports feeling depressed due to his baby recently being born prematurely. Pt denies SI/HI. Pt denies AVH. Pt reports previous hospitalizations at the [facility] and [facility] for SA and depression. Pt denies current outpatient treatment. Pt denies current mental health medication. Pt states he uses $200 of crack cocaine a day.  Pt left [facility] and returned. When the Pt returned he stated that he would run his car off a bridge. Diagnosis:  Opiod use disorder, severe; F33.1 MDD, moderate  Estimated length of stay:  Discharge anticipated for Sunday 6/11   New goal(s): to develop effective aftercare plan.   Additional Comments:  Patient and CSW reviewed pt's identified goals and treatment plan. Patient verbalized understanding and agreed to treatment plan. CSW reviewed [facility] "Discharge Process and Patient Involvement" Form. Pt verbalized understanding of information provided and signed form.    Review of initial/current patient goals per problem list:  1. Goal(s): Patient will participate  in aftercare plan  Met: Yes  Target date: at discharge  As evidenced by: Patient will participate within aftercare plan AEB aftercare provider and housing plan at discharge being identified.  6/7: Pt last admitted to [facility] in 2015 and had been following up with [facility]. No current providers.  6/9: Goal met. Patient plans to return home to follow up with outpatient services.   2. Goal (s): Patient will exhibit decreased depressive symptoms and suicidal ideations.  Met: Yes   Target date: at discharge  As evidenced by: Patient will utilize self rating of depression at 3 or below and demonstrate decreased signs of depression or be deemed stable for discharge by MD.  6/7: Pt rates depression as high. Denies SI/HI/AVH today.  6/9: Goal met. Patient rates depression at 2, denies SI.   3. Goal(s): Patient will demonstrate decreased signs of withdrawal due to substance abuse  KGS:[id number] for discharge per MD  Target date:at discharge   As evidenced by: Patient will produce a CIWA/COWS score of 0, have stable vitals signs, and no symptoms of withdrawal.  6/7: Pt reports mild withdrawals with no COWS score and stable vitals.  6/9: Adequate for discharge. Patient with CIWA/COWS of 1 experiencing anxiety.   Attendees: Patient:    Family:    Physician: Dr. Parke Poisson; Dr. Shea Evans 08/17/2015 9:30 AM  Nursing: Leanne Lovely, Grayland Ormond, Marcella Dubs, RN 08/17/2015 9:30 AM  Clinical Social Worker: Erasmo Downer Manessa Buley, LCSW 08/17/2015 9:30 AM  Other: Peri Maris, LCSWA 08/17/2015 9:30 AM  Other:  08/17/2015 9:30 AM  Other:  08/17/2015 9:30 AM  Other: Leslee Home NP 08/17/2015 9:30 AM  Other:    Other:         Tilden Fossa, LCSW Clinical Social Worker [employer]  [facility] [phone]

## 2015-08-17 NOTE — Progress Notes (Signed)
Patient attended AA group and participated. 

## 2015-08-17 NOTE — Progress Notes (Signed)
D: Pt is isolative and withdrawn to self even while in the dayroom. Pt endorsed moderate depression and moderate anxiety with some worrying; states, "My blood sugar was high today, it has not always been this high; its giving me a lot of concerns." Pt denied pain, AVH, SI or HI. Pt remained calm and cooperative. A: Medications offered as prescribed.  Support, encouragement, and safe environment provided.  15-minute safety checks continue. R: Pt was med compliant.  Pt attended karaoke group. Safety checks continue.

## 2015-08-17 NOTE — Progress Notes (Signed)
D:  Patient's self inventory sheet, patient has fair sleep, no sleep medication given.  Good appetite, normal energy level, good concentration.  Rated depression and hopeless 3, denied anxiety.  Denied withdrawals.  Denied SI.  Physical problems, back, left knee pain.  Pain medication is helpful.  Goal is to strengthen his spirit and mind.  Plans to read bible, talk to support, wife.  Denied discharge plan. A:  Medications administered per MD orders.  Emotional support and encouragement. R: Denied SI and HI, contracts for safety.  Denied A/V hallucinations.  Safety maintained with 15 minute checks.

## 2015-08-17 NOTE — [facility] Group Notes (Signed)
[facility] LCSW Group Therapy 08/17/2015 1:15 PM Type of Therapy: Group Therapy Participation Level: Active  Participation Quality: Attentive, Sharing and Supportive  Affect: Appropriate  Cognitive: Alert and Oriented  Insight: Developing/Improving and Engaged  Engagement in Therapy: Developing/Improving and Engaged  Modes of Intervention: Clarification, Confrontation, Discussion, Education, Exploration, Limit-setting, Orientation, Problem-solving, Rapport Building, Dance movement psychotherapisteality Testing, Socialization and Support  Summary of Progress/Problems: The topic for today was feelings about relapse. Pt discussed what relapse prevention is to them and identified triggers that they are on the path to relapse. Pt processed their feeling towards relapse and was able to relate to peers. Pt discussed coping skills that can be used for relapse prevention. Patient shared that in the past, he has lost his family, job, and housing due to drug use and that he tends to ruminate about the past. He shared that his depression had contributed to his relapse and the dangers of  "people, places, and things". CSW and other group members provided patient with emotional support and encouragement.    Samuella BruinKristin Jewett Mcgann, MSW, LCSW Clinical Social Worker [facility] [phone]

## 2015-08-17 NOTE — Progress Notes (Signed)
Recreation Therapy Notes  Date: 06.09.2017 Time: 9:30am Location: [address] Dayroom   Group Topic: Stress Management  Goal Area(s) Addresses:  Patient will actively participate in stress management techniques presented during session.   Behavioral Response: Did not attend.   Goble Fudala L Vasiliy Mccarry, LRT/CTRS        Rayshad Riviello L 08/17/2015 2:09 PM 

## 2015-08-17 NOTE — Progress Notes (Signed)
[facility] MD Progress Note  08/17/2015 12:14 PM [name]  MRN:  [id number] Subjective:  Patient states " Im doing better. Plan on going home Sunday. I cant believe I am doing so well. I miss my wife and kids, cant wait to be back home with them. "  Objective: [name] is a 50 y.o. AA male with hx of hallucinations , cocaine abuse and depression who presented with worsening depressive sx. Patient seen and chart reviewed.Discussed patient with treatment team.  Pt today is seen as euthymic with improvement in mood and affect brightens upon approach, He declines SI/HI/, hallucinations and delusions, paranoia at this time.  He reports tolerating his medications well at this time. He rates his depression 3/10 and Anxiety 1/10, with 0 being the least and 10 being the worse.  Per staff - pt is compliant on medications -continues to need a lot of support.  . Principal Problem: Major depressive disorder, recurrent severe without psychotic features (HCC) Diagnosis:   Patient Active Problem List   Diagnosis Date Noted  . Cocaine use disorder, severe, dependence (HCC) [F14.20] 08/15/2015  . Major depressive disorder, recurrent severe without psychotic features (HCC) [F33.2] 09/03/2013  . 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 Pain, wrist, right [M25.531] 07/22/2011  . History of pulmonary embolism [Z86.711] 07/22/2011  . BPH (benign prostatic hyperplasia) [N40.0] 07/22/2011  . Obstructive uropathy [N13.9] 07/22/2011  . HTN (hypertension) [I10] 07/22/2011  . History of DVT of lower extremity [Z86.718] 07/22/2011  . Former cigar smoker [Z87.891] 07/22/2011  . 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 Noncompliance w/medication treatment due to intermit use of medication [Z91.14]   . Pulmonary embolism, bilateral (HCC) [I26.99] 01/12/2011  . DVT of lower limb, acute (HCC) [I82.409] 01/12/2011  . Sinusitis acute [J01.90] 01/12/2011   Total Time spent with patient: 25  minutes  Past Psychiatric History:Please see H&P.   Past Medical History:  Past Medical History  Diagnosis Date  . DVT (deep venous thrombosis) (HCC) 01/08/2011    lt leg  . DVT of lower limb, acute (HCC) 01/12/2011  . BPH (benign prostatic hyperplasia)   . Current use of long term anticoagulation   . 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 Noncompliance w/medication treatment due to intermit use of medication   . History of tobacco use     QUIT early 2013  . Hypertension   . Depression   . Enlarged prostate   . Diabetes mellitus without complication [facility])     Past Surgical History  Procedure Laterality Date  . Tonsillectomy    . Bilateral upper extremity angiogram N/A 07/23/2011    Procedure: BILATERAL UPPER EXTREMITY ANGIOGRAM;  Surgeon: Fransisco Hertz, MD;  Location: [facility] CATH LAB;  Service: Cardiovascular;  Laterality: N/A;   Family History:  Family History  Problem Relation Age of Onset  . Depression Mother   . Diabetes Mother   . Depression Brother   . Drug abuse Brother    Family Psychiatric  History: Please see H&P.  Social History: Please see H&P.  History  Alcohol Use No    Comment: no alcohol since 09/01/13     History  Drug Use No    Comment: last use 09/01/13    Social History   Social History  . Marital Status: Married    Spouse Name: N/A  . Number of Children: N/A  . Years of Education: N/A   Social History Main  Topics  . Smoking status: Former Smoker    Quit date: 07/18/2013  . Smokeless tobacco: Never Used  . Alcohol Use: No     Comment: no alcohol since 09/01/13  . Drug Use: No     Comment: last use 09/01/13  . Sexual Activity: Yes   Other Topics Concern  . None   Social History Narrative   Additional Social History:    Pain Medications: Pt denies  Prescriptions: see PTA med list Over the Counter: Pt denies  History of alcohol / drug use?: Yes Longest period of sobriety (when/how  long): unknown Negative Consequences of Use: Personal relationships, Financial Name of Substance 1: crack cocaine 1 - Age of First Use: unknown 1 - Amount (size/oz): $200 1 - Frequency: daily 1 - Duration: ongoing 1 - Last Use / Amount: 08/12/15    Sleep: Fair  Appetite:  Fair  Current Medications: Current Facility-Administered Medications  Medication Dose Route Frequency Provider Last Rate Last Dose  . acetaminophen (TYLENOL) tablet 650 mg  650 mg Oral Q6H PRN Thermon Leyland, NP   650 mg at 08/16/15 2227  . alum & mag hydroxide-simeth (MAALOX/MYLANTA) 200-200-20 MG/5ML suspension 30 mL  30 mL Oral Q4H PRN Thermon Leyland, NP      . DULoxetine (CYMBALTA) DR capsule 30 mg  30 mg Oral Daily Thermon Leyland, NP   30 mg at 08/17/15 0853  . fluticasone (FLONASE) 50 MCG/ACT nasal spray 1 spray  1 spray Each Nare Daily PRN Sanjuana Kava, NP      . glipiZIDE (GLUCOTROL) tablet 5 mg  5 mg Oral BID AC Thermon Leyland, NP   5 mg at 08/17/15 7829  . hydrOXYzine (ATARAX/VISTARIL) tablet 25 mg  25 mg Oral Q6H PRN Thermon Leyland, NP   25 mg at 08/16/15 0824  . insulin aspart (novoLOG) injection 0-15 Units  0-15 Units Subcutaneous TID WC Thermon Leyland, NP   5 Units at 08/17/15 1208  . insulin glargine (LANTUS) injection 35 Units  35 Units Subcutaneous QHS Sanjuana Kava, NP   35 Units at 08/16/15 2229  . levofloxacin (LEVAQUIN) tablet 500 mg  500 mg Oral Daily Sanjuana Kava, NP   500 mg at 08/17/15 0853  . magnesium hydroxide (MILK OF MAGNESIA) suspension 30 mL  30 mL Oral Daily PRN Thermon Leyland, NP      . metFORMIN (GLUCOPHAGE-XR) 24 hr tablet 750 mg  750 mg Oral Q breakfast Sanjuana Kava, NP   750 mg at 08/17/15 0853  . pravastatin (PRAVACHOL) tablet 20 mg  20 mg Oral QHS Craige Cotta, MD   20 mg at 08/16/15 2228  . QUEtiapine (SEROQUEL) tablet 50 mg  50 mg Oral QHS Beau Fanny, FNP   50 mg at 08/16/15 2229  . rivaroxaban (XARELTO) tablet 20 mg  20 mg Oral Q supper Thermon Leyland, NP   20 mg at 08/16/15  1707  . tamsulosin (FLOMAX) capsule 0.4 mg  0.4 mg Oral QHS Sanjuana Kava, NP   0.4 mg at 08/16/15 2228  . traZODone (DESYREL) tablet 50 mg  50 mg Oral QHS PRN Thermon Leyland, NP   50 mg at 08/14/15 2122    Lab Results:  Results for orders placed or performed during the hospital encounter of 08/12/15 (from the past 48 hour(s))  Glucose, capillary     Status: Abnormal   Collection Time: 08/15/15  4:31 PM  Result Value Ref Range  Glucose-Capillary 200 (H) 65 - 99 mg/dL  Glucose, capillary     Status: Abnormal   Collection Time: 08/15/15  5:39 PM  Result Value Ref Range   Glucose-Capillary 207 (H) 65 - 99 mg/dL  Glucose, capillary     Status: Abnormal   Collection Time: 08/15/15  8:30 PM  Result Value Ref Range   Glucose-Capillary 246 (H) 65 - 99 mg/dL  Glucose, capillary     Status: Abnormal   Collection Time: 08/16/15  6:06 AM  Result Value Ref Range   Glucose-Capillary 173 (H) 65 - 99 mg/dL  TSH     Status: None   Collection Time: 08/16/15  6:34 AM  Result Value Ref Range   TSH 3.493 0.350 - 4.500 uIU/mL    Comment: Performed at [facility]  Lipid panel     Status: Abnormal   Collection Time: 08/16/15  6:34 AM  Result Value Ref Range   Cholesterol 182 0 - 200 mg/dL   Triglycerides 161 (H) <150 mg/dL   HDL 39 (L) >09 mg/dL   Total CHOL/HDL Ratio 4.7 RATIO   VLDL 36 0 - 40 mg/dL   LDL Cholesterol 604 (H) 0 - 99 mg/dL    Comment:        Total Cholesterol/HDL:CHD Risk Coronary Heart Disease Risk Table                     Men   Women  1/2 Average Risk   3.4   3.3  Average Risk       5.0   4.4  2 X Average Risk   9.6   7.1  3 X Average Risk  23.4   11.0        Use the calculated Patient Ratio above and the CHD Risk Table to determine the patient's CHD Risk.        ATP III CLASSIFICATION (LDL):  <100     mg/dL   Optimal  540-981  mg/dL   Near or Above                    Optimal  130-159  mg/dL   Borderline  191-478  mg/dL   High  >295     mg/dL    Very High Performed at [facility]   Hemoglobin A1c     Status: Abnormal   Collection Time: 08/16/15  6:34 AM  Result Value Ref Range   Hgb A1c MFr Bld 8.8 (H) 4.8 - 5.6 %    Comment: (NOTE)         Pre-diabetes: 5.7 - 6.4         Diabetes: >6.4         Glycemic control for adults with diabetes: <7.0    Mean Plasma Glucose 206 mg/dL    Comment: (NOTE) Performed At: [facility] [address] [place], [place] [id number] Mila Homer MD QI:[id number] Performed at [facility]   Glucose, capillary     Status: Abnormal   Collection Time: 08/16/15 12:15 PM  Result Value Ref Range   Glucose-Capillary 186 (H) 65 - 99 mg/dL  Glucose, capillary     Status: Abnormal   Collection Time: 08/16/15  4:56 PM  Result Value Ref Range   Glucose-Capillary 249 (H) 65 - 99 mg/dL  Glucose, capillary     Status: Abnormal   Collection Time: 08/16/15  6:11 PM  Result Value Ref Range   Glucose-Capillary 276 (  H) 65 - 99 mg/dL  Glucose, capillary     Status: Abnormal   Collection Time: 08/16/15  9:49 PM  Result Value Ref Range   Glucose-Capillary 249 (H) 65 - 99 mg/dL  Glucose, capillary     Status: Abnormal   Collection Time: 08/17/15  6:05 AM  Result Value Ref Range   Glucose-Capillary 144 (H) 65 - 99 mg/dL  Glucose, capillary     Status: Abnormal   Collection Time: 08/17/15 11:57 AM  Result Value Ref Range   Glucose-Capillary 201 (H) 65 - 99 mg/dL    Blood Alcohol level:  Lab Results  Component Value Date   ETH <5 08/12/2015   ETH <11 09/02/2013    Physical Findings: AIMS: Facial and Oral Movements Muscles of Facial Expression: None, normal Lips and Perioral Area: None, normal Jaw: None, normal Tongue: None, normal,Extremity Movements Upper (arms, wrists, hands, fingers): None, normal Lower (legs, knees, ankles, toes): None, normal, Trunk Movements Neck, shoulders, hips: None, normal, Overall Severity Severity of abnormal movements (highest  score from questions above): None, normal Incapacitation due to abnormal movements: None, normal Patient's awareness of abnormal movements (rate only patient's report): No Awareness, Dental Status Current problems with teeth and/or dentures?: No Does patient usually wear dentures?: No  CIWA:  CIWA-Ar Total: 1 COWS:  COWS Total Score: 2  Musculoskeletal: Strength & Muscle Tone: within normal limits Gait & Station: normal Patient leans: N/A  Psychiatric Specialty Exam: Physical Exam  Nursing note and vitals reviewed.   Review of Systems  Psychiatric/Behavioral: Positive for depression and substance abuse. The patient is nervous/anxious.   All other systems reviewed and are negative.   Blood pressure 128/88, pulse 79, temperature 98 F (36.7 C), temperature source Oral, resp. rate 12, height 5\' 8"  (1.727 m), weight 88.905 kg (196 lb), SpO2 98 %.Body mass index is 29.81 kg/(m^2).  General Appearance: Fairly Groomed  Eye Contact:  Fair  Speech:  Clear and Coherent and Normal Rate  Volume:  Normal  Mood:  Euthymic smiling  Affect:  Congruent  Thought Process:  Goal Directed  Orientation:  Full (Time, Place, and Person)  Thought Content:  WDL and Rumination  Suicidal Thoughts:  No  Homicidal Thoughts:  No  Memory:  Immediate;   Fair Recent;   Fair Remote;   Fair  Judgement:  Intact  Insight:  Shallow  Psychomotor Activity:  Decreased  Concentration:  Concentration: Fair and Attention Span: Fair  Recall:  [employer]Fair  Fund of Knowledge:  Fair  Language:  Fair  Akathisia:  No  Handed:  Right  AIMS (if indicated):     Assets:  Desire for Improvement  ADL's:  Intact  Cognition:  WNL  Sleep:  Number of Hours: 6.25     Treatment Plan Summary::Lindon L Cherylin MylarLeeper is a 50 y.o. AA male with hx of hallucinations , cocaine abuse and depression who presented with worsening depressive sx.Pt with some ongoing dizziness , although progressing with reduction in his bedtime dose of medication. Pt  continues to have depression as well as paranoid delusions and continues to need treatment.  Daily contact with patient to assess and evaluate symptoms and progress in treatment and Medication management   Reviewed past medical records,treatment plan.  Will continue Cymbalta 30 mg po daily for affective sx. Will continue seroquel - dose reduced to 50 mg po qhs for sleep/psychosis. Will continue Trazodone 50 mg po qhs prn for sleep. Will continue to monitor vitals ,medication compliance and treatment side effects while  patient is here.  Will monitor for medical issues as well as call consult as needed.  Reviewed labs- tsh - wnl , lipid panel- wnl, hba1c- wnl . CSW will continue working on disposition. Referral to substance abuse treatment program as needed. Patient to participate in therapeutic milieu .    Truman Hayward, FNP 08/17/2015, 12:14 PM

## 2015-08-17 NOTE — Plan of Care (Signed)
Problem: Coping: Goal: Ability to cope will improve Outcome: Progressing Nurse discussed depression/coping skills with patient.    

## 2015-08-17 NOTE — Progress Notes (Signed)
  [facility] :  Will you be returning to the same living situation after discharge:  Yes,  patient plans to return home with family At discharge, do you have transportation home?: Yes,  family Do you have the ability to pay for your medications: Yes,  patient will be provided with prescriptions at discharge  Release of information consent forms completed and in the chart;  Patient's signature needed at discharge.  Patient to Follow up at: Follow-up Information    Follow up with [facility].   Why:  Messages left for your primary care and mental health providers. Providers should contact you directly to schedule follow up appts. Please contact office if you have not heard from staff by Wednesday 6/14.   Contact information:   [address] [place], [place] [address] [phone]      Next level of care provider [facility] Link:no  Safety Planning and Suicide Prevention discussed: Yes,  with patient   Have you used any form of tobacco in the last 30 days? (Cigarettes, Smokeless Tobacco, Cigars, and/or Pipes): No  Has patient been referred to the Quitline?: N/A patient is not a smoker  Patient has been referred for addiction treatment: Yes  [name], West CarboKristin L 08/17/2015, 4:35 PM

## 2015-08-18 LAB — GLUCOSE, CAPILLARY
GLUCOSE-CAPILLARY: 187 mg/dL — AB (ref 65–99)
GLUCOSE-CAPILLARY: 299 mg/dL — AB (ref 65–99)
Glucose-Capillary: 230 mg/dL — ABNORMAL HIGH (ref 65–99)
Glucose-Capillary: 227 mg/dL — ABNORMAL HIGH (ref 65–99)

## 2015-08-18 NOTE — Plan of Care (Signed)
Problem: Spiritual Needs Goal: Ability to function at adequate level Outcome: Progressing Patient states his depression has improved.  He feels he is ready for discharge.

## 2015-08-18 NOTE — Progress Notes (Signed)
D: Patient denies any depressive symptoms, rating his depression and hopelessness as a 0.  He rates his anxiety as a 2.  His goal today is to "focus and think positive."  He is pleasant and has bright affect.  Patient plans on discharge tomorrow and is looking forward to seeing his wife and children.  He is compliant with medications.  He denies SI/HI/AVH.   A: Continue to monitor medication management and MD orders.  Safety checks completed every 15 minutes per protocol.  Offer support and encouragement as needed. R: Patient is receptive to staff; his behavior is appropriate.

## 2015-08-18 NOTE — [facility] Group Notes (Signed)
[facility] Group Notes:  (Nursing/MHT/Case Management/Adjunct)  Date:  08/18/2015  Time:  0900 am  Type of Therapy:  Psychoeducational Skills  Participation Level:  Did Not Attend  Patient invited; declined to attend.  Cranford MonBeaudry, Mystie Ormand Evans 08/18/2015, 10:15 AM

## 2015-08-18 NOTE — Progress Notes (Signed)
[facility] Group Notes:  (Nursing/MHT/Case Management/Adjunct)  Date:  08/18/2015  Time:  2100  Type of Therapy:  wrap up group  Participation Level:  Active  Participation Quality:  Appropriate, Attentive, Sharing and Supportive  Affect:  Appropriate  Cognitive:  Appropriate  Insight:  Improving  Engagement in Group:  Engaged  Modes of Intervention:  Clarification, Education and Support  Summary of Progress/Problems:  Pt shared that he was disappointed at his blood sugar level of 299 today but reported it was because of a small brownie he ate at lunch. Pt smiled when he spoke of this brownie. Pt shared that he as been in hell, depressed, getting high, and ignoring his family responsibilities. Pt reports having 9 kids aged 1 month to 51 years old. Pt shared that he needs to learn how to leave the past behind and is looking forward to getting back to life with his wife and kids ( 51 year old, and 651 month year old.    Marcille BuffyMcNeil, Kees Idrovo S 08/18/2015, 9:41 PM

## 2015-08-18 NOTE — [facility] Group Notes (Signed)
[facility] Group Notes:  (Clinical Social Work)   01/06/2015     10:00-11:00AM  Summary of Progress/Problems:   In today's process group a decisional balance exercise was used to explore in depth the perceived benefits and costs of alcohol and drugs, as well as the  benefits and costs of replacing these with healthy coping skills.  Patients listed healthy and unhealthy coping techniques, determining with CSW guidance that unhealthy coping techniques work initially, but eventually become harmful.  Motivational Interviewing and the whiteboard were utilized for the exercises.  The patient expressed that the unhealthy coping he often uses is cocaine consumption, and states he would have had 2 years sober if he had not relapsed a month ago when he and his wife had a baby who had to receive treatment in the NICU.  He contributed regularly throughout group and was insightful and positive.  Type of Therapy:  Group Therapy - Process   Participation Level:  Active  Participation Quality:  Attentive, Sharing and Supportive  Affect:  Appropriate  Cognitive:  Appropriate  Insight:  Engaged  Engagement in Therapy:  Engaged  Modes of Intervention:  Education, Motivational Interviewing  Ambrose MantleMareida Grossman-Orr, LCSW 08/18/2015, 11:10 AM

## 2015-08-18 NOTE — Progress Notes (Signed)
[facility] MD Progress Note  08/18/2015 5:24 PM [name]  MRN:  [id number]   Subjective:  Patient reports " I am okay, just ready to leave"  Objective:Sandro L Tener is awake, alert and oriented X4. Seen resting in bedroom Denies suicidal or homicidal ideation at this time. Patient reports " I am more depressed than anything." Denies auditory or visual hallucination and does not appear to be responding to internal stimuli. Patient reports  Interacting  well with staff and others. Patient reports she is medication compliant without mediation side effects. . States his depression 5/10. Reports a good appetite and states he is  resting well throughout the night. Patient report he is excited regarding discharge. Support, encouragement and reassurance was provided.   Principal Problem: Major depressive disorder, recurrent severe without psychotic features (HCC) Diagnosis:   Patient Active Problem List   Diagnosis Date Noted  . Cocaine use disorder, severe, dependence (HCC) [F14.20] 08/15/2015  . Major depressive disorder, recurrent severe without psychotic features (HCC) [F33.2] 09/03/2013  . 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 Pain, wrist, right [M25.531] 07/22/2011  . History of pulmonary embolism [Z86.711] 07/22/2011  . BPH (benign prostatic hyperplasia) [N40.0] 07/22/2011  . Obstructive uropathy [N13.9] 07/22/2011  . HTN (hypertension) [I10] 07/22/2011  . History of DVT of lower extremity [Z86.718] 07/22/2011  . Former cigar smoker [Z87.891] 07/22/2011  . 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 Noncompliance w/medication treatment due to intermit use of medication [Z91.14]   . Pulmonary embolism, bilateral (HCC) [I26.99] 01/12/2011  . DVT of lower limb, acute (HCC) [I82.409] 01/12/2011  . Sinusitis acute [J01.90] 01/12/2011   Total Time spent with patient: 30 minutes  Past Psychiatric History: See Above   Past Medical History:  Past Medical History   Diagnosis Date  . DVT (deep venous thrombosis) (HCC) 01/08/2011    lt leg  . DVT of lower limb, acute (HCC) 01/12/2011  . BPH (benign prostatic hyperplasia)   . Current use of long term anticoagulation   . 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 Noncompliance w/medication treatment due to intermit use of medication   . History of tobacco use     QUIT early 2013  . Hypertension   . Depression   . Enlarged prostate   . Diabetes mellitus without complication [facility])     Past Surgical History  Procedure Laterality Date  . Tonsillectomy    . Bilateral upper extremity angiogram N/A 07/23/2011    Procedure: BILATERAL UPPER EXTREMITY ANGIOGRAM;  Surgeon: Fransisco Hertz, MD;  Location: [facility] CATH LAB;  Service: Cardiovascular;  Laterality: N/A;   Family History:  Family History  Problem Relation Age of Onset  . Depression Mother   . Diabetes Mother   . Depression Brother   . Drug abuse Brother    Family Psychiatric  History: See H&P Social History:  History  Alcohol Use No    Comment: no alcohol since 09/01/13     History  Drug Use No    Comment: last use 09/01/13    Social History   Social History  . Marital Status: Married    Spouse Name: N/A  . Number of Children: N/A  . Years of Education: N/A   Social History Main Topics  . Smoking status: Former Smoker    Quit date: 07/18/2013  . Smokeless tobacco: Never Used  . Alcohol Use: No     Comment: no alcohol since 09/01/13  .  Drug Use: No     Comment: last use 09/01/13  . Sexual Activity: Yes   Other Topics Concern  . None   Social History Narrative   Additional Social History:    Pain Medications: Pt denies  Prescriptions: see PTA med list Over the Counter: Pt denies  History of alcohol / drug use?: Yes Longest period of sobriety (when/how long): unknown Negative Consequences of Use: Personal relationships, Financial Name of Substance 1: crack cocaine 1 - Age of  First Use: unknown 1 - Amount (size/oz): $200 1 - Frequency: daily 1 - Duration: ongoing 1 - Last Use / Amount: 08/12/15                  Sleep: Fair  Appetite:  Fair  Current Medications: Current Facility-Administered Medications  Medication Dose Route Frequency Provider Last Rate Last Dose  . acetaminophen (TYLENOL) tablet 650 mg  650 mg Oral Q6H PRN Thermon Leyland, NP   650 mg at 08/16/15 2227  . alum & mag hydroxide-simeth (MAALOX/MYLANTA) 200-200-20 MG/5ML suspension 30 mL  30 mL Oral Q4H PRN Thermon Leyland, NP      . DULoxetine (CYMBALTA) DR capsule 30 mg  30 mg Oral Daily Thermon Leyland, NP   30 mg at 08/18/15 0819  . fluticasone (FLONASE) 50 MCG/ACT nasal spray 1 spray  1 spray Each Nare Daily PRN Sanjuana Kava, NP      . glipiZIDE (GLUCOTROL) tablet 5 mg  5 mg Oral BID AC Thermon Leyland, NP   5 mg at 08/18/15 1701  . hydrOXYzine (ATARAX/VISTARIL) tablet 25 mg  25 mg Oral Q6H PRN Thermon Leyland, NP   25 mg at 08/16/15 0824  . insulin aspart (novoLOG) injection 0-15 Units  0-15 Units Subcutaneous TID WC Thermon Leyland, NP   8 Units at 08/18/15 1705  . insulin glargine (LANTUS) injection 35 Units  35 Units Subcutaneous QHS Sanjuana Kava, NP   35 Units at 08/17/15 2138  . levofloxacin (LEVAQUIN) tablet 500 mg  500 mg Oral Daily Sanjuana Kava, NP   500 mg at 08/18/15 0819  . magnesium hydroxide (MILK OF MAGNESIA) suspension 30 mL  30 mL Oral Daily PRN Thermon Leyland, NP      . metFORMIN (GLUCOPHAGE-XR) 24 hr tablet 750 mg  750 mg Oral Q breakfast Sanjuana Kava, NP   750 mg at 08/18/15 0819  . pravastatin (PRAVACHOL) tablet 20 mg  20 mg Oral QHS Craige Cotta, MD   20 mg at 08/17/15 2137  . QUEtiapine (SEROQUEL) tablet 50 mg  50 mg Oral QHS Beau Fanny, FNP   50 mg at 08/17/15 2137  . rivaroxaban (XARELTO) tablet 20 mg  20 mg Oral Q supper Thermon Leyland, NP   20 mg at 08/18/15 1700  . tamsulosin (FLOMAX) capsule 0.4 mg  0.4 mg Oral QHS Sanjuana Kava, NP   0.4 mg at 08/17/15 2137   . traZODone (DESYREL) tablet 50 mg  50 mg Oral QHS PRN Thermon Leyland, NP   50 mg at 08/14/15 2122    Lab Results:  Results for orders placed or performed during the hospital encounter of 08/12/15 (from the past 48 hour(s))  Glucose, capillary     Status: Abnormal   Collection Time: 08/16/15  6:11 PM  Result Value Ref Range   Glucose-Capillary 276 (H) 65 - 99 mg/dL  Glucose, capillary     Status: Abnormal   Collection  Time: 08/16/15  9:49 PM  Result Value Ref Range   Glucose-Capillary 249 (H) 65 - 99 mg/dL  Glucose, capillary     Status: Abnormal   Collection Time: 08/17/15  6:05 AM  Result Value Ref Range   Glucose-Capillary 144 (H) 65 - 99 mg/dL  Glucose, capillary     Status: Abnormal   Collection Time: 08/17/15 11:57 AM  Result Value Ref Range   Glucose-Capillary 201 (H) 65 - 99 mg/dL  Glucose, capillary     Status: Abnormal   Collection Time: 08/17/15  4:55 PM  Result Value Ref Range   Glucose-Capillary 177 (H) 65 - 99 mg/dL  Glucose, capillary     Status: Abnormal   Collection Time: 08/17/15  9:26 PM  Result Value Ref Range   Glucose-Capillary 281 (H) 65 - 99 mg/dL  Glucose, capillary     Status: Abnormal   Collection Time: 08/18/15  6:37 AM  Result Value Ref Range   Glucose-Capillary 187 (H) 65 - 99 mg/dL  Glucose, capillary     Status: Abnormal   Collection Time: 08/18/15 11:41 AM  Result Value Ref Range   Glucose-Capillary 230 (H) 65 - 99 mg/dL   Comment 1 Notify RN   Glucose, capillary     Status: Abnormal   Collection Time: 08/18/15  4:58 PM  Result Value Ref Range   Glucose-Capillary 299 (H) 65 - 99 mg/dL    Blood Alcohol level:  Lab Results  Component Value Date   ETH <5 08/12/2015   ETH <11 09/02/2013    Physical Findings: AIMS: Facial and Oral Movements Muscles of Facial Expression: None, normal Lips and Perioral Area: None, normal Jaw: None, normal Tongue: None, normal,Extremity Movements Upper (arms, wrists, hands, fingers): None,  normal Lower (legs, knees, ankles, toes): None, normal, Trunk Movements Neck, shoulders, hips: None, normal, Overall Severity Severity of abnormal movements (highest score from questions above): None, normal Incapacitation due to abnormal movements: None, normal Patient's awareness of abnormal movements (rate only patient's report): No Awareness, Dental Status Current problems with teeth and/or dentures?: No Does patient usually wear dentures?: No  CIWA:  CIWA-Ar Total: 1 COWS:  COWS Total Score: 1  Musculoskeletal: Strength & Muscle Tone: within normal limits Gait & Station: normal Patient leans: N/A  Psychiatric Specialty Exam: Physical Exam  Nursing note and vitals reviewed. Constitutional: He appears well-developed and well-nourished.  Musculoskeletal: Normal range of motion.  Neurological: He is alert.  Psychiatric: He has a normal mood and affect. His behavior is normal.    Review of Systems  Psychiatric/Behavioral: Negative for depression and hallucinations. The patient is nervous/anxious. The patient does not have insomnia.   All other systems reviewed and are negative.   Blood pressure 137/88, pulse 111, temperature 98 F (36.7 C), temperature source Oral, resp. rate 20, height 5\' 8"  (1.727 m), weight 88.905 kg (196 lb), SpO2 98 %.Body mass index is 29.81 kg/(m^2).  General Appearance: Casual  Eye Contact:  Good  Speech:  Clear and Coherent  Volume:  Normal  Mood:  Depressed  Affect:  Congruent  Thought Process:  Coherent  Orientation:  Full (Time, Place, and Person)  Thought Content:  Hallucinations: None  Suicidal Thoughts:  No  Homicidal Thoughts:  No  Memory:  Immediate;   Fair Recent;   Fair Remote;   Fair  Judgement:  Fair  Insight:  Fair  Psychomotor Activity:  Normal  Concentration:  Concentration: Fair  Recall:  [employer] of Knowledge:  Fair  Language:  Fair  Akathisia:  Yes  Handed:  Right  AIMS (if indicated):     Assets:  Communication  Skills Desire for Improvement Resilience Social Support  ADL's:  Intact  Cognition:  WNL  Sleep:  Number of Hours: 6.75     I agree with current treatment plan on 08/18/2015, Patient seen face-to-face for psychiatric evaluation follow-up, chart reviewed. Reviewed the information documented and agree with the treatment plan.  Treatment Plan Summary: Daily contact with patient to assess and evaluate symptoms and progress in treatment and Medication management  Reviewed past medical records,treatment plan.  Will continue Cymbalta 30 mg po daily for affective sx. Will continue Seroquel - dose reduced to 50 mg po qhs for sleep/psychosis. Will continue Trazodone 50 mg po qhs prn for sleep. Will continue to monitor vitals ,medication compliance and treatment side effects while patient is here.  Will monitor for medical issues as well as call consult as needed.  Reviewed labs- tsh - wnl , lipid panel- wnl, hba1c- wnl . CSW will continue working on disposition. Referral to substance abuse treatment program as needed. Patient to participate in therapeutic milieu .    Oneta Rackanika N Lewis, NP 08/18/2015, 5:24 PM  Reviewed the information documented and agree with the treatment plan.  Anush Wiedeman 08/20/2015 1:33 PM

## 2015-08-19 LAB — GLUCOSE, CAPILLARY
GLUCOSE-CAPILLARY: 159 mg/dL — AB (ref 65–99)
Glucose-Capillary: 244 mg/dL — ABNORMAL HIGH (ref 65–99)

## 2015-08-19 MED ORDER — HYDROXYZINE HCL 25 MG PO TABS: 25.0000 mg | ORAL_TABLET | Freq: Four times a day (QID) | ORAL | Status: DC | PRN

## 2015-08-19 MED ORDER — QUETIAPINE FUMARATE 50 MG PO TABS: 50.0000 mg | ORAL_TABLET | Freq: Every day | ORAL | Status: DC

## 2015-08-19 MED ORDER — DULOXETINE HCL 30 MG PO CPEP: 30.0000 mg | ORAL_CAPSULE | Freq: Every day | ORAL | Status: DC

## 2015-08-19 MED ORDER — LISINOPRIL 20 MG PO TABS
20.0000 mg | ORAL_TABLET | Freq: Every day | ORAL | Status: DC
  Filled 2015-08-19: qty 7

## 2015-08-19 MED ORDER — LEVOFLOXACIN 500 MG PO TABS: 500.0000 mg | ORAL_TABLET | Freq: Every day | ORAL | Status: DC

## 2015-08-19 MED ORDER — INSULIN GLARGINE 100 UNIT/ML ~~LOC~~ SOLN: 35.0000 [IU] | Freq: Every day | SUBCUTANEOUS | Status: AC

## 2015-08-19 NOTE — Progress Notes (Signed)
D: Pt was unusually isolative and withdrawn to his room today. Pt endorsed moderate anxiety and depression; he states, "I may be going home tomorrow; I'm very anxious." Pt denied, pain, AVH, SI or HI. Pt remained calm and cooperative. A: Medications offered as prescribed.  Support, encouragement, and safe environment provided.  15-minute safety checks continue. R: Pt was med compliant.  Pt attended wrap-up group. Safety checks continue

## 2015-08-19 NOTE — BHH Suicide Risk Assessment (Signed)
[facility] Discharge Suicide Risk Assessment   Principal Problem: Major depressive disorder, recurrent severe without psychotic features [facility]) Discharge Diagnoses:  Patient Active Problem List   Diagnosis Date Noted  . Cocaine use disorder, severe, dependence (HCC) [F14.20] 08/15/2015  . Major depressive disorder, recurrent severe without psychotic features (HCC) [F33.2] 09/03/2013  . 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 Pain, wrist, right [M25.531] 07/22/2011  . History of pulmonary embolism [Z86.711] 07/22/2011  . BPH (benign prostatic hyperplasia) [N40.0] 07/22/2011  . Obstructive uropathy [N13.9] 07/22/2011  . HTN (hypertension) [I10] 07/22/2011  . History of DVT of lower extremity [Z86.718] 07/22/2011  . Former cigar smoker [Z87.891] 07/22/2011  . 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 Noncompliance w/medication treatment due to intermit use of medication [Z91.14]   . Pulmonary embolism, bilateral (HCC) [I26.99] 01/12/2011  . DVT of lower limb, acute (HCC) [I82.409] 01/12/2011  . Sinusitis acute [J01.90] 01/12/2011    Total Time spent with patient: 30 minutes  Musculoskeletal: Strength & Muscle Tone: within normal limits Gait & Station: normal Patient leans: N/A  Psychiatric Specialty Exam: ROS  Blood pressure 131/93, pulse 73, temperature 97.5 F (36.4 C), temperature source Oral, resp. rate 16, height  (1.727 m), weight 88.905 kg (196 lb), SpO2 98 %.Body mass index is 29.81 kg/(m^2).  General Appearance: Casual  Eye Contact::  Good  Speech:  Clear and Coherent409  Volume:  Normal  Mood:  Euthymic  Affect:  Appropriate and Congruent  Thought Process:  Coherent and Goal Directed  Orientation:  Full (Time, Place, and Person)  Thought Content:  WDL  Suicidal Thoughts:  No  Homicidal Thoughts:  No  Memory:  Immediate;   Good Recent;   Fair Remote;   Fair  Judgement:  Intact  Insight:  Fair  Psychomotor Activity:  Normal   Concentration:  Good  Recall:  Good  Fund of Knowledge:Good  Language: Good  Akathisia:  Negative  Handed:  Right  AIMS (if indicated):     Assets:  Communication Skills Desire for Improvement Financial Resources/Insurance Housing Intimacy Leisure Time Physical Health Resilience Social Support Talents/Skills Transportation  Sleep:  Number of Hours: 6.75  Cognition: WNL  ADL's:  Intact   Mental Status Per Nursing Assessment::   On Admission:  Self-harm thoughts  Demographic Factors:  Male, Adolescent or young adult, Low socioeconomic status and Unemployed  Loss Factors: NA  Historical Factors: NA  Risk Reduction Factors:   Sense of responsibility to family, Religious beliefs about death, Living with another person, especially a relative, Positive social support, Positive therapeutic relationship and Positive coping skills or problem solving skills  Continued Clinical Symptoms:  Depression:   Comorbid alcohol abuse/dependence Recent sense of peace/wellbeing Alcohol/Substance Abuse/Dependencies Previous Psychiatric Diagnoses and Treatments  Cognitive Features That Contribute To Risk:  Polarized thinking    Suicide Risk:  Minimal: No identifiable suicidal ideation.  Patients presenting with no risk factors but with morbid ruminations; may be classified as minimal risk based on the severity of the depressive symptoms  Follow-up Information    Follow up with [facility].   Why:  Messages left for your primary care and mental health providers. Providers should contact you directly to schedule follow up appts. Please contact office if you have not heard from staff by Wednesday 6/14.   Contact information:   [address] [place], [place] [address] [phone]      Plan Of Care/Follow-up recommendations:  Activity:  As tolerated Diet:  Regular  Leata Mouse, MD  08/19/2015, 12:04 PM

## 2015-08-19 NOTE — BHH Group Notes (Signed)
[facility] Group Notes:  (Nursing/MHT/Case Management/Adjunct)  Date:  08/19/2015  Time:  0900 am  Type of Therapy:  Healthy Support Systems  Participation Level:  Did Not Attend  Patient invited; declined to attend  [name], [name] 08/19/2015, 10:43 AM
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
## 2015-08-19 NOTE — Discharge Summary (Signed)
Physician Discharge Summary Note  Patient:  Jeremy ButtnerJames L Owczarzak is an 50 y.o., male MRN:  [id number] DOB:  [birth year] Patient phone:  [phone] (home)  Patient address:   [address] [place] [id number],  Total Time spent with patient: 45 minutes  Date of Admission:  08/12/2015 Date of Discharge: 08/19/2015  Reason for Admission: Per HPI-[name] is an 50 y.o. male. [facility] addiction. Pt states he is in need of inpatient treatment. Pt reports feeling depressed due to his baby recently being born prematurely. Pt denies SI/HI. Pt denies AVH. [facility] for SA and depression. Pt denies current outpatient treatment. Pt denies current mental health medication. Pt states he uses $200 of crack cocaine a day.    Principal Problem: Major depressive disorder, recurrent severe without psychotic features [facility](HCC) Discharge Diagnoses: Patient Active Problem List   Diagnosis Date Noted  . Cocaine use disorder, severe, dependence (HCC) [F14.20] 08/15/2015  . Major depressive disorder, recurrent severe without psychotic features (HCC) [F33.2] 09/03/2013  . 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 Pain, wrist, right [M25.531] 07/22/2011  . History of pulmonary embolism [Z86.711] 07/22/2011  . BPH (benign prostatic hyperplasia) [N40.0] 07/22/2011  . Obstructive uropathy [N13.9] 07/22/2011  . HTN (hypertension) [I10] 07/22/2011  . History of DVT of lower extremity [Z86.718] 07/22/2011  . Former cigar smoker [Z87.891] 07/22/2011  . 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 Noncompliance w/medication treatment due to intermit use of medication [Z91.14]   . Pulmonary embolism, bilateral (HCC) [I26.99] 01/12/2011  . DVT of lower limb, acute (HCC) [I82.409] 01/12/2011  . Sinusitis acute [J01.90] 01/12/2011    Past Psychiatric History: See Above Past Medical History:  Past Medical History   Diagnosis Date  . DVT (deep venous thrombosis) (HCC) 01/08/2011    lt leg  . DVT of lower limb, acute (HCC) 01/12/2011  . BPH (benign prostatic hyperplasia)   . Current use of long term anticoagulation   . 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 Noncompliance w/medication treatment due to intermit use of medication   . History of tobacco use     QUIT early 2013  . Hypertension   . Depression   . Enlarged prostate   . Diabetes mellitus without complication [facility](HCC)     Past Surgical History  Procedure Laterality Date  . Tonsillectomy    . Bilateral upper extremity angiogram N/A 07/23/2011    Procedure: BILATERAL UPPER EXTREMITY ANGIOGRAM;  Surgeon: Fransisco HertzBrian L Chen, MD;  [facility];  Service: Cardiovascular;  Laterality: N/A;   Family History:  Family History  Problem Relation Age of Onset  . Depression Mother   . Diabetes Mother   . Depression Brother   . Drug abuse Brother    Family Psychiatric  History: See Above Social History:  History  Alcohol Use No    Comment: no alcohol since 09/01/13     History  Drug Use No    Comment: last use 09/01/13    Social History   Social History  . Marital Status: Married    Spouse Name: N/A  . Number of Children: N/A  . Years of Education: N/A   Social History Main Topics  . Smoking status: Former Smoker    Quit date: 07/18/2013  . Smokeless tobacco: Never Used  . Alcohol Use: No     Comment: no alcohol  since 09/01/13  . Drug Use: No     Comment: last use 09/01/13  . Sexual Activity: Yes   Other Topics Concern  . None   Social History Narrative    Hospital Course:  ALISON BREEDING was admitted for Major depressive disorder, recurrent severe without psychotic features (HCC) and crisis management.  Pt was treated discharged with the medications listed below under Medication List.  Medical problems were identified and treated as needed.  Home medications were restarted  as appropriate.  Improvement was monitored by observation and [name] 's daily report of symptom reduction.  Emotional and mental status was monitored by daily self-inventory reports completed by [name] and clinical staff.         [name] was evaluated by the treatment team for stability and plans for continued recovery upon discharge. [name] 's motivation was an integral factor for scheduling further treatment. Employment, transportation, bed availability, health status, family support, and any pending legal issues were also considered during hospital stay. Pt was offered further treatment options upon discharge including but not limited to Residential, Intensive Outpatient, and Outpatient treatment.  [name] will follow up with the services as listed below under Follow Up Information.     Upon completion of this admission the patient was both mentally and medically stable for discharge denying suicidal/homicidal ideation, auditory/visual/tactile hallucinations, delusional thoughts and paranoia.    [name] responded well to treatment with Cymbalta,seroquel, trazodone without adverse effects.  Pt demonstrated improvement without reported or observed adverse effects to the point of stability appropriate for outpatient management. Pertinent labs include: Lipid Panel ,A1c and CMP for which outpatient follow-up is necessary for lab recheck as mentioned below. Reviewed CBC, CMP, BAL, and UDS+Cocaine; all unremarkable aside from noted exceptions.    Physical Findings: AIMS: Facial and Oral Movements Muscles of Facial Expression: None, normal Lips and Perioral Area: None, normal Jaw: None, normal Tongue: None, normal,Extremity Movements Upper (arms, wrists, hands, fingers): None, normal Lower (legs, knees, ankles, toes): None, normal, Trunk Movements Neck, shoulders, hips: None, normal, Overall Severity Severity of abnormal movements (highest score from  questions above): None, normal Incapacitation due to abnormal movements: None, normal Patient's awareness of abnormal movements (rate only patient's report): No Awareness, Dental Status Current problems with teeth and/or dentures?: No Does patient usually wear dentures?: No  CIWA:  CIWA-Ar Total: 1 COWS:  COWS Total Score: 1  Musculoskeletal: Strength & Muscle Tone: within normal limits Gait & Station: normal Patient leans: N/A  Psychiatric Specialty Exam: See SRA BY MD Physical Exam  Nursing note and vitals reviewed. Constitutional: He is oriented to person, place, and time.  Musculoskeletal: Normal range of motion.  Neurological: He is alert and oriented to person, place, and time.  Skin: Skin is warm and dry.  Psychiatric: He has a normal mood and affect. His behavior is normal. Thought content normal.    Review of Systems  Psychiatric/Behavioral: Positive for hallucinations. Negative for suicidal ideas. Depression: stable. Nervous/anxious: stable. Insomnia: stable.   All other systems reviewed and are negative.   Blood pressure 131/93, pulse 73, temperature 97.5 F (36.4 C), temperature source Oral, resp. rate 16, height 5\' 8"  (1.727 m), weight 88.905 kg (196 lb), SpO2 98 %.Body mass index is 29.81 kg/(m^2).    Have you used any form of tobacco in the last 30 days? (Cigarettes, Smokeless Tobacco, Cigars, and/or Pipes): No  Has this patient used any form of tobacco in  the last 30 days? (Cigarettes, Smokeless Tobacco, Cigars, and/or Pipes) Yes, Yes, A prescription for an FDA-approved tobacco cessation medication was offered at discharge and the patient refused  Blood Alcohol level:  Lab Results  Component Value Date   [facility] <5 08/12/2015   ETH <11 09/02/2013    Metabolic Disorder Labs:  Lab Results  Component Value Date   HGBA1C 8.8* 08/16/2015   MPG 206 08/16/2015   No results found for: PROLACTIN Lab Results  Component Value Date   CHOL 182 08/16/2015   TRIG 180*  08/16/2015   HDL 39* 08/16/2015   CHOLHDL 4.7 08/16/2015   VLDL 36 08/16/2015   LDLCALC 107* 08/16/2015    See Psychiatric Specialty Exam and Suicide Risk Assessment completed by Attending Physician prior to discharge.  Discharge destination:  Home  Is patient on multiple antipsychotic therapies at discharge:  No   Has Patient had three or more failed trials of antipsychotic monotherapy by history:  No  Recommended Plan for Multiple Antipsychotic Therapies: NA  Discharge Instructions    Activity as tolerated - No restrictions    Complete by:  As directed      Diet general    Complete by:  As directed             Medication List    STOP taking these medications        acetaminophen 650 MG CR tablet  Commonly known as:  TYLENOL     B-12 PO     benzonatate 100 MG capsule  Commonly known as:  TESSALON     HYDROcodone-acetaminophen 5-325 MG tablet  Commonly known as:  NORCO     multivitamin with minerals Tabs tablet     traMADol 50 MG tablet  Commonly known as:  ULTRAM      TAKE these medications      Indication   DULoxetine 30 MG capsule  Commonly known as:  CYMBALTA  Take 1 capsule (30 mg total) by mouth daily.   Indication:  Major Depressive Disorder, Musculoskeletal Pain     fluticasone 50 MCG/ACT nasal spray  Commonly known as:  FLONASE  Place 1 spray into both nostrils daily.      glipiZIDE 5 MG tablet  Commonly known as:  GLUCOTROL  Take 5 mg by mouth 2 (two) times daily before a meal.      hydrOXYzine 25 MG tablet  Commonly known as:  ATARAX/VISTARIL  Take 1 tablet (25 mg total) by mouth every 6 (six) hours as needed for anxiety.   Indication:  Anxiety Neurosis     insulin glargine 100 UNIT/ML injection  Commonly known as:  LANTUS  Inject 0.35 mLs (35 Units total) into the skin at bedtime.   Indication:  Type 2 Diabetes     levofloxacin 500 MG tablet  Commonly known as:  LEVAQUIN  Take 1 tablet (500 mg total) by mouth daily.   Indication:   Infection     lisinopril 20 MG tablet  Commonly known as:  PRINIVIL,ZESTRIL  Take 20 mg by mouth daily.      metFORMIN 750 MG 24 hr tablet  Commonly known as:  GLUCOPHAGE-XR  Take 750 mg by mouth daily with breakfast.      multivitamin with minerals tablet  Take 1 tablet by mouth daily.      pravastatin 20 MG tablet  Commonly known as:  PRAVACHOL  Take 20 mg by mouth daily.      QUEtiapine 50 MG tablet  Commonly  known as:  SEROQUEL  Take 1 tablet (50 mg total) by mouth at bedtime.   Indication:  Psychosis     rivaroxaban 20 MG Tabs tablet  Commonly known as:  XARELTO  Take 1 tablet (20 mg total) by mouth daily with supper.   Indication:  Blood Clot in a Deep Vein     tamsulosin 0.4 MG Caps capsule  Commonly known as:  FLOMAX  Take 1 capsule (0.4 mg total) by mouth at bedtime.   Indication:  Enlarged Prostate with Urination Problems           Follow-up Information    Follow up with [facility].   Why:  Messages left for your primary care and mental health providers. Providers should contact you directly to schedule follow up appts. Please contact office if you have not heard from staff by Wednesday 6/14.   Contact information:   [address] [place], [place] [address] [phone]      Follow-up recommendations:  Activity:  as tolerated Diet:  heart healthy as prdx by Primary Care Provider    Comments:  Take all medications as prescribed. Keep all follow-up appointments as scheduled.  Do not consume alcohol or use illegal drugs while on prescription medications. Report any adverse effects from your medications to your primary care provider promptly.  In the event of recurrent symptoms or worsening symptoms, call 911, a crisis hotline, or go to the nearest emergency department for evaluation.   Signed: Oneta Rack, NP 08/19/2015, 8:11 AM  Patient seen face-to-face for this evaluation, case discussed with the treatment team, physician  extender and completed discharge suicide risk assessment and disposition plan. Reviewed the information documented and agree with the treatment plan.   Addalie Calles [facility] 08/19/2015 3:29 PM

## 2015-08-19 NOTE — Progress Notes (Signed)
Patient ID: Jeremy ButtnerJames L Thomas, male   DOB: December 07, 1965, 50 y.o.   MRN: [id number] [name] was discharged to lobby, per MD order. AVS, SRA, discharge summary were given to pt and reviewed. Scripts were given to pt and belongings returned. Pt was given opportunity to ask questions. Pt verbalized understanding and readiness for discharge. Appeared euthymic and in NAD.

## 2015-08-19 NOTE — Progress Notes (Signed)
D: Patient's discharge paperwork initiated.  He states he somewhat anxious about going home.  He states, "I'll be so glad to see by baby daughter."  Patient has been interacting well with staff and his peers.  He denies SI/HI/AVH.  He requested a letter from the social worker to return to work.  He rates his depression and hopelessness as a 0; anxiety as a 5.   A: Continue to monitor medication management and MD orders.  Safety checks completed every 15 minutes per protocol.  Offer support and encouragement as needed. R: Patient is receptive to staff; his behavior is appropriate.

## 2015-10-10 ENCOUNTER — Inpatient Hospital Stay (HOSPITAL_COMMUNITY): Admission: AD | Admit: 2015-10-10 | Payer: Federal, State, Local not specified - Other | Admitting: Psychiatry

## 2015-10-10 ENCOUNTER — Emergency Department (HOSPITAL_COMMUNITY)
Admission: EM | Admit: 2015-10-10 | Discharge: 2015-10-11 | Disposition: A | Discharge status: Home / Self Care | Payer: Non-veteran care | Attending: Emergency Medicine | Admitting: Emergency Medicine

## 2015-10-10 ENCOUNTER — Encounter (HOSPITAL_COMMUNITY): Payer: Self-pay | Admitting: Emergency Medicine

## 2015-10-10 DIAGNOSIS — I10 Essential (primary) hypertension: Secondary | ICD-10-CM | POA: Insufficient documentation

## 2015-10-10 DIAGNOSIS — Z7951 Long term (current) use of inhaled steroids: Secondary | ICD-10-CM | POA: Insufficient documentation

## 2015-10-10 DIAGNOSIS — Z87891 Personal history of nicotine dependence: Secondary | ICD-10-CM | POA: Insufficient documentation

## 2015-10-10 DIAGNOSIS — Z7984 Long term (current) use of oral hypoglycemic drugs: Secondary | ICD-10-CM | POA: Insufficient documentation

## 2015-10-10 DIAGNOSIS — F1414 Cocaine abuse with cocaine-induced mood disorder: Secondary | ICD-10-CM

## 2015-10-10 DIAGNOSIS — Z794 Long term (current) use of insulin: Secondary | ICD-10-CM | POA: Insufficient documentation

## 2015-10-10 DIAGNOSIS — R45851 Suicidal ideations: Secondary | ICD-10-CM

## 2015-10-10 DIAGNOSIS — Z79899 Other long term (current) drug therapy: Secondary | ICD-10-CM | POA: Insufficient documentation

## 2015-10-10 DIAGNOSIS — E119 Type 2 diabetes mellitus without complications: Secondary | ICD-10-CM | POA: Insufficient documentation

## 2015-10-10 DIAGNOSIS — F1410 Cocaine abuse, uncomplicated: Secondary | ICD-10-CM

## 2015-10-10 DIAGNOSIS — F32A Depression, unspecified: Secondary | ICD-10-CM

## 2015-10-10 DIAGNOSIS — F329 Major depressive disorder, single episode, unspecified: Secondary | ICD-10-CM

## 2015-10-10 LAB — CBC
HEMATOCRIT: 43.4 % (ref 39.0–52.0)
Hemoglobin: 15.3 g/dL (ref 13.0–17.0)
MCH: 32.1 pg (ref 26.0–34.0)
MCHC: 35.3 g/dL (ref 30.0–36.0)
MCV: 91 fL (ref 78.0–100.0)
Platelets: 286 10*3/uL (ref 150–400)
RBC: 4.77 MIL/uL (ref 4.22–5.81)
RDW: 12.8 % (ref 11.5–15.5)
WBC: 6.5 10*3/uL (ref 4.0–10.5)

## 2015-10-10 LAB — ACETAMINOPHEN LEVEL

## 2015-10-10 LAB — COMPREHENSIVE METABOLIC PANEL
ALBUMIN: 4.4 g/dL (ref 3.5–5.0)
ALT: 55 U/L (ref 17–63)
ANION GAP: 8 (ref 5–15)
AST: 41 U/L (ref 15–41)
Alkaline Phosphatase: 104 U/L (ref 38–126)
BUN: 19 mg/dL (ref 6–20)
CHLORIDE: 104 mmol/L (ref 101–111)
CO2: 23 mmol/L (ref 22–32)
Calcium: 9 mg/dL (ref 8.9–10.3)
Creatinine, Ser: 1.11 mg/dL (ref 0.61–1.24)
GFR calc Af Amer: >60 mL/min (ref >60)
GFR calc non Af Amer: >60 mL/min (ref >60)
GLUCOSE: 193 mg/dL — AB (ref 65–99)
POTASSIUM: 4.2 mmol/L (ref 3.5–5.1)
SODIUM: 135 mmol/L (ref 135–145)
Total Bilirubin: 0.5 mg/dL (ref 0.3–1.2)
Total Protein: 7.6 g/dL (ref 6.5–8.1)

## 2015-10-10 LAB — RAPID URINE DRUG SCREEN, HOSP PERFORMED
AMPHETAMINES: NOT DETECTED
BARBITURATES: NOT DETECTED
BENZODIAZEPINES: NOT DETECTED
COCAINE: POSITIVE — AB
Opiates: NOT DETECTED
Tetrahydrocannabinol: NOT DETECTED

## 2015-10-10 LAB — CBG MONITORING, ED
Glucose-Capillary: 193 mg/dL — ABNORMAL HIGH (ref 65–99)
Glucose-Capillary: 159 mg/dL — ABNORMAL HIGH (ref 65–99)
Glucose-Capillary: 152 mg/dL — ABNORMAL HIGH (ref 65–99)

## 2015-10-10 LAB — ETHANOL: Alcohol, Ethyl (B): <5 mg/dL (ref <5)

## 2015-10-10 LAB — SALICYLATE LEVEL: Salicylate Lvl: <4 mg/dL (ref 2.8–30.0)

## 2015-10-10 MED ORDER — GLIPIZIDE 5 MG PO TABS
5.0000 mg | ORAL_TABLET | Freq: Two times a day (BID) | ORAL | Status: DC
  Administered 2015-10-10 – 2015-10-11 (×2): 5 mg via ORAL
  Filled 2015-10-10 (×2): qty 1

## 2015-10-10 MED ORDER — ZOLPIDEM TARTRATE 5 MG PO TABS: 5.0000 mg | ORAL_TABLET | Freq: Every evening | ORAL | Status: DC | PRN

## 2015-10-10 MED ORDER — DULOXETINE HCL 30 MG PO CPEP
30.0000 mg | ORAL_CAPSULE | Freq: Every day | ORAL | Status: DC
  Administered 2015-10-10 – 2015-10-11 (×2): 30 mg via ORAL
  Filled 2015-10-10 (×2): qty 1

## 2015-10-10 MED ORDER — LISINOPRIL 20 MG PO TABS
20.0000 mg | ORAL_TABLET | Freq: Every day | ORAL | Status: DC
  Administered 2015-10-10 – 2015-10-11 (×2): 20 mg via ORAL
  Filled 2015-10-10 (×2): qty 1

## 2015-10-10 MED ORDER — ACETAMINOPHEN 325 MG PO TABS: 650.0000 mg | ORAL_TABLET | ORAL | Status: DC | PRN

## 2015-10-10 MED ORDER — HYDROXYZINE HCL 25 MG PO TABS: 25.0000 mg | ORAL_TABLET | Freq: Four times a day (QID) | ORAL | Status: DC | PRN

## 2015-10-10 MED ORDER — INSULIN ASPART 100 UNIT/ML ~~LOC~~ SOLN
0.0000 [IU] | Freq: Three times a day (TID) | SUBCUTANEOUS | Status: DC
  Administered 2015-10-10 – 2015-10-11 (×2): 3 [IU] via SUBCUTANEOUS
  Filled 2015-10-10 (×2): qty 1

## 2015-10-10 MED ORDER — IBUPROFEN 200 MG PO TABS
600.0000 mg | ORAL_TABLET | Freq: Three times a day (TID) | ORAL | Status: DC | PRN
  Administered 2015-10-10: 600 mg via ORAL
  Filled 2015-10-10: qty 3

## 2015-10-10 MED ORDER — QUETIAPINE FUMARATE 50 MG PO TABS
50.0000 mg | ORAL_TABLET | Freq: Every day | ORAL | Status: DC
  Administered 2015-10-10: 50 mg via ORAL
  Filled 2015-10-10: qty 1

## 2015-10-10 MED ORDER — RIVAROXABAN 20 MG PO TABS
20.0000 mg | ORAL_TABLET | Freq: Every day | ORAL | Status: DC
  Administered 2015-10-10: 20 mg via ORAL
  Filled 2015-10-10 (×2): qty 1

## 2015-10-10 MED ORDER — INSULIN GLARGINE 100 UNIT/ML ~~LOC~~ SOLN
35.0000 [IU] | Freq: Every day | SUBCUTANEOUS | Status: DC
  Administered 2015-10-10: 35 [IU] via SUBCUTANEOUS
  Filled 2015-10-10: qty 0.35

## 2015-10-10 MED ORDER — LORAZEPAM 1 MG PO TABS: 1.0000 mg | ORAL_TABLET | Freq: Three times a day (TID) | ORAL | Status: DC | PRN

## 2015-10-10 MED ORDER — TAMSULOSIN HCL 0.4 MG PO CAPS
0.4000 mg | ORAL_CAPSULE | Freq: Every day | ORAL | Status: DC
  Administered 2015-10-10: 0.4 mg via ORAL
  Filled 2015-10-10: qty 1

## 2015-10-10 MED ORDER — METFORMIN HCL ER 750 MG PO TB24
750.0000 mg | ORAL_TABLET | Freq: Every day | ORAL | Status: DC
  Administered 2015-10-11: 750 mg via ORAL
  Filled 2015-10-10: qty 1

## 2015-10-10 MED ORDER — ALUM & MAG HYDROXIDE-SIMETH 200-200-20 MG/5ML PO SUSP: 30.0000 mL | ORAL | Status: DC | PRN

## 2015-10-10 MED ORDER — ONDANSETRON HCL 4 MG PO TABS: 4.0000 mg | ORAL_TABLET | Freq: Three times a day (TID) | ORAL | Status: DC | PRN

## 2015-10-10 NOTE — ED Provider Notes (Signed)
[facility]-EMERGENCY DEPT Provider Note   CSN: [id number] Arrival date & time: 10/10/15  1442  First Provider Contact:  None       History   Chief Complaint Chief Complaint  Patient presents with  . IVC  . Suicidal    HPI [name] is a 50 y.o. male.  Patient is sent to [place] from [facility] for increasing depression and suicidal thoughts as well as ongoing cocaine use.  The patient reports vague suicidal thoughts without an active plan.  He does admit to cocaine abuse.  He denies alcohol abuse.  He denies hallucinations.  No other complaints at this time.  Patient is under IVC is taken out by the staff at [facility]   The history is provided by the patient and medical records.    Past Medical History:  Diagnosis Date  . 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 BPH (benign prostatic hyperplasia)   . Current use of long term anticoagulation   . Depression   . Diabetes mellitus without complication (HCC)   . DVT (deep venous thrombosis) (HCC) 01/08/2011   lt leg  . DVT of lower limb, acute (HCC) 01/12/2011  . Enlarged prostate   . History of tobacco use    QUIT early 2013  . Hypertension   . Noncompliance w/medication treatment due to intermit use of medication     Patient Active Problem List   Diagnosis Date Noted  . Cocaine use disorder, severe, dependence (HCC) 08/15/2015  . Major depressive disorder, recurrent severe without psychotic features (HCC) 09/03/2013  . Thrombosis/embolism, arterial (HCC) 07/22/2011  . Pain, wrist, right 07/22/2011  . History of pulmonary embolism 07/22/2011  . BPH (benign prostatic hyperplasia) 07/22/2011  . Obstructive uropathy 07/22/2011  . HTN (hypertension) 07/22/2011  . History of DVT of lower extremity 07/22/2011  . Former cigar smoker 07/22/2011  . 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 Noncompliance w/medication treatment due to intermit use of medication   . Pulmonary embolism,  bilateral (HCC) 01/12/2011  . DVT of lower limb, acute (HCC) 01/12/2011  . Sinusitis acute 01/12/2011    Past Surgical History:  Procedure Laterality Date  . BILATERAL UPPER EXTREMITY ANGIOGRAM N/A 07/23/2011   Procedure: BILATERAL UPPER EXTREMITY ANGIOGRAM;  Surgeon: Fransisco Hertz, MD;  Location: [facility] CATH LAB;  Service: Cardiovascular;  Laterality: N/A;  . TONSILLECTOMY         Home Medications    Prior to Admission medications   Medication Sig Start Date End Date Taking? Authorizing Provider  DULoxetine (CYMBALTA) 30 MG capsule Take 1 capsule (30 mg total) by mouth daily. 08/19/15   Oneta Rack, NP  fluticasone (FLONASE) 50 MCG/ACT nasal spray Place 1 spray into both nostrils daily. Patient taking differently: Place 1 spray into both nostrils daily as needed for allergies.  01/25/15   [name], MD  glipiZIDE (GLUCOTROL) 5 MG tablet Take 5 mg by mouth 2 (two) times daily before a meal.    Historical Provider, MD  hydrOXYzine (ATARAX/VISTARIL) 25 MG tablet Take 1 tablet (25 mg total) by mouth every 6 (six) hours as needed for anxiety. 08/19/15   Oneta Rack, NP  insulin glargine (LANTUS) 100 UNIT/ML injection Inject 0.35 mLs (35 Units total) into the skin at bedtime. 08/19/15   Oneta Rack, NP  levofloxacin (LEVAQUIN) 500 MG tablet Take 1 tablet (500 mg total) by mouth daily. 08/19/15   Oneta Rack, NP  lisinopril (PRINIVIL,ZESTRIL)  20 MG tablet Take 20 mg by mouth daily.    Historical Provider, MD  metFORMIN (GLUCOPHAGE-XR) 750 MG 24 hr tablet Take 750 mg by mouth daily with breakfast.    Historical Provider, MD  Multiple Vitamins-Minerals (MULTIVITAMIN WITH MINERALS) tablet Take 1 tablet by mouth daily. 09/08/13   [name], NP  pravastatin (PRAVACHOL) 20 MG tablet Take 20 mg by mouth daily.     Historical Provider, MD  QUEtiapine (SEROQUEL) 50 MG tablet Take 1 tablet (50 mg total) by mouth at bedtime. 08/19/15   Oneta Rack, NP  rivaroxaban (XARELTO) 20 MG  TABS tablet Take 1 tablet (20 mg total) by mouth daily with supper. Patient taking differently: Take 20 mg by mouth daily.  09/08/13   [name], NP  tamsulosin (FLOMAX) 0.4 MG CAPS capsule Take 1 capsule (0.4 mg total) by mouth at bedtime. 09/08/13   [name], NP    Family History Family History  Problem Relation Age of Onset  . Depression Mother   . Diabetes Mother   . Depression Brother   . Drug abuse Brother     Social History Social History  Substance Use Topics  . Smoking status: Former Smoker    Quit date: 07/18/2013  . Smokeless tobacco: Never Used  . Alcohol use No     Comment: no alcohol since 09/01/13     Allergies   Review of patient's allergies indicates no known allergies.   Review of Systems Review of Systems  All other systems reviewed and are negative.    Physical Exam Updated Vital Signs BP 132/73 (BP Location: Left Arm)   Pulse 78   Temp 98.1 F (36.7 C) (Oral)   Resp 18   SpO2 97%   Physical Exam  Constitutional: He is oriented to person, place, and time. He appears well-developed and well-nourished.  HENT:  Head: Normocephalic.  Eyes: EOM are normal.  Neck: Normal range of motion.  Pulmonary/Chest: Effort normal.  Abdominal: He exhibits no distension.  Musculoskeletal: Normal range of motion.  Neurological: He is alert and oriented to person, place, and time.  Psychiatric:  Depressed mood.  Suicidal thoughts  Nursing note and vitals reviewed.    ED Treatments / Results  Labs (all labs ordered are listed, but only abnormal results are displayed) Labs Reviewed  COMPREHENSIVE METABOLIC PANEL  ETHANOL  SALICYLATE LEVEL  ACETAMINOPHEN LEVEL  CBC  URINE RAPID DRUG SCREEN, HOSP PERFORMED    EKG  EKG Interpretation None       Radiology No results found.  Procedures Procedures (including critical care time)  Medications Ordered in ED Medications - No data to display   Initial Impression / Assessment and Plan /  ED Course  I have reviewed the triage vital signs and the nursing notes.  Pertinent labs & imaging results that were available during my care of the patient were reviewed by me and considered in my medical decision making (see chart for details).  Clinical Course    Medically clear at this time.  TTS to evaluate.  I will follow-up on his laboratory studies.  Final Clinical Impressions(s) / ED Diagnoses   Final diagnoses:  None    New Prescriptions New Prescriptions   No medications on file     Azalia Bilis, MD 10/10/15 1521

## 2015-10-10 NOTE — ED Triage Notes (Signed)
Patient presents under IVC. Reports SI without plan and VH (shadows). Denies HI/AH. Last used cocaine yesterday, denies ETOH.

## 2015-10-10 NOTE — ED Notes (Signed)
Patient under IVC, paperwork states "Respondent presents with increased anxiety and depression. Reports that he has been experiencing increased hopelessness and this has lead him to using street drugs excessively. Reports that if he does not get help he will go and take a bunch of medications and kill himself. He reports history of suicide attempts. Respondent is dangerous to himself.
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
## 2015-10-10 NOTE — ED Notes (Signed)
Pt came to [facility] from [place] as IVC. Pt reports that he hears ringing and sees shadows. Pt lives with his wife and four children. He says that he is a veteran with a hx of diabetes, DVT and HTN. Pt has si with no plan and denies hi. He has been using cocaine.

## 2015-10-10 NOTE — BH Assessment (Addendum)
Patient meets criteria for INPT treatment, per Jamison Lord, DNP.  

## 2015-10-10 NOTE — ED Notes (Signed)
Pt changed into scrubs and wanded by security  

## 2015-10-10 NOTE — ED Notes (Signed)
Wife at bedside visiting with pt. AAO x 3, no distress noted, interactive with staff.  Monitoring for safety, Q 15 min checks in effect.

## 2015-10-11 DIAGNOSIS — F1414 Cocaine abuse with cocaine-induced mood disorder: Secondary | ICD-10-CM | POA: Diagnosis not present

## 2015-10-11 LAB — CBG MONITORING, ED: GLUCOSE-CAPILLARY: 185 mg/dL — AB (ref 65–99)

## 2015-10-11 MED ORDER — RIVAROXABAN 20 MG PO TABS: 20.0000 mg | ORAL_TABLET | Freq: Every day | ORAL | Status: DC

## 2015-10-11 NOTE — Discharge Instructions (Addendum)
For your ongoing behavioral health needs, you are advised to follow up with the [facility] [facility]:       [facility]      [address] [place], [place] [address]      [phone]  If for any reason you are not able to go this clinic, consider [facility].  New and returning patients are seen at their walk-in clinic.  Walk-in hours are Monday - Friday from 8:00 am - 3:00 pm.  Walk-in patients are seen on a first come, first served basis.  Try to arrive as early as possible for he best chance of being seen the same day:       [facility]      [address]      [place], [place] [address]      [phone]  To help you maintain a sober lifestyle, a substance abuse treatment program may be beneficial to you.  Contact one of the following treatment providers at your earliest opportunity to ask about enrolling in their program:       [facility]      [address] [place], [place] [address]      [phone]       [facility]      [address] [place], [place] [address]      [phone]       [facility]      [address]      [place], [place] [address]      [phone]

## 2015-10-11 NOTE — ED Notes (Signed)
Patient resting quietly on his bed watching television at this time.

## 2015-10-11 NOTE — ED Notes (Signed)
No 1st Opinion for IVC papers at this time.  TTS and [facility] Tori aware.

## 2015-10-11 NOTE — BH Assessment (Addendum)
[facility] Assessment Progress Note  Per Thedore Mins, MD, this pt does not require psychiatric hospitalization at this time.  Pt presents under IVC, which Dr Jannifer Franklin has rescinded.  Pt is to be discharged from [facility] with recommendation to follow up with either the [facility] or [facility] [place].  This has been included in pt's discharge instructions.  Pt's nurse, Dawnaly, has been notified.  Doylene Canning, MA Triage Specialist [phone]   Addendum:  Per Rudean Hitt, pt has requested information about area residential substance abuse treatment providers.  Referral information for [facility], [facility], and [facility] has been included in pt's discharge instructions.  Dawnaly has been notified.  Doylene Canning, MA Triage Specialist [phone]
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
## 2015-10-11 NOTE — ED Notes (Signed)
Patient met with MD and DNP.  He was vague with some of his answers, but indicated [facility] and [facility].  After they left the room he called me back in and stated he could not be discharged to home, that he needed to be inpatient somewhere.  Advised I would pass the information along.

## 2015-10-11 NOTE — ED Notes (Signed)
Patient discharged to home with resources to [facility] and for residential rehab if he chooses to go there.  All belongings returned and signed for.  Patient left the unit ambulatory and was escorted to the front lobby.

## 2015-10-11 NOTE — [facility] Suicide Risk Assessment (Signed)
Suicide Risk Assessment  Discharge Assessment   [facility] Discharge Suicide Risk Assessment   Principal Problem: Cocaine abuse with cocaine-induced mood disorder [facility]) Discharge Diagnoses:  Patient Active Problem List   Diagnosis Date Noted  . Cocaine abuse with cocaine-induced mood disorder (HCC) [F14.14] 10/11/2015    Priority: High  . Cocaine use disorder, severe, dependence ([facility]) [F14.20] 08/15/2015  . Major depressive disorder, recurrent severe without psychotic features ([facility]) [F33.2] 09/03/2013  . 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 Pain, wrist, right [M25.531] 07/22/2011  . History of pulmonary embolism [Z86.711] 07/22/2011  . BPH (benign prostatic hyperplasia) [N40.0] 07/22/2011  . Obstructive uropathy [N13.9] 07/22/2011  . HTN (hypertension) [I10] 07/22/2011  . History of DVT of lower extremity [Z86.718] 07/22/2011  . Former cigar smoker [Z87.891] 07/22/2011  . 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 Noncompliance w/medication treatment due to intermit use of medication [Z91.14]   . Pulmonary embolism, bilateral (HCC) [I26.99] 01/12/2011  . DVT of lower limb, acute (HCC) [I82.409] 01/12/2011  . Sinusitis acute [J01.90] 01/12/2011    Total Time spent with patient: 45 minutes  Musculoskeletal: Strength & Muscle Tone: within normal limits Gait & Station: normal Patient leans: N/A  Psychiatric Specialty Exam: Physical Exam  Constitutional: He is oriented to person, place, and time. He appears well-developed and well-nourished.  HENT:  Head: Normocephalic.  Neck: Normal range of motion.  Respiratory: Effort normal.  Musculoskeletal: Normal range of motion.  Neurological: He is alert and oriented to person, place, and time.  Skin: Skin is warm and dry.  Psychiatric: His speech is normal and behavior is normal. Judgment and thought content normal. Cognition and memory are normal. He exhibits a depressed mood.    Review of Systems   Constitutional: Negative.   HENT: Negative.   Eyes: Negative.   Respiratory: Negative.   Cardiovascular: Negative.   Gastrointestinal: Negative.   Genitourinary: Negative.   Musculoskeletal: Negative.   Skin: Negative.   Neurological: Negative.   Endo/Heme/Allergies: Negative.   Psychiatric/Behavioral: Positive for depression and substance abuse.    Blood pressure 113/69, pulse 62, temperature 97.9 F (36.6 C), temperature source Oral, resp. rate 16, SpO2 96 %.There is no height or weight on file to calculate BMI.  General Appearance: Casual  Eye Contact:  Good  Speech:  Normal Rate  Volume:  Normal  Mood:  Depressed  Affect:  Congruent  Thought Process:  Coherent and Descriptions of Associations: Intact  Orientation:  Full (Time, Place, and Person)  Thought Content:  WDL  Suicidal Thoughts:  No  Homicidal Thoughts:  No  Memory:  Immediate;   Good Recent;   Good Remote;   Good  Judgement:  Fair  Insight:  Fair  Psychomotor Activity:  Normal  Concentration:  Concentration: Good and Attention Span: Good  Recall:  Good  Fund of Knowledge:  Good  Language:  Good  Akathisia:  No  Handed:  Right  AIMS (if indicated):     Assets:  Communication Skills Housing Intimacy Leisure Time Physical Health Resilience Social Support Transportation  ADL's:  Intact  Cognition:  WNL  Sleep:      Mental Status Per Nursing Assessment::   On Admission:   cocaine abuse with suicidal ideations  Demographic Factors:  Male  Loss Factors: NA  Historical Factors: NA  Risk Reduction Factors:   Responsible for children under 18 years of age, Sense of responsibility to family, Living with another person, especially a relative, Positive social support and Positive therapeutic relationship  Continued Clinical Symptoms:  Depression, mild to moderate  Cognitive Features That Contribute To Risk:  None    Suicide Risk:  Minimal: No identifiable suicidal ideation.  Patients  presenting with no risk factors but with morbid ruminations; may be classified as minimal risk based on the severity of the depressive symptoms    Plan Of Care/Follow-up recommendations:  Activity:  as tolerated Diet:  heart healthy diet  Martino Tompson, NP 10/11/2015, 9:31 AM

## 2015-10-22 ENCOUNTER — Encounter (HOSPITAL_COMMUNITY): Payer: Self-pay | Admitting: Emergency Medicine

## 2015-10-22 ENCOUNTER — Emergency Department (HOSPITAL_COMMUNITY)
Admission: EM | Admit: 2015-10-22 | Discharge: 2015-10-23 | Disposition: A | Discharge status: Home / Self Care | Payer: Non-veteran care | Attending: Emergency Medicine | Admitting: Emergency Medicine

## 2015-10-22 ENCOUNTER — Emergency Department (HOSPITAL_COMMUNITY): Payer: Non-veteran care

## 2015-10-22 DIAGNOSIS — Z7901 Long term (current) use of anticoagulants: Secondary | ICD-10-CM | POA: Diagnosis not present

## 2015-10-22 DIAGNOSIS — M25562 Pain in left knee: Secondary | ICD-10-CM | POA: Diagnosis not present

## 2015-10-22 DIAGNOSIS — R45851 Suicidal ideations: Secondary | ICD-10-CM | POA: Diagnosis not present

## 2015-10-22 DIAGNOSIS — Z046 Encounter for general psychiatric examination, requested by authority: Secondary | ICD-10-CM | POA: Insufficient documentation

## 2015-10-22 DIAGNOSIS — R4689 Other symptoms and signs involving appearance and behavior: Secondary | ICD-10-CM

## 2015-10-22 DIAGNOSIS — E119 Type 2 diabetes mellitus without complications: Secondary | ICD-10-CM | POA: Insufficient documentation

## 2015-10-22 DIAGNOSIS — Z87891 Personal history of nicotine dependence: Secondary | ICD-10-CM | POA: Diagnosis not present

## 2015-10-22 DIAGNOSIS — F919 Conduct disorder, unspecified: Secondary | ICD-10-CM | POA: Diagnosis not present

## 2015-10-22 DIAGNOSIS — Z7984 Long term (current) use of oral hypoglycemic drugs: Secondary | ICD-10-CM | POA: Insufficient documentation

## 2015-10-22 DIAGNOSIS — I10 Essential (primary) hypertension: Secondary | ICD-10-CM | POA: Insufficient documentation

## 2015-10-22 DIAGNOSIS — Z79899 Other long term (current) drug therapy: Secondary | ICD-10-CM | POA: Insufficient documentation

## 2015-10-22 DIAGNOSIS — F1990 Other psychoactive substance use, unspecified, uncomplicated: Secondary | ICD-10-CM

## 2015-10-22 LAB — COMPREHENSIVE METABOLIC PANEL
ALBUMIN: 4.3 g/dL (ref 3.5–5.0)
ALT: 43 U/L (ref 17–63)
AST: 25 U/L (ref 15–41)
Alkaline Phosphatase: 83 U/L (ref 38–126)
Anion gap: 12 (ref 5–15)
BUN: 18 mg/dL (ref 6–20)
CHLORIDE: 104 mmol/L (ref 101–111)
CO2: 19 mmol/L — AB (ref 22–32)
Calcium: 9.7 mg/dL (ref 8.9–10.3)
Creatinine, Ser: 1.51 mg/dL — ABNORMAL HIGH (ref 0.61–1.24)
GFR calc Af Amer: >60 mL/min (ref >60)
GFR, EST NON AFRICAN AMERICAN: 52 mL/min — AB (ref >60)
GLUCOSE: 227 mg/dL — AB (ref 65–99)
POTASSIUM: 4.3 mmol/L (ref 3.5–5.1)
SODIUM: 135 mmol/L (ref 135–145)
Total Bilirubin: 0.6 mg/dL (ref 0.3–1.2)
Total Protein: 7.4 g/dL (ref 6.5–8.1)

## 2015-10-22 LAB — CBC
HEMATOCRIT: 46.5 % (ref 39.0–52.0)
HEMOGLOBIN: 16.3 g/dL (ref 13.0–17.0)
MCH: 32.7 pg (ref 26.0–34.0)
MCHC: 35.1 g/dL (ref 30.0–36.0)
MCV: 93.4 fL (ref 78.0–100.0)
Platelets: 279 10*3/uL (ref 150–400)
RBC: 4.98 MIL/uL (ref 4.22–5.81)
RDW: 12.9 % (ref 11.5–15.5)
WBC: 7.2 10*3/uL (ref 4.0–10.5)

## 2015-10-22 LAB — RAPID URINE DRUG SCREEN, HOSP PERFORMED
Amphetamines: NOT DETECTED
BARBITURATES: NOT DETECTED
Benzodiazepines: NOT DETECTED
COCAINE: POSITIVE — AB
Opiates: NOT DETECTED
TETRAHYDROCANNABINOL: NOT DETECTED

## 2015-10-22 LAB — ETHANOL: ALCOHOL ETHYL (B): 25 mg/dL — AB (ref <5)

## 2015-10-22 MED ORDER — TAMSULOSIN HCL 0.4 MG PO CAPS: 0.4000 mg | ORAL_CAPSULE | Freq: Every day | ORAL | Status: DC

## 2015-10-22 MED ORDER — LISINOPRIL 20 MG PO TABS: 20.0000 mg | ORAL_TABLET | Freq: Every day | ORAL | Status: DC

## 2015-10-22 MED ORDER — HYDROXYZINE HCL 25 MG PO TABS: 25.0000 mg | ORAL_TABLET | Freq: Four times a day (QID) | ORAL | Status: DC | PRN

## 2015-10-22 MED ORDER — RIVAROXABAN 20 MG PO TABS
20.0000 mg | ORAL_TABLET | Freq: Every day | ORAL | Status: DC
  Administered 2015-10-23: 20 mg via ORAL
  Filled 2015-10-22: qty 1

## 2015-10-22 MED ORDER — TAMSULOSIN HCL 0.4 MG PO CAPS: 0.8000 mg | ORAL_CAPSULE | Freq: Every day | ORAL | Status: DC

## 2015-10-22 MED ORDER — THIAMINE HCL 100 MG/ML IJ SOLN: 100.0000 mg | Freq: Every day | INTRAMUSCULAR | Status: DC

## 2015-10-22 MED ORDER — GLIPIZIDE 5 MG PO TABS
5.0000 mg | ORAL_TABLET | Freq: Two times a day (BID) | ORAL | Status: DC
  Administered 2015-10-23: 5 mg via ORAL
  Filled 2015-10-22 (×2): qty 1

## 2015-10-22 MED ORDER — IBUPROFEN 400 MG PO TABS: 600.0000 mg | ORAL_TABLET | Freq: Three times a day (TID) | ORAL | Status: DC | PRN

## 2015-10-22 MED ORDER — RIVAROXABAN 20 MG PO TABS
20.0000 mg | ORAL_TABLET | Freq: Every day | ORAL | Status: DC
  Filled 2015-10-22: qty 1

## 2015-10-22 MED ORDER — ALUM & MAG HYDROXIDE-SIMETH 200-200-20 MG/5ML PO SUSP: 30.0000 mL | ORAL | Status: DC | PRN

## 2015-10-22 MED ORDER — DULOXETINE HCL 60 MG PO CPEP
60.0000 mg | ORAL_CAPSULE | Freq: Every day | ORAL | Status: DC
  Administered 2015-10-22 – 2015-10-23 (×2): 60 mg via ORAL
  Filled 2015-10-22 (×2): qty 1

## 2015-10-22 MED ORDER — LORAZEPAM 1 MG PO TABS: 0.0000 mg | ORAL_TABLET | Freq: Two times a day (BID) | ORAL | Status: DC

## 2015-10-22 MED ORDER — PRAVASTATIN SODIUM 40 MG PO TABS
20.0000 mg | ORAL_TABLET | Freq: Every day | ORAL | Status: DC
  Administered 2015-10-22: 20 mg via ORAL
  Filled 2015-10-22: qty 1

## 2015-10-22 MED ORDER — VITAMIN B-1 100 MG PO TABS
100.0000 mg | ORAL_TABLET | Freq: Every day | ORAL | Status: DC
  Administered 2015-10-22 – 2015-10-23 (×2): 100 mg via ORAL
  Filled 2015-10-22 (×2): qty 1

## 2015-10-22 MED ORDER — ZOLPIDEM TARTRATE 5 MG PO TABS
5.0000 mg | ORAL_TABLET | Freq: Every evening | ORAL | Status: DC | PRN
  Administered 2015-10-22: 5 mg via ORAL
  Filled 2015-10-22: qty 1

## 2015-10-22 MED ORDER — METFORMIN HCL ER 500 MG PO TB24
750.0000 mg | ORAL_TABLET | Freq: Every day | ORAL | Status: DC
  Administered 2015-10-23: 750 mg via ORAL
  Filled 2015-10-22: qty 1.5

## 2015-10-22 MED ORDER — ACETAMINOPHEN 325 MG PO TABS
650.0000 mg | ORAL_TABLET | ORAL | Status: DC | PRN
  Administered 2015-10-22 – 2015-10-23 (×2): 650 mg via ORAL
  Filled 2015-10-22 (×2): qty 2

## 2015-10-22 MED ORDER — LORAZEPAM 1 MG PO TABS
0.0000 mg | ORAL_TABLET | Freq: Four times a day (QID) | ORAL | Status: DC
  Filled 2015-10-22: qty 1

## 2015-10-22 MED ORDER — ONDANSETRON HCL 4 MG PO TABS: 4.0000 mg | ORAL_TABLET | Freq: Three times a day (TID) | ORAL | Status: DC | PRN

## 2015-10-22 MED ORDER — FLUTICASONE PROPIONATE 50 MCG/ACT NA SUSP: 2.0000 | Freq: Every day | NASAL | Status: DC | PRN

## 2015-10-22 MED ORDER — DULOXETINE HCL 30 MG PO CPEP
30.0000 mg | ORAL_CAPSULE | Freq: Every day | ORAL | Status: DC
  Filled 2015-10-22: qty 1

## 2015-10-22 MED ORDER — LORAZEPAM 1 MG PO TABS
1.0000 mg | ORAL_TABLET | Freq: Three times a day (TID) | ORAL | Status: DC | PRN
  Administered 2015-10-22: 1 mg via ORAL

## 2015-10-22 MED ORDER — RISPERIDONE 0.5 MG PO TABS
0.5000 mg | ORAL_TABLET | Freq: Every day | ORAL | Status: DC
  Administered 2015-10-22: 0.5 mg via ORAL
  Filled 2015-10-22: qty 1

## 2015-10-22 MED ORDER — INSULIN GLARGINE 100 UNIT/ML ~~LOC~~ SOLN
35.0000 [IU] | Freq: Every day | SUBCUTANEOUS | Status: DC
  Administered 2015-10-22: 35 [IU] via SUBCUTANEOUS
  Filled 2015-10-22 (×2): qty 0.35

## 2015-10-22 MED ORDER — QUETIAPINE FUMARATE 25 MG PO TABS: 50.0000 mg | ORAL_TABLET | Freq: Every day | ORAL | Status: DC

## 2015-10-22 MED ORDER — NICOTINE 21 MG/24HR TD PT24
21.0000 mg | MEDICATED_PATCH | Freq: Every day | TRANSDERMAL | Status: DC
  Filled 2015-10-22: qty 1

## 2015-10-22 MED ORDER — ADULT MULTIVITAMIN W/MINERALS CH
1.0000 | ORAL_TABLET | Freq: Every day | ORAL | Status: DC
  Administered 2015-10-22 – 2015-10-23 (×2): 1 via ORAL
  Filled 2015-10-22 (×2): qty 1

## 2015-10-22 MED ORDER — LISINOPRIL 10 MG PO TABS
10.0000 mg | ORAL_TABLET | Freq: Every day | ORAL | Status: DC
  Administered 2015-10-23: 10 mg via ORAL
  Filled 2015-10-22: qty 1

## 2015-10-22 NOTE — ED Notes (Signed)
Security came and wanded pt.  

## 2015-10-22 NOTE — ED Notes (Signed)
Sitting at pt's bedside until sitter comes.

## 2015-10-22 NOTE — ED Notes (Signed)
Pharmacy Tech at bedside going over meds.

## 2015-10-22 NOTE — ED Provider Notes (Signed)
[facility]-EMER[facility]ENCY DEPT Provider Note   CSN: [id number] Arrival date & time: 10/22/15  1614     History   Chief Complaint Chief Complaint  Patient presents with  . Post-Traumatic Stress Disorder  . Knee Pain    HPI [name] is a 50 y.o. male.  The history is provided by the patient and medical records.  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 Patient and wife reports that over the past month his behavior has escalated. Patient has always had intermittent issues with depression and anxiety, mostly stemming from his PTSD during his time in the [employer]. States for the past 2 and half years he has been somewhat "normal" but over the past month he has begun displaying split personalities. He states his usual self "[name]" is a good family man who makes good decisions, and his other self "J" has been making stupid decisions and getting into trouble.  He states over the past week he has been using cocaine, drinking, leaving home and wandering around. He reports he stole some pepperoni's and a beer from [employer] earlier today which is very unlike him. His wife reports his tone of voice and behavior has been somewhat aggressive but he has not physically tried to attack her or any other 4 children. He states if he could split apart his personalities he would "kill J in a heartbeat".  He denies any homicidal ideation. He last drank alcohol earlier today, states he is an occasional drinker. He last used cocaine 2 days ago. He denies any physical complaints at this time, specifically no chest pain, shortness breath, abdominal pain, nausea, or vomiting. States he was recently at [facility] [place] for a few day inpatient stay, but reports he feels worse after leaving the facility. He states he was given a prescription for Risperdal but never started this.  Patient's wife is very concerned for his health at this time and she does not feel he is safe to be at home around her or their 4 children at this time.  Patient is hypertensive today-- states he has not had his BP medications in about 5 days.  Past Medical History:  Diagnosis Date  . 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 BPH (benign prostatic hyperplasia)   . Current use of long term anticoagulation   . Depression   . Diabetes mellitus without complication (HCC)   . DVT (deep venous thrombosis) (HCC) 01/08/2011   lt leg  . DVT of lower limb, acute (HCC) 01/12/2011  . Enlarged prostate   . History of tobacco use    QUIT early 2013  . Hypertension   . Noncompliance w/medication treatment due to intermit use of medication     Patient Active Problem List   Diagnosis Date Noted  . Cocaine abuse with cocaine-induced mood disorder (HCC) 10/11/2015  . Cocaine use disorder, severe, dependence (HCC) 08/15/2015  . Major depressive disorder, recurrent severe without psychotic features (HCC) 09/03/2013  . Thrombosis/embolism, arterial (HCC) 07/22/2011  . Pain, wrist, right 07/22/2011  . History of pulmonary embolism 07/22/2011  . BPH (benign prostatic hyperplasia) 07/22/2011  . Obstructive uropathy 07/22/2011  . HTN (hypertension) 07/22/2011  . History of DVT of lower extremity 07/22/2011  . Former cigar smoker 07/22/2011  . 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 Noncompliance w/medication treatment  due to intermit use of medication   . Pulmonary embolism, bilateral (HCC) 01/12/2011  . DVT of lower limb, acute (HCC) 01/12/2011  . Sinusitis acute 01/12/2011    Past Surgical History:  Procedure Laterality Date  . BILATERAL UPPER EXTREMITY ANGIOGRAM N/A 07/23/2011   Procedure: BILATERAL UPPER EXTREMITY ANGIOGRAM;  Surgeon: Fransisco HertzBrian L Chen, MD;  [facility];  Service: Cardiovascular;  Laterality: N/A;  . TONSILLECTOMY          Home Medications    Prior to Admission medications   Medication Sig Start Date End Date Taking? Authorizing Provider  DULoxetine (CYMBALTA) 30 MG capsule Take 1 capsule (30 mg total) by mouth daily. 08/19/15   Oneta Rackanika N Lewis, NP  fluticasone (FLONASE) 50 MCG/ACT nasal spray Place 2 sprays into both nostrils daily as needed for rhinitis.    Historical Provider, MD  glipiZIDE (GLUCOTROL) 5 MG tablet Take 5 mg by mouth 2 (two) times daily before a meal.    Historical Provider, MD  hydrOXYzine (ATARAX/VISTARIL) 25 MG tablet Take 1 tablet (25 mg total) by mouth every 6 (six) hours as needed for anxiety. 08/19/15   Oneta Rackanika N Lewis, NP  insulin glargine (LANTUS) 100 UNIT/ML injection Inject 0.35 mLs (35 Units total) into the skin at bedtime. 08/19/15   Oneta Rackanika N Lewis, NP  lisinopril (PRINIVIL,ZESTRIL) 20 MG tablet Take 20 mg by mouth daily.    Historical Provider, MD  metFORMIN (GLUCOPHAGE-XR) 750 MG 24 hr tablet Take 750 mg by mouth daily with breakfast.    Historical Provider, MD  Multiple Vitamins-Minerals (MULTIVITAMIN WITH MINERALS) tablet Take 1 tablet by mouth daily. 09/08/13   Kizzie FantasiaEvanna Burkett, NP  pravastatin (PRAVACHOL) 20 MG tablet Take 20 mg by mouth at bedtime.     Historical Provider, MD  QUEtiapine (SEROQUEL) 50 MG tablet Take 1 tablet (50 mg total) by mouth at bedtime. 08/19/15   Oneta Rackanika N Lewis, NP  rivaroxaban (XARELTO) 20 MG TABS tablet Take 1 tablet (20 mg total) by mouth daily with supper. 09/08/13   Kizzie FantasiaEvanna Burkett, NP  tamsulosin (FLOMAX) 0.4 MG CAPS capsule Take 1 capsule (0.4 mg total) by mouth at bedtime. 09/08/13   Kizzie FantasiaEvanna Burkett, NP    Family History Family History  Problem Relation Age of Onset  . Depression Mother   . Diabetes Mother   . Depression Brother   . Drug abuse Brother     Social History Social History  Substance Use Topics  . Smoking status: Former Smoker    Quit date: 07/18/2013  . Smokeless tobacco: Never Used  . Alcohol use Yes     Comment: beer  every few weeks     Allergies   Review of patient's allergies indicates no known allergies.   Review of Systems Review of Systems  Psychiatric/Behavioral: Positive for behavioral problems and suicidal ideas.  All other systems reviewed and are negative.    Physical Exam Updated Vital Signs BP (!) 171/103 (BP Location: Right Arm)   Pulse 70   Temp 98.7 F (37.1 C) (Oral)   Resp 18   SpO2 96%   Physical Exam  Constitutional: He is oriented to person, place, and time. He appears well-developed and well-nourished.  HENT:  Head: Normocephalic and atraumatic.  Mouth/Throat: Oropharynx is clear and moist.  Eyes: Conjunctivae and EOM are normal. Pupils are equal, round, and reactive to light.  Neck: Normal range of motion.  Cardiovascular: Normal rate, regular rhythm and normal heart sounds.   Pulmonary/Chest: Effort normal and breath sounds  normal.  Abdominal: Soft. Bowel sounds are normal.  Musculoskeletal: Normal range of motion.  Left knee overall normal in appearance without bony deformity or swelling, no overlying skin changes, full flexion/extension, leg is NVI No calf swelling or tenderness  Neurological: He is alert and oriented to person, place, and time.  Skin: Skin is warm and dry.  Psychiatric: He exhibits a depressed mood. He expresses suicidal ideation. He expresses no homicidal ideation. He expresses no homicidal plans.  Calm, cooperative here; no witnessed violent outburts Appears depressed, tearful during exam Expresses SI without plan; denies HI  Nursing note and vitals reviewed.    ED Treatments / Results  Labs (all labs ordered are listed, but only abnormal results are displayed) Labs Reviewed  COMPREHENSIVE METABOLIC PANEL - Abnormal; Notable for the following:       Result Value   CO2 19 (*)    Glucose, Bld 227 (*)    Creatinine, Ser 1.51 (*)    GFR calc non Af Amer 52 (*)    All other components within normal limits  ETHANOL - Abnormal;  Notable for the following:    Alcohol, Ethyl (B) 25 (*)    All other components within normal limits  URINE RAPID DRUG SCREEN, HOSP PERFORMED - Abnormal; Notable for the following:    Cocaine POSITIVE (*)    All other components within normal limits  CBC    EKG  EKG Interpretation None       Radiology Dg Knee Complete 4 Views Left  Result Date: 10/22/2015 CLINICAL DATA:  Anterior and medial left knee pain after stepping on a curb this afternoon. EXAM: LEFT KNEE - COMPLETE 4+ VIEW COMPARISON:  None. FINDINGS: Moderate medial and lateral tibial spine spur formation. Minimal medial, lateral and patellofemoral spur formation. Moderate anterior patellar spur formation. No fracture, dislocation or effusion seen. IMPRESSION: No fracture.  Mild degenerative changes. Electronically Signed   By: Beckie Salts M.D.   On: 10/22/2015 17:26    Procedures Procedures (including critical care time)  Medications Ordered in ED Medications - No data to display   Initial Impression / Assessment and Plan / ED Course  I have reviewed the triage vital signs and the nursing notes.  Pertinent labs & imaging results that were available during my care of the patient were reviewed by me and considered in my medical decision making (see chart for details).  Clinical Course   50 y.o. M here for medical clearance.  Appears to have worsening depression/PTSD over the past few months.  Now with aggressive outbursts and feeling of "multiple personalities".  Recent admission to [facility] hospital but reports symptoms are worsening.  Here he appears calm and cooperative here, no aggressive outbursts witnessed.  Denies SI but states he would kill "J" if he could.  Wife concerned for his safety.  Screening labs reassuring-- ethanol 25.  Knee pain appears chronic.  X-ray negative for fracture. No clinical signs/symptoms of septic joint, DVT, or cellulitis at this time.  Patient medically cleared, TTS evaluation pending.  Patient  placed on CIWA protocol, home meds ordered.  12:04 AM TTS attempted to evaluated patient, however he began falling asleep during exam.  Will attempt to reassess in the morning.  Final Clinical Impressions(s) / ED Diagnoses   Final diagnoses:  Left knee pain  Behavior concern    New Prescriptions New Prescriptions   No medications on file     Garlon Hatchet, PA-C 10/23/15 0017    Gwyneth Sprout, MD  10/23/15 2045  

## 2015-10-22 NOTE — ED Notes (Signed)
TTS Machine at bedside 

## 2015-10-22 NOTE — ED Notes (Signed)
Pt wife [name] phone number [phone] (mobile).

## 2015-10-22 NOTE — ED Triage Notes (Signed)
Twisted left knee today when miss-stepped on a curb. Previous injury to the same knee.

## 2015-10-22 NOTE — ED Triage Notes (Signed)
Patient started talking to his "other self" [name]. States he is in a state of confusion and has been walking around for days. Feels like his PTSD is overwhelming him. Denies a plan to hurt himself, but states he needs help with the anxiety that the "other self" [name] gives him.

## 2015-10-23 NOTE — ED Notes (Signed)
Patient expressed that he was upset and dissatisfied with being discharged instead of admitted to a psychiatric hospital.   I explained that his mental health symptoms are exacerbated by his substance abuse, and he agreed but denied that he has a problem with substance abuse.  Patient agreeable to contact facilities listed on substance abuse referral.

## 2015-10-23 NOTE — ED Notes (Signed)
Patient's wife Morrie Sheldonshley notified that patient has been discharged.  Wife to pick up patient.

## 2015-10-23 NOTE — BH Assessment (Addendum)
Tele Assessment Note   [name] is a 50 y.o. male who presented to [facility] due to a twisted knee. Upon admission, pt started to talk to an alternate personality of his called "J" and expressed feelings of confusion. Pt presented during assessment as generally mentally clear, but did have two occasions where he spoke to "J", saying "leave me alone, don't you see I'm talking to this lady". "J" also responded back to pt saying "shut up man". During these occasions, writer didn't address the exchange, but continued with the assessment, by asking pt another question. Pt was able to immediately respond to writer's questions clearly, in spite of these occasions.  Pt denied SI and HI. He only reported AVH in the form of his "bad side", "J". Pt admitted to having depression and PTSD. He shared that he relapsed on cocaine @  2 months ago and acknowledged that this was about the same time when he started having issues with "J", whom he stated has been around for awhile, but just started becoming worse. Pt discussed being told by his family that he has been snapping at them and having a bad attitude.  Pt was unable to explain why he had not followed up on outpatient resources given to him 2 weeks ago at [facility] or at the [facility]. Pt stated, "I can't handle being outside..I need to be somewhere longer than a 3 week span of time".   Diagnosis: Cocaine induced depressive disorder, with severe use; Cocaine induced psychotic disorder, with severe use  Past Medical History:  Past Medical History:  Diagnosis Date  . 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 BPH (benign prostatic hyperplasia)   . Current use of long term anticoagulation   . Depression   . Diabetes mellitus without complication (HCC)   . DVT (deep venous thrombosis) (HCC) 01/08/2011   lt leg  . DVT of lower limb, acute (HCC) 01/12/2011  . Enlarged prostate   . History of tobacco use    QUIT  early 2013  . Hypertension   . Noncompliance w/medication treatment due to intermit use of medication     Past Surgical History:  Procedure Laterality Date  . BILATERAL UPPER EXTREMITY ANGIOGRAM N/A 07/23/2011   Procedure: BILATERAL UPPER EXTREMITY ANGIOGRAM;  Surgeon: Fransisco Hertz, MD;  Location: [facility] CATH LAB;  Service: Cardiovascular;  Laterality: N/A;  . TONSILLECTOMY      Family History:  Family History  Problem Relation Age of Onset  . Depression Mother   . Diabetes Mother   . Depression Brother   . Drug abuse Brother     Social History:  reports that he quit smoking about 2 years ago. He has never used smokeless tobacco. He reports that he drinks alcohol. He reports that he uses drugs, including Cocaine and Marijuana, about 3 times per week.  Additional Social History:  Alcohol / Drug Use Pain Medications: none noted Prescriptions: pt mentioned cymbalta-not clear if he is med compliant Over the Counter: none noted History of alcohol / drug use?: Yes Longest period of sobriety (when/how long): 2 years Substance #1 Name of Substance 1: cocaine 1 - Age of First Use: 24 1 - Frequency: varies 1 - Duration: ongoing x 2 months 1 - Last Use / Amount: 2 days ago  CIWA: CIWA-Ar BP: 122/80 Pulse Rate: 77 Nausea and Vomiting: no nausea and no vomiting Tactile Disturbances: none Tremor: no tremor Auditory Disturbances: not present Paroxysmal  Sweats: no sweat visible Visual Disturbances: not present Anxiety: no anxiety, at ease Headache, Fullness in Head: none present Agitation: normal activity Orientation and Clouding of Sensorium: oriented and can do serial additions CIWA-Ar Total: 0 COWS:    PATIENT STRENGTHS: (choose at least two) Average or above average intelligence Motivation for treatment/growth Supportive family/friends  Allergies: No Known Allergies  Home Medications:  (Not in a hospital admission)  OB/GYN Status:  No LMP for male patient.  General  Assessment Data [facility] Assessment: In system Is this a Tele or Face-to-Face Assessment?: Tele Assessment Is this an Initial Assessment or a Re-assessment for this encounter?: Initial Assessment Marital status: Married Living Arrangements: Spouse/significant other, Children Can pt return to current living arrangement?: Yes Admission Status: Voluntary Is patient capable of signing voluntary admission?: Yes Referral Source: Self/Family/Friend Insurance type: [facility]     Crisis Care Plan Living Arrangements: Spouse/significant other, Children [facility] of Psychiatrist: [facility] (not clear if pt is compliant) [facility]not [facility] of [facility])  Education Status Is patient currently in school?: No  Risk to self with the past 6 months Suicidal Ideation: No Has patient been a risk to self within the past 6 months prior to admission? : No Suicidal Intent: No Has patient had any suicidal intent within the past 6 months prior to admission? : No Is patient at risk for suicide?: No Suicidal Plan?: No Has patient had any suicidal plan within the past 6 months prior to admission? : No Access to Means: No What has been your use of drugs/alcohol within the last 12 months?: see above Previous Attempts/Gestures: Yes How many times?: 1 Triggers for Past Attempts: Unknown Intentional Self Injurious Behavior: None Family Suicide History: Yes Recent stressful life event(s): Other (Comment) (cocaine relapse; good side/bad side of personality) Persecutory voices/beliefs?: No Depression: Yes Depression Symptoms: Feeling angry/irritable, Feeling worthless/self pity, Tearfulness Substance abuse history and/or treatment for substance abuse?: Yes Suicide prevention information given to non-admitted patients: Not applicable  Risk to Others within the past 6 months Homicidal Ideation: No Does patient have any lifetime risk of violence toward others beyond the six months  prior to admission? : No Thoughts of Harm to Others: No Current Homicidal Intent: No Current Homicidal Plan: No Access to Homicidal Means: No History of harm to others?: No Assessment of Violence: None Noted Does patient have access to weapons?: No Criminal Charges Pending?: No Does patient have a court date: No Is patient on probation?: No  Psychosis Hallucinations: Auditory, Visual Delusions: None noted  Mental Status Report Appearance/Hygiene: Unremarkable Eye Contact: Poor Motor Activity: Unremarkable Speech: Logical/coherent Level of Consciousness: Quiet/awake Mood: Sad Affect: Sad Anxiety Level: Minimal Thought Processes: Coherent, Relevant Judgement: Partial Orientation: Person, Place, Situation, Time Obsessive Compulsive Thoughts/Behaviors: None  Cognitive Functioning Concentration: Fair Memory: Unable to Assess IQ: Average Insight: Fair Impulse Control: Unable to Assess Appetite: Fair Sleep: Decreased Vegetative Symptoms: None  ADLScreening [facility] Assessment Services) Patient's cognitive ability adequate to safely complete daily activities?: Yes Patient able to express need for assistance with ADLs?: Yes Independently performs ADLs?: Yes (appropriate for developmental age)  Prior Inpatient Therapy Prior Inpatient Therapy: Yes Prior Therapy Dates: 2011; 2013; 2015; 2017  [facility] Reason for Treatment: cocaine abuse; MDD  Prior Outpatient Therapy Prior Outpatient Therapy: Yes Does patient have an ACCT team?: No Does patient have Intensive In-House Services?  : No Does patient have [facility] services? : No Does patient have P4CC services?: No  ADL Screening (condition at time of admission) Patient's cognitive ability adequate to safely complete daily activities?: Yes Is the patient deaf or have difficulty hearing?: No Does the patient have difficulty seeing, even when wearing glasses/contacts?: No Does the  patient have difficulty concentrating, remembering, or making decisions?: No Patient able to express need for assistance with ADLs?: Yes Does the patient have difficulty dressing or bathing?: No Independently performs ADLs?: Yes (appropriate for developmental age) Does the patient have difficulty walking or climbing stairs?: No Weakness of Legs: None Weakness of Arms/Hands: None  Home Assistive Devices/Equipment Home Assistive Devices/Equipment: None  Therapy Consults (therapy consults require a physician order) PT Evaluation Needed: No OT Evalulation Needed: No SLP Evaluation Needed: No Abuse/Neglect Assessment (Assessment to be complete while patient is alone) Physical Abuse: Denies Verbal Abuse: Denies Sexual Abuse: Denies Exploitation of patient/patient's resources: Denies Self-Neglect: Denies Values / Beliefs Cultural Requests During Hospitalization: None Spiritual Requests During Hospitalization: None Consults Spiritual Care Consult Needed: No Social Work Consult Needed: No Merchant navy officerAdvance Directives (For Healthcare) Does patient have an advance directive?: No Would patient like information on creating an advanced directive?: No - patient declined information    Additional Information 1:1 In Past 12 Months?: No CIRT Risk: No Elopement Risk: No Does patient have medical clearance?: Yes     Disposition:  Disposition Initial Assessment Completed for this Encounter: Yes (consulted with Fransisca KaufmannLaura Davis, NP) Disposition of Patient: Other dispositions (f/u with OP resources given; look into rehab)  Laddie AquasSamantha M Staley Budzinski 10/23/2015 11:10 AM

## 2015-10-23 NOTE — [facility] Counselor (Signed)
[facility] Assessment Progress Note  After consult with Fransisca KaufmannLaura Davis, NP, it was determined that pt did not meet IP criteria and would most benefit from substance abuse treatment-preferably residential. Resources faxed to pt's RN, Chestine Sporelark, to [phone]. Disposition also shared with EDP [place].  Johny ShockSamantha M. Ladona Ridgelaylor, MS, NCC, LPCA Counselor

## 2015-10-23 NOTE — [facility] Counselor (Signed)
Clinician spoke to HuntingtonMisty, RN to resend TTS Consult in the AM due to pt sleeping and not engaging in the assessment. Misty reported she will inform pt's RN Windell Moulding(Ruth) of the disposition.    Gwinda Passereylese D Bennett, MS, [facility], [facility] Triage Specialist [phone]

## 2015-10-23 NOTE — ED Notes (Signed)
Called patient's wife regarding pick up of the patient.  Wife states "I am at the stoplight outside."  Patient informed of wife's status.  Patient given diet ginger ale while he waits.

## 2015-10-23 NOTE — ED Notes (Signed)
Patient's wife called regarding patient pick up.  Call forwarded to voicemail after one ring.

## 2015-10-23 NOTE — ED Notes (Signed)
Patient was given a snack and drink. A regular lunch order was taken. 

## 2015-10-23 NOTE — Discharge Instructions (Signed)
Please use the references provided to help you.

## 2016-02-14 ENCOUNTER — Emergency Department (HOSPITAL_COMMUNITY)
Admission: EM | Admit: 2016-02-14 | Discharge: 2016-02-14 | Disposition: A | Discharge status: Home / Self Care | Payer: Non-veteran care | Attending: Emergency Medicine | Admitting: Emergency Medicine

## 2016-02-14 ENCOUNTER — Encounter (HOSPITAL_COMMUNITY): Payer: Self-pay | Admitting: Emergency Medicine

## 2016-02-14 DIAGNOSIS — Z87891 Personal history of nicotine dependence: Secondary | ICD-10-CM | POA: Diagnosis not present

## 2016-02-14 DIAGNOSIS — R55 Syncope and collapse: Secondary | ICD-10-CM | POA: Diagnosis not present

## 2016-02-14 DIAGNOSIS — R079 Chest pain, unspecified: Secondary | ICD-10-CM | POA: Insufficient documentation

## 2016-02-14 DIAGNOSIS — I10 Essential (primary) hypertension: Secondary | ICD-10-CM | POA: Insufficient documentation

## 2016-02-14 DIAGNOSIS — Z794 Long term (current) use of insulin: Secondary | ICD-10-CM | POA: Diagnosis not present

## 2016-02-14 DIAGNOSIS — N289 Disorder of kidney and ureter, unspecified: Secondary | ICD-10-CM | POA: Insufficient documentation

## 2016-02-14 DIAGNOSIS — E119 Type 2 diabetes mellitus without complications: Secondary | ICD-10-CM | POA: Diagnosis not present

## 2016-02-14 DIAGNOSIS — Z7901 Long term (current) use of anticoagulants: Secondary | ICD-10-CM | POA: Diagnosis not present

## 2016-02-14 LAB — COMPREHENSIVE METABOLIC PANEL
ALK PHOS: 66 U/L (ref 38–126)
ALT: 31 U/L (ref 17–63)
AST: 29 U/L (ref 15–41)
Albumin: 3.6 g/dL (ref 3.5–5.0)
Anion gap: 9 (ref 5–15)
BILIRUBIN TOTAL: 0.6 mg/dL (ref 0.3–1.2)
BUN: 18 mg/dL (ref 6–20)
CALCIUM: 8.6 mg/dL — AB (ref 8.9–10.3)
CO2: 22 mmol/L (ref 22–32)
CREATININE: 1.75 mg/dL — AB (ref 0.61–1.24)
Chloride: 104 mmol/L (ref 101–111)
GFR calc non Af Amer: 43 mL/min — ABNORMAL LOW (ref >60)
GFR, EST AFRICAN AMERICAN: 50 mL/min — AB (ref >60)
Glucose, Bld: 265 mg/dL — ABNORMAL HIGH (ref 65–99)
Potassium: 3.9 mmol/L (ref 3.5–5.1)
SODIUM: 135 mmol/L (ref 135–145)
TOTAL PROTEIN: 6.1 g/dL — AB (ref 6.5–8.1)

## 2016-02-14 LAB — CBC WITH DIFFERENTIAL/PLATELET
BASOS ABS: 0 10*3/uL (ref 0.0–0.1)
BASOS PCT: 0 %
Eosinophils Absolute: 0.1 10*3/uL (ref 0.0–0.7)
Eosinophils Relative: 1 %
HEMATOCRIT: 39.1 % (ref 39.0–52.0)
HEMOGLOBIN: 13.6 g/dL (ref 13.0–17.0)
LYMPHS PCT: 34 %
Lymphs Abs: 2.1 10*3/uL (ref 0.7–4.0)
MCH: 32.1 pg (ref 26.0–34.0)
MCHC: 34.8 g/dL (ref 30.0–36.0)
MCV: 92.2 fL (ref 78.0–100.0)
MONO ABS: 0.6 10*3/uL (ref 0.1–1.0)
MONOS PCT: 10 %
NEUTROS ABS: 3.3 10*3/uL (ref 1.7–7.7)
NEUTROS PCT: 55 %
Platelets: 230 10*3/uL (ref 150–400)
RBC: 4.24 MIL/uL (ref 4.22–5.81)
RDW: 12.8 % (ref 11.5–15.5)
WBC: 6.1 10*3/uL (ref 4.0–10.5)

## 2016-02-14 LAB — TROPONIN I: Troponin I: <0.03 ng/mL (ref <0.03)

## 2016-02-14 LAB — CBG MONITORING, ED: Glucose-Capillary: 267 mg/dL — ABNORMAL HIGH (ref 65–99)

## 2016-02-14 MED ORDER — ASPIRIN 81 MG PO CHEW
324.0000 mg | CHEWABLE_TABLET | Freq: Once | ORAL | Status: AC
  Administered 2016-02-14: 324 mg via ORAL
  Filled 2016-02-14: qty 4

## 2016-02-14 MED ORDER — ACETAMINOPHEN 325 MG PO TABS
650.0000 mg | ORAL_TABLET | Freq: Once | ORAL | Status: AC
  Administered 2016-02-14: 650 mg via ORAL
  Filled 2016-02-14: qty 2

## 2016-02-14 NOTE — ED Provider Notes (Signed)
[facility] Provider Note   CSN: [id number] Arrival date & time: 02/14/16  0108  By signing my name below, I, Emmanuella Mensah, attest that this documentation has been prepared under the direction and in the presence of Dione Boozeavid Abiha Lukehart, MD. Electronically Signed: Angelene GiovanniEmmanuella Mensah, ED Scribe. 02/14/16. 1:38 AM.   History   Chief Complaint Chief Complaint  Patient presents with  . Near Syncope    HPI Comments: [name] is a 50 y.o. male with a hx of DM, PE, DVT of lower limb, and hypertension brought in by ambulance, who presents to the Emergency Department for evaluation s/p one near syncopal episode that occurred at 10:30 pm yesterday. He explains that he began experiencing sudden onset of dizziness, chest tightness, shortness of breath, diaphoresis, nausea, and fatigue he describes as "no energy" while ambulating from his car to work at 10:30 pm. He adds that since he works 3rd shift, he had been sleeping all day so he knew he could not attribute his symptoms to lack of sleep nor was he tired. He denies that he has been sick recently, stating that he was feeling fine prior to his episode and has been eating/drinking appropriately. He reports that although most of his symptoms have resolved, he currently has a 3/10 chest tightness and mild shortness of breath. No alleviating factors noted. Pt has not tried any medications PTA. He received Zofran en route by EMS with relief of his nausea. He states that he had these symptoms one year ago and was told it could be due to his increase blood sugar at that time (>300's). Pt is on Metformin and Insulin for his DM. He reports a family hx of heart issues with his mother who died from an MI while she was 63 years ago. Pt has NKDA. He denies any fever, vomiting, or any other symptoms.   The history is provided by the patient. No language interpreter was used.  Near Syncope  This is a new problem. The current episode started 3 to 5 hours ago. The  problem has been rapidly improving. Associated symptoms include shortness of breath. Nothing aggravates the symptoms. Nothing relieves the symptoms. He has tried nothing for the symptoms.    Past Medical History:  Diagnosis Date  . 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 BPH (benign prostatic hyperplasia)   . Current use of long term anticoagulation   . Depression   . Diabetes mellitus without complication (HCC)   . DVT (deep venous thrombosis) (HCC) 01/08/2011   lt leg  . DVT of lower limb, acute (HCC) 01/12/2011  . Enlarged prostate   . History of tobacco use    QUIT early 2013  . Hypertension   . Noncompliance w/medication treatment due to intermit use of medication     Patient Active Problem List   Diagnosis Date Noted  . Cocaine abuse with cocaine-induced mood disorder (HCC) 10/11/2015  . Cocaine use disorder, severe, dependence (HCC) 08/15/2015  . Major depressive disorder, recurrent severe without psychotic features (HCC) 09/03/2013  . Thrombosis/embolism, arterial (HCC) 07/22/2011  . Pain, wrist, right 07/22/2011  . History of pulmonary embolism 07/22/2011  . BPH (benign prostatic hyperplasia) 07/22/2011  . Obstructive uropathy 07/22/2011  . HTN (hypertension) 07/22/2011  . History of DVT of lower extremity 07/22/2011  . Former cigar smoker 07/22/2011  . 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 Noncompliance w/medication treatment due to intermit use of medication   . Pulmonary  embolism, bilateral (HCC) 01/12/2011  . DVT of lower limb, acute (HCC) 01/12/2011  . Sinusitis acute 01/12/2011    Past Surgical History:  Procedure Laterality Date  . BILATERAL UPPER EXTREMITY ANGIOGRAM N/A 07/23/2011   Procedure: BILATERAL UPPER EXTREMITY ANGIOGRAM;  Surgeon: Fransisco HertzBrian L Chen, MD;  [facility];  Service: Cardiovascular;  Laterality: N/A;  . TONSILLECTOMY         Home Medications    Prior to Admission  medications   Medication Sig Start Date End Date Taking? Authorizing Provider  acetaminophen (TYLENOL) 650 MG CR tablet Take 1,300 mg by mouth every 8 (eight) hours as needed for pain.    Historical Provider, MD  capsaicin (ZOSTRIX) 0.025 % cream Apply 1 application topically 2 (two) times daily as needed (muscle pain).     Historical Provider, MD  Cyanocobalamin (VITAMIN B-12 PO) Take 1 tablet by mouth daily.    Historical Provider, MD  DULoxetine (CYMBALTA) 30 MG capsule Take 1 capsule (30 mg total) by mouth daily. Patient not taking: Reported on 10/22/2015 08/19/15   Oneta Rackanika N Lewis, NP  DULoxetine (CYMBALTA) 60 MG capsule Take 60 mg by mouth daily.    Historical Provider, MD  finasteride (PROSCAR) 5 MG tablet Take 5 mg by mouth daily.    Historical Provider, MD  glipiZIDE (GLUCOTROL) 5 MG tablet Take 5 mg by mouth 2 (two) times daily before a meal.    Historical Provider, MD  hydrOXYzine (ATARAX/VISTARIL) 25 MG tablet Take 1 tablet (25 mg total) by mouth every 6 (six) hours as needed for anxiety. Patient not taking: Reported on 10/22/2015 08/19/15   Oneta Rackanika N Lewis, NP  insulin glargine (LANTUS) 100 UNIT/ML injection Inject 0.35 mLs (35 Units total) into the skin at bedtime. Patient taking differently: Inject 35 Units into the skin daily before breakfast.  08/19/15   Oneta Rackanika N Lewis, NP  lisinopril (PRINIVIL,ZESTRIL) 10 MG tablet Take 10 mg by mouth daily.    Historical Provider, MD  metFORMIN (GLUCOPHAGE-XR) 750 MG 24 hr tablet Take 1,500 mg by mouth daily with breakfast.     Historical Provider, MD  Multiple Vitamins-Minerals (MULTIVITAMIN WITH MINERALS) tablet Take 1 tablet by mouth daily. 09/08/13   Kizzie FantasiaEvanna Burkett, NP  pravastatin (PRAVACHOL) 20 MG tablet Take 20 mg by mouth at bedtime.     Historical Provider, MD  QUEtiapine (SEROQUEL) 50 MG tablet Take 1 tablet (50 mg total) by mouth at bedtime. Patient not taking: Reported on 10/22/2015 08/19/15   Oneta Rackanika N Lewis, NP  risperiDONE (RISPERDAL) 0.5 MG  tablet Take 0.5 mg by mouth at bedtime.    Historical Provider, MD  rivaroxaban (XARELTO) 20 MG TABS tablet Take 1 tablet (20 mg total) by mouth daily with supper. Patient taking differently: Take 20 mg by mouth daily with breakfast.  09/08/13   Kizzie FantasiaEvanna Burkett, NP  tamsulosin (FLOMAX) 0.4 MG CAPS capsule Take 1 capsule (0.4 mg total) by mouth at bedtime. Patient taking differently: Take 0.8 mg by mouth at bedtime.  09/08/13   Kizzie FantasiaEvanna Burkett, NP    Family History Family History  Problem Relation Age of Onset  . Depression Mother   . Diabetes Mother   . Depression Brother   . Drug abuse Brother     Social History Social History  Substance Use Topics  . Smoking status: Former Smoker    Quit date: 07/18/2013  . Smokeless tobacco: Never Used  . Alcohol use Yes     Comment: beer every few weeks  Allergies   Patient has no known allergies.   Review of Systems Review of Systems  Constitutional: Positive for diaphoresis and fatigue. Negative for chills and fever.  Respiratory: Positive for chest tightness and shortness of breath.   Cardiovascular: Positive for near-syncope.  Gastrointestinal: Positive for nausea. Negative for vomiting.  All other systems reviewed and are negative.    Physical Exam Updated Vital Signs SpO2 95%   Physical Exam  Constitutional: He is oriented to person, place, and time. He appears well-developed and well-nourished.  HENT:  Head: Normocephalic and atraumatic.  Eyes: EOM are normal. Pupils are equal, round, and reactive to light.  Neck: Normal range of motion. Neck supple. No JVD present.  Cardiovascular: Normal rate, regular rhythm and normal heart sounds.   No murmur heard. Pulmonary/Chest: Effort normal and breath sounds normal. He has no wheezes. He has no rales. He exhibits no tenderness.  Abdominal: Soft. Bowel sounds are normal. He exhibits no distension and no mass. There is no tenderness.  Musculoskeletal: Normal range of motion. He  exhibits no edema.  Lymphadenopathy:    He has no cervical adenopathy.  Neurological: He is alert and oriented to person, place, and time. No cranial nerve deficit. He exhibits normal muscle tone. Coordination normal.  Skin: Skin is warm and dry. No rash noted.  Psychiatric: He has a normal mood and affect. His behavior is normal. Judgment and thought content normal.  Nursing note and vitals reviewed.    ED Treatments / Results  DIAGNOSTIC STUDIES: Oxygen Saturation is 95% on RA, adequate by my interpretation.    COORDINATION OF CARE: 1:30 AM- Pt advised of plan for treatment and pt agrees. Pt will receive aspirin.   Labs (all labs ordered are listed, but only abnormal results are displayed) Labs Reviewed  COMPREHENSIVE METABOLIC PANEL - Abnormal; Notable for the following:       Result Value   Glucose, Bld 265 (*)    Creatinine, Ser 1.75 (*)    Calcium 8.6 (*)    Total Protein 6.1 (*)    GFR calc non Af Amer 43 (*)    GFR calc Af Amer 50 (*)    All other components within normal limits  CBG MONITORING, ED - Abnormal; Notable for the following:    Glucose-Capillary 267 (*)    All other components within normal limits  CBC WITH DIFFERENTIAL/PLATELET  TROPONIN I    EKG  EKG Interpretation  Date/Time:  Thursday February 14 2016 01:14:20 EST Ventricular Rate:  70 PR Interval:    QRS Duration: 82 QT Interval:  387 QTC Calculation: 418 R Axis:   31 Text Interpretation:  Sinus rhythm Abnormal R-wave progression, early transition Nonspecific T abnormalities, diffuse leads When compared with ECG of 08/15/2015, No significant change was found Confirmed by [facility]  MD, Sarabella Caprio ([id number]) on 02/14/2016 1:20:57 AM        Procedures Procedures (including critical care time)  Medications Ordered in ED Medications  aspirin chewable tablet 324 mg (324 mg Oral Given 02/14/16 0141)  acetaminophen (TYLENOL) tablet 650 mg (650 mg Oral Given 02/14/16 0359)     Initial Impression /  Assessment and Plan / ED Course  Dione Booze, MD has reviewed the triage vital signs and the nursing notes.  Pertinent lab results that were available during my care of the patient were reviewed by me and considered in my medical decision making (see chart for details).  Clinical Course    Near syncopal episode of uncertain cause. Review  old records shows no similar visits. Chest discomfort of uncertain cause. ECG is unremarkable and troponin is normal. Mild elevation in creatinine compared with baseline will need to be followed as an outpatient. Orthostatic vital signs showed no significant changes and he was able to ambulate in the ED without difficulty. He is discharged with instructions to follow-up with his PCP, return to ED should symptoms recur or worsen.  Final Clinical Impressions(s) / ED Diagnoses   Final diagnoses:  Near syncope  Chest pain, unspecified type  Renal insufficiency    New Prescriptions New Prescriptions   No medications on file   I personally performed the services described in this documentation, which was scribed in my presence. The recorded information has been reviewed and is accurate.       Dione Booze, MD 02/14/16 [phone]

## 2016-02-14 NOTE — ED Triage Notes (Signed)
Pt presents to the ED via [facility]. Pt felt weak and "just not right" around 2230 02/13/16. Pt became diaphoretic, chest pain and SOB which resolved before EMS arrived. Pt treated for nausea in transit with zofran. Pt lightheaded, weak and complains of a headache. BP was 104/40 with EMS and treated with 500 mL bolus. Pt told EMS that he had similar symptoms about a year ago when BS was 300. CBG 287 with EMS.Vital signs stable.

## 2016-02-14 NOTE — ED Notes (Signed)
Ambulated Pt in hallway. Pt stated he felt a little dizzy but complained of a headache. Pt stated he felt stable on his feet. Slight unsteady gait noted, but pt walked unassisted.

## 2016-06-20 IMAGING — US US SCROTUM
1 series · 13 of 25 positions shown · non-contrast
Comparison: None.

CLINICAL DATA: Left scrotal pain for 1 day.

EXAM:
SCROTAL ULTRASOUND
DOPPLER ULTRASOUND OF THE TESTICLES
TECHNIQUE: Complete ultrasound examination of the testicles, epididymis, and
other scrotal structures was performed. Color and spectral Doppler
ultrasound were also utilized to evaluate blood flow to the
testicles.

[Series 1: us scrotum · 0.06mm/px · 13 of 45 slices shown]
[im 1/45]
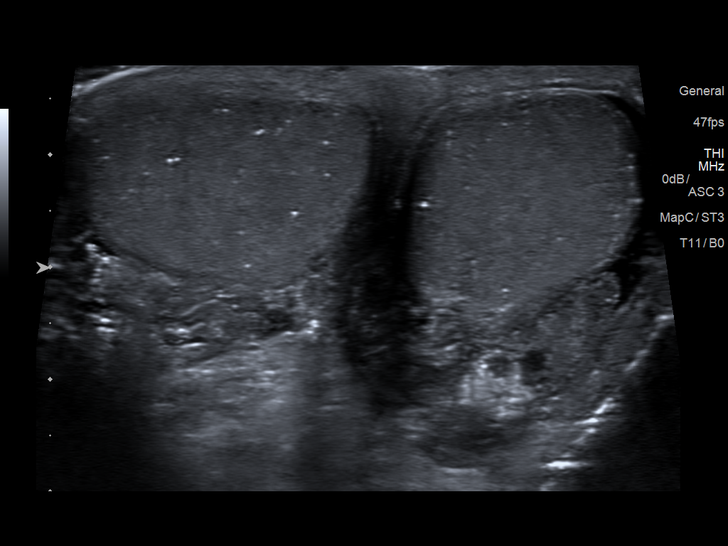
[im 4/45]
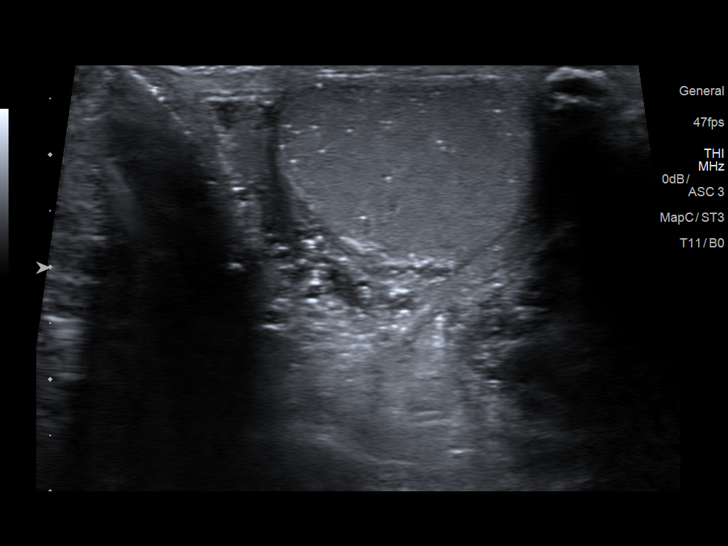
[im 8/45]
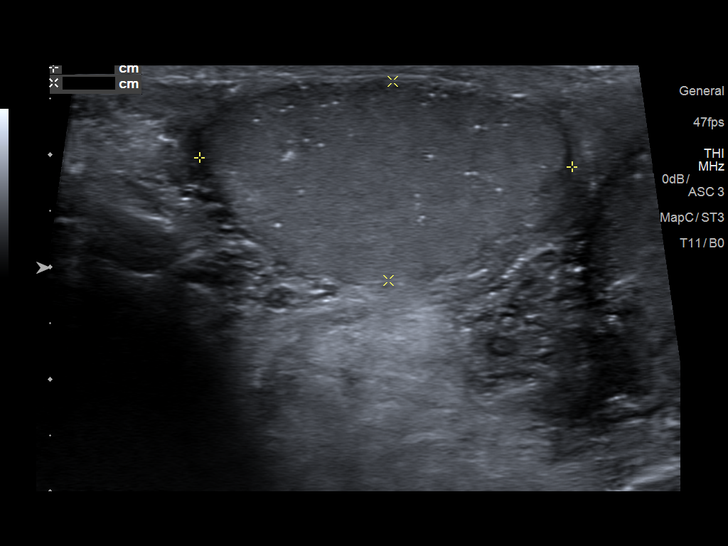
[im 12/45]
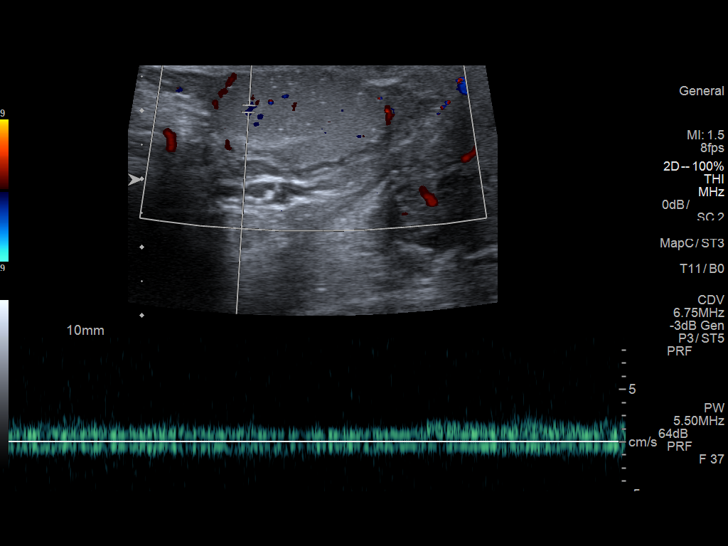
[im 15/45]
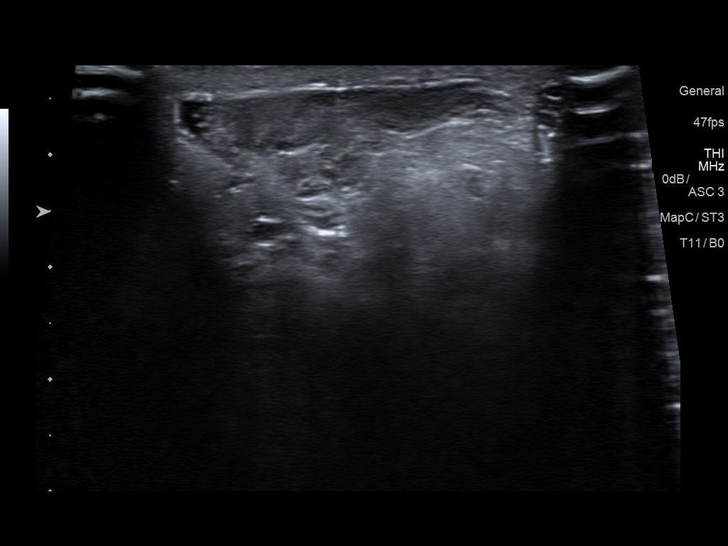
[im 19/45]
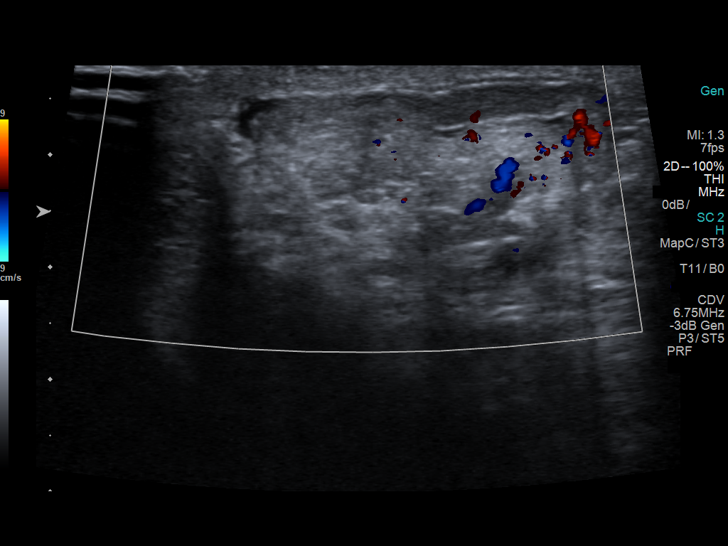
[im 23/45]
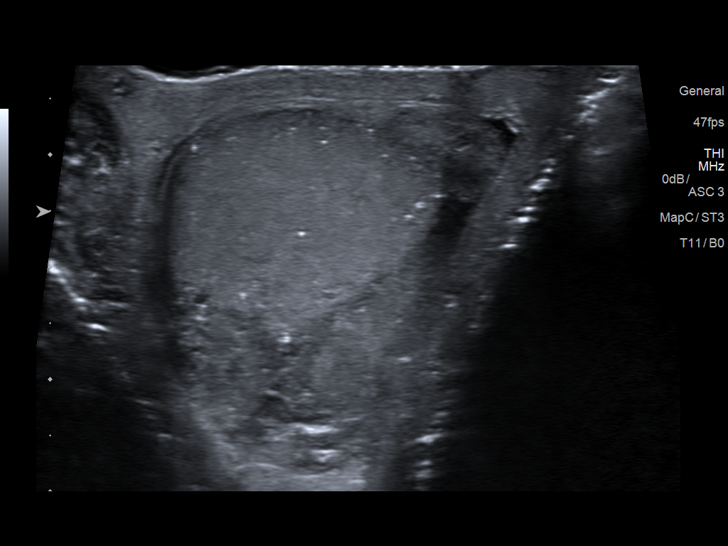
[im 26/45]
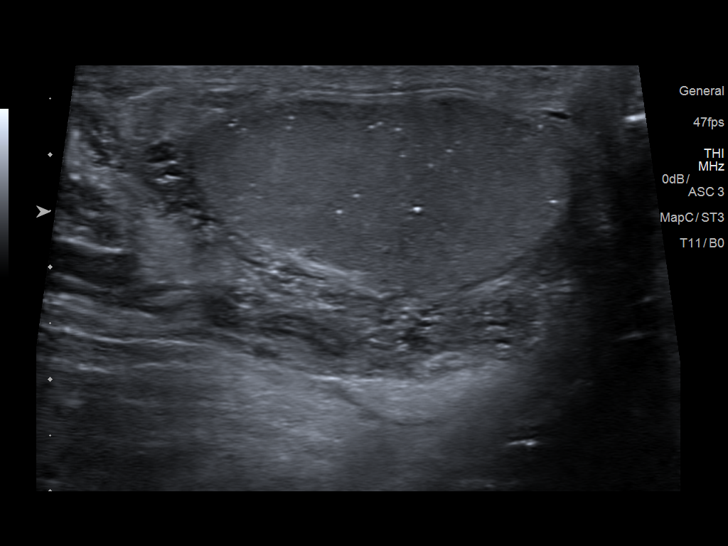
[im 30/45]
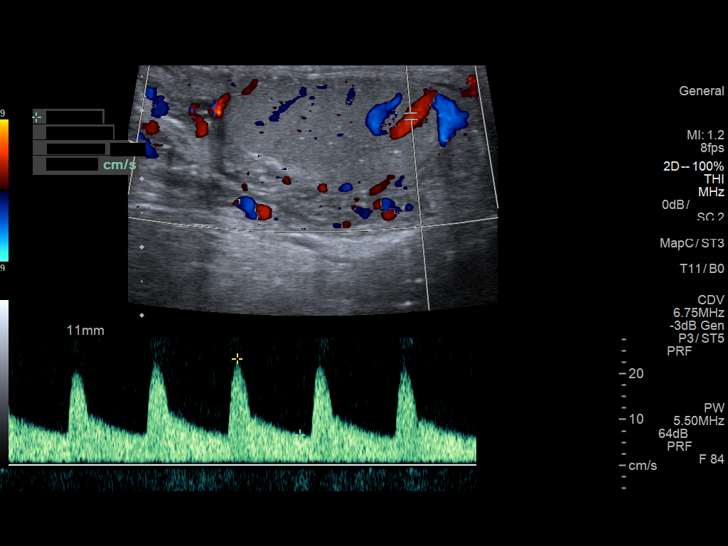
[im 34/45]
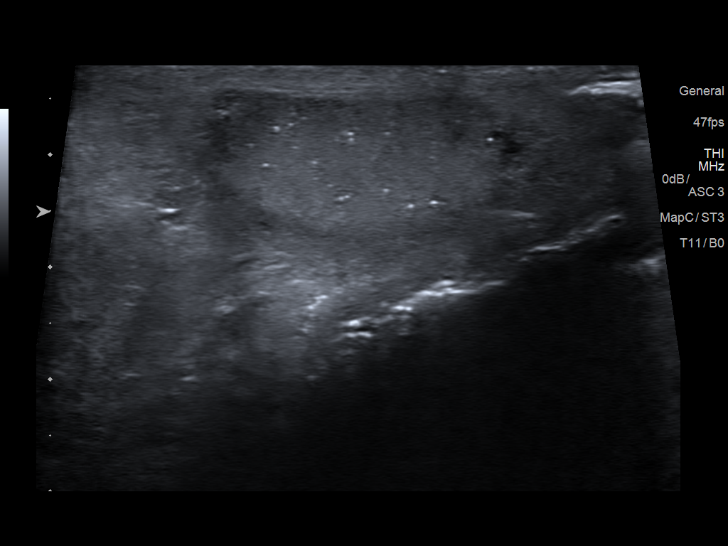
[im 37/45]
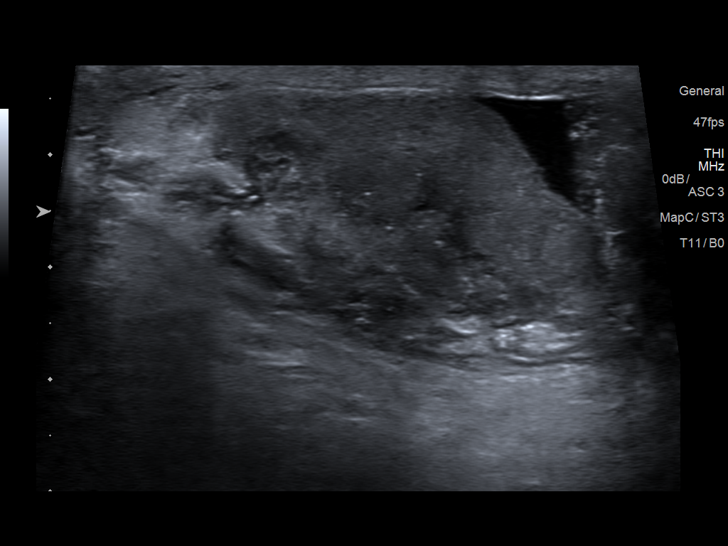
[im 41/45]
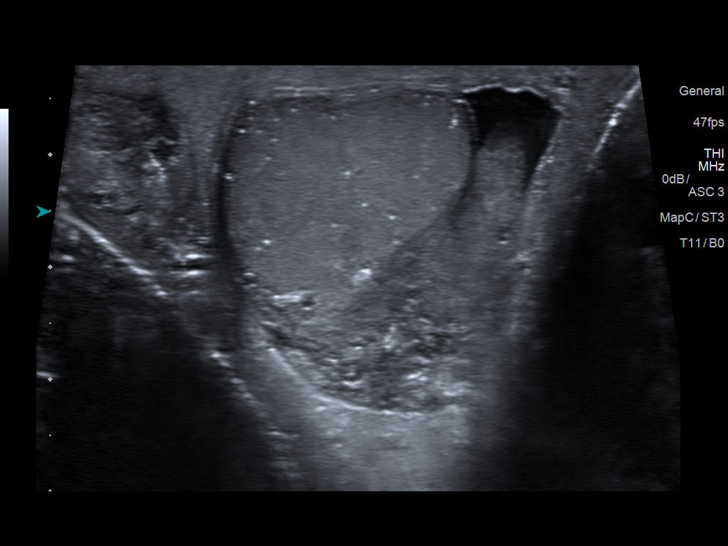
[im 45/45]
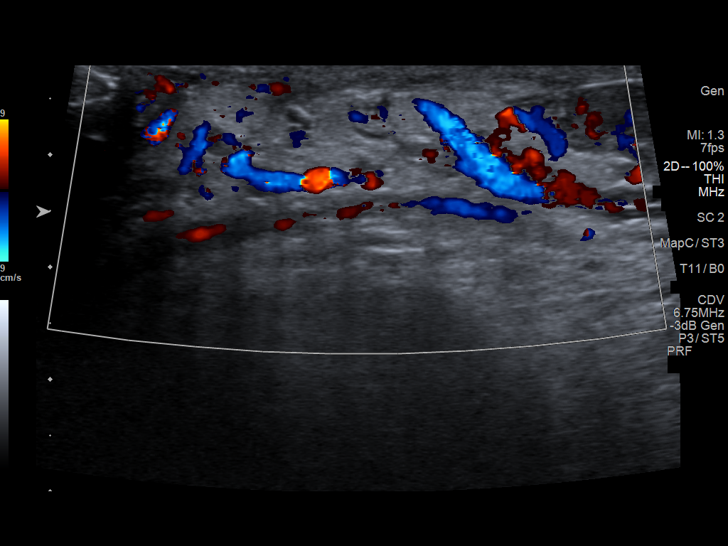

[13 of 25 positions shown; findings below may reference images not displayed]

FINDINGS: Right testicle

Measurements: 3.3 x 1.8 x 2.2 cm. Multiple echogenic foci consistent
microlithiasis. No mass visualized. Normal blood flow.

Left testicle

Measurements: 3.5 x 1.7 x 2.4 cm. Multiple echogenic foci consistent
with microlithiasis. No mass visualized. Normal blood flow.

Right epididymis:  Normal in size and appearance.

Left epididymis:  Enlarged with increased vascularity.

Hydrocele:  Small on the left.

Varicocele:  None visualized.

Pulsed Doppler interrogation of both testes demonstrates normal low
resistance arterial and venous waveforms bilaterally.
IMPRESSION: 1. Left epididymitis with reactive hydrocele.
2. No testicular torsion.  Normal blood flow to both testicles.
3. Bilateral testicular microlithiasis. Current literature suggests
that testicular microlithiasis is not a significant independent risk
factor for development of testicular carcinoma, and that follow up
imaging is not warranted in the absence of other risk factors.
Monthly testicular self-examination and annual physical exams are
considered appropriate surveillance. If patient has other risk
factors for testicular carcinoma, then referral to Urology should be
considered. (Reference: Chai, et al.: A 5-Year Follow up Study
of Asymptomatic Men with Testicular Microlithiasis. J Urol 3777;

## 2017-01-20 IMAGING — DX DG KNEE COMPLETE 4+V*L*
4 series · 4 of 4 positions shown · non-contrast
Comparison: None.

CLINICAL DATA: Anterior and medial left knee pain after stepping on
a curb this afternoon.

EXAM:
LEFT KNEE - COMPLETE 4+ VIEW

[knee ap]
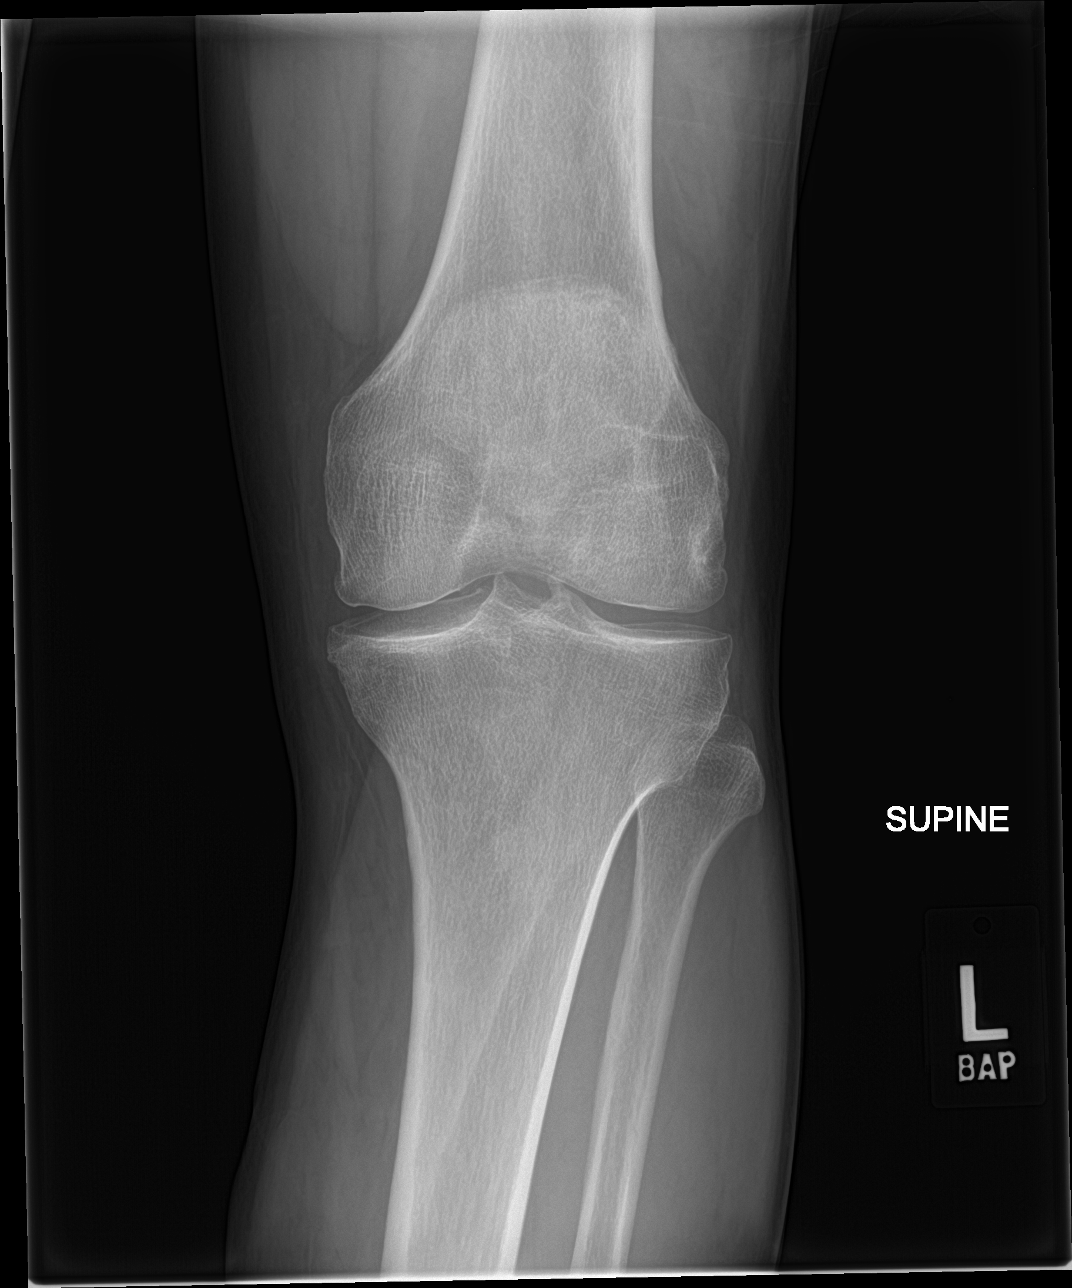

[knee lat]
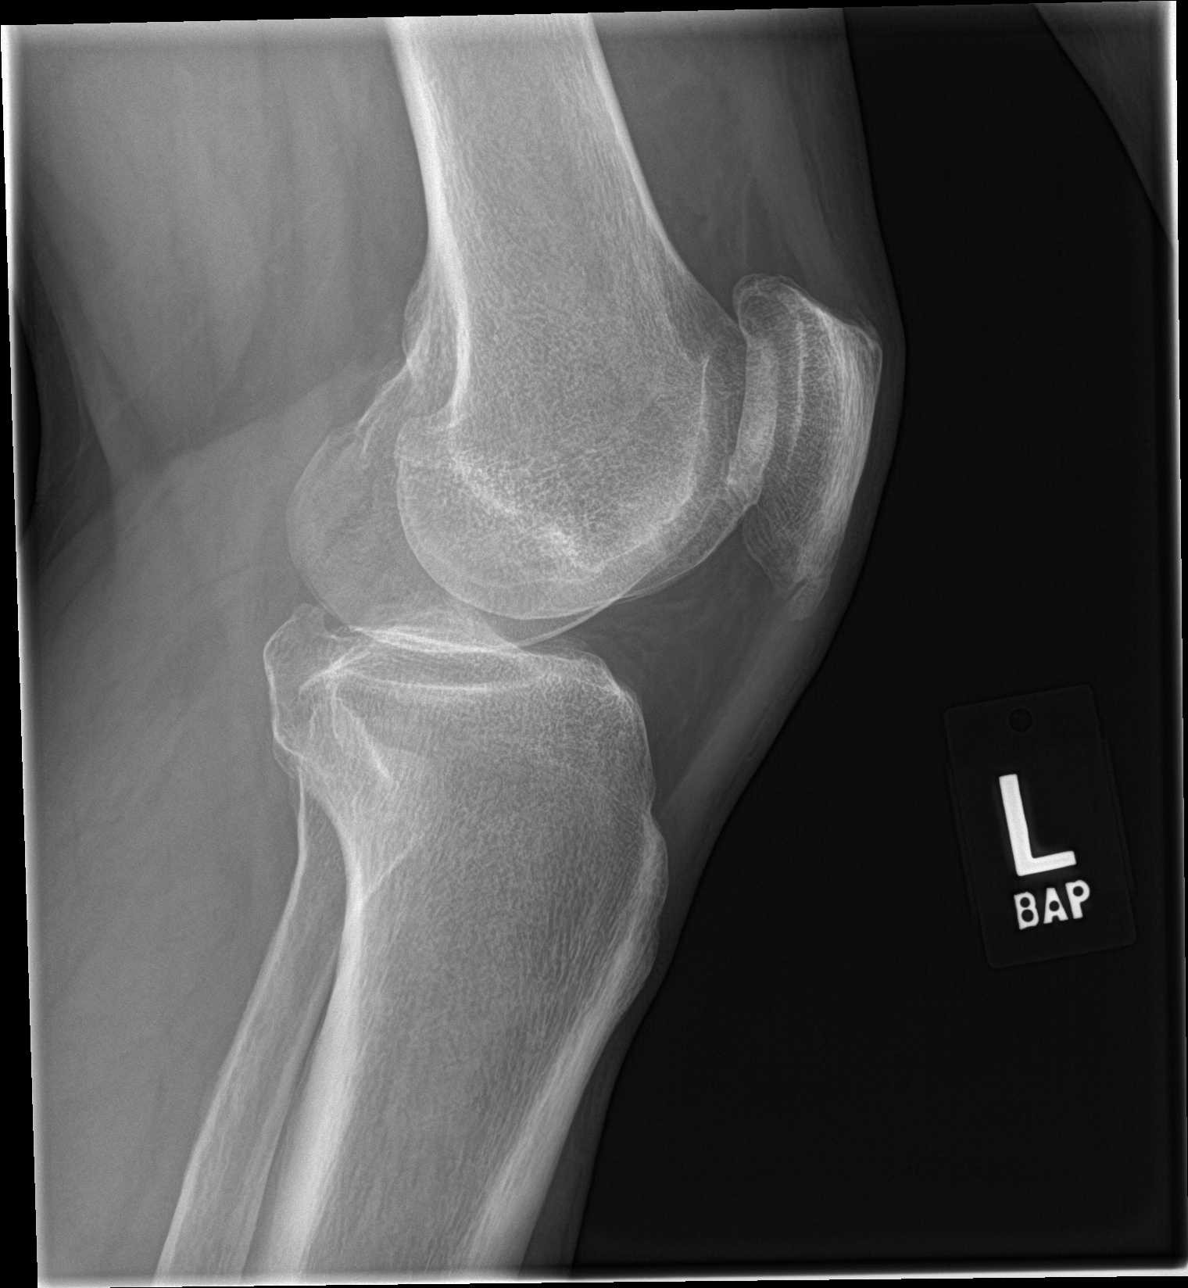

[knee obl (1 of 2)]
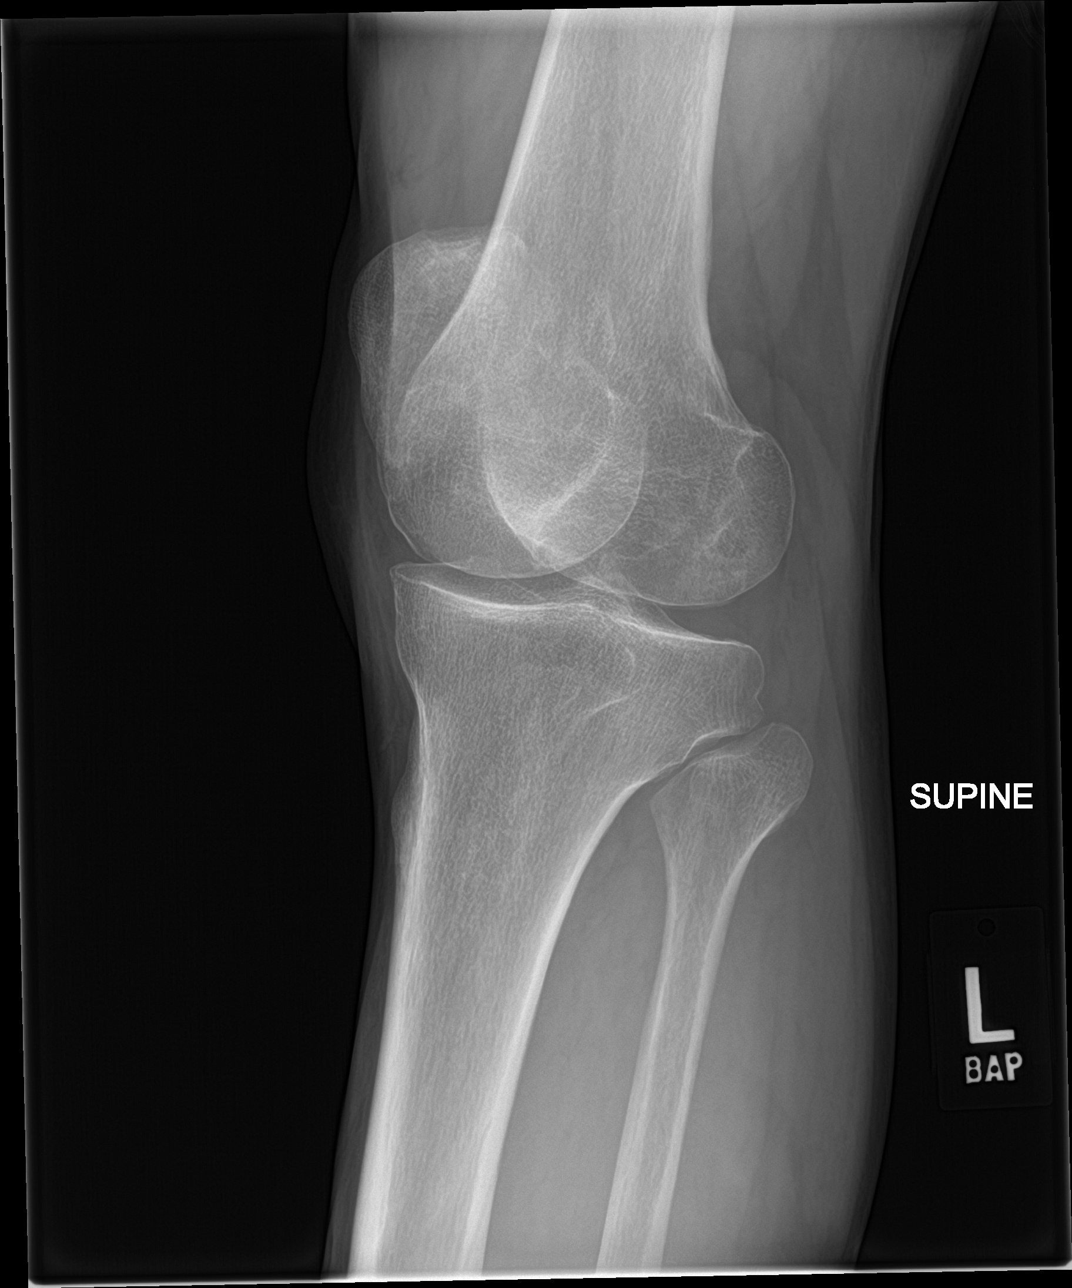

[knee obl (2 of 2)]
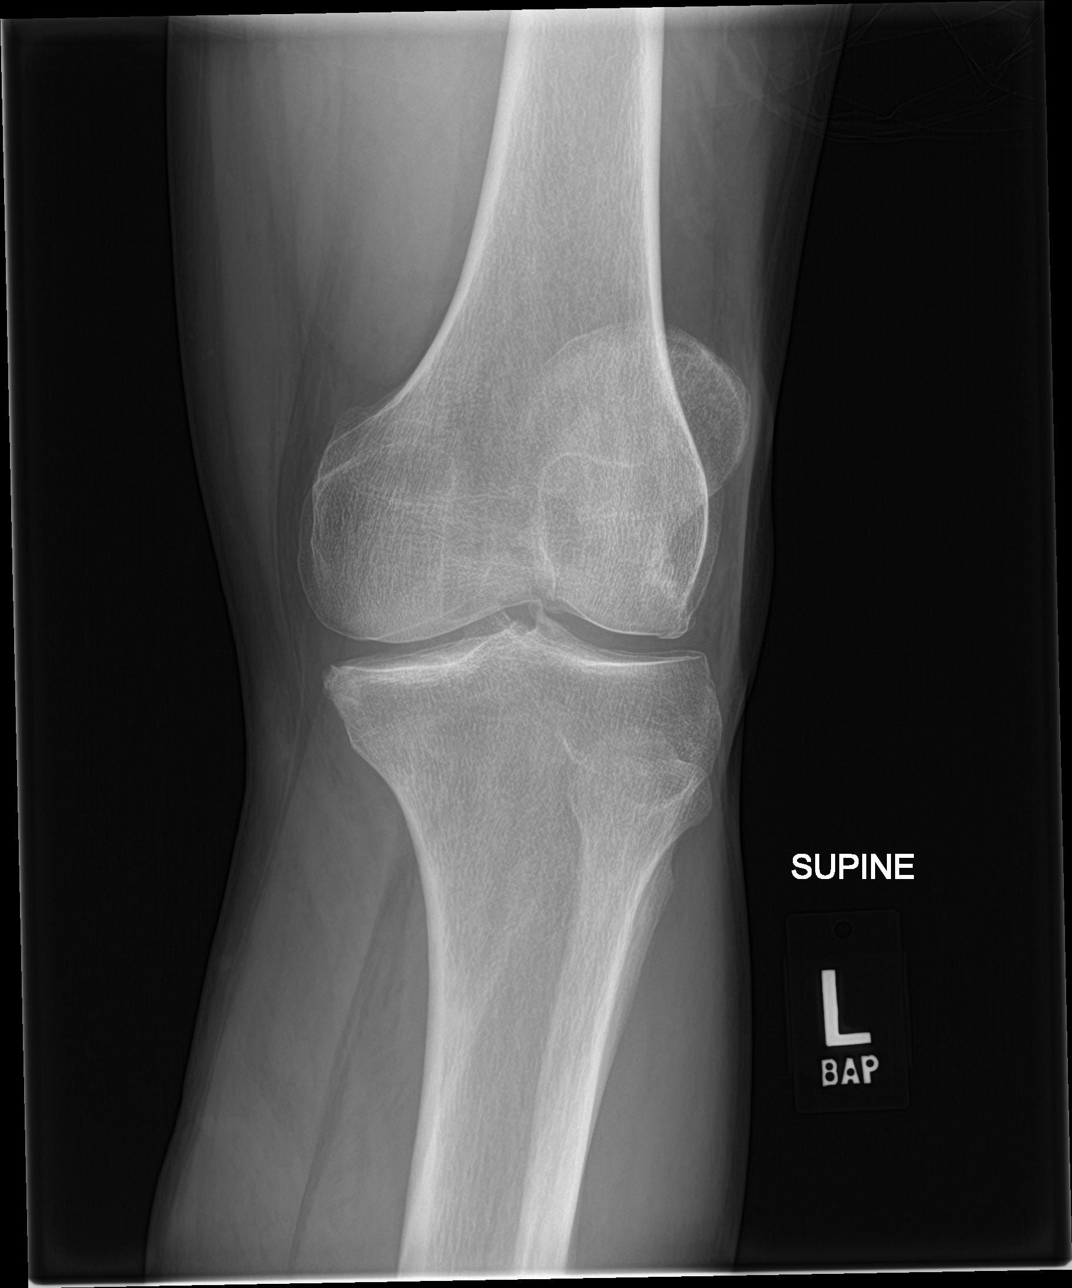

[4 of 4 positions shown; findings below may reference images not displayed]

FINDINGS: Moderate medial and lateral tibial spine spur formation. Minimal
medial, lateral and patellofemoral spur formation. Moderate anterior
patellar spur formation. No fracture, dislocation or effusion seen.
IMPRESSION: No fracture.  Mild degenerative changes.

## 2019-03-21 ENCOUNTER — Ambulatory Visit: Payer: Non-veteran care | Attending: Internal Medicine

## 2019-03-21 DIAGNOSIS — Z20822 Contact with and (suspected) exposure to covid-19: Secondary | ICD-10-CM

## 2019-03-22 LAB — NOVEL CORONAVIRUS, NAA: SARS-CoV-2, NAA: NOT DETECTED

## 2021-10-17 ENCOUNTER — Ambulatory Visit (HOSPITAL_COMMUNITY)
Admission: EM | Admit: 2021-10-17 | Discharge: 2021-10-17 | Disposition: A | Discharge status: Home / Self Care | Payer: No Typology Code available for payment source | Attending: Sports Medicine | Admitting: Sports Medicine

## 2021-10-17 ENCOUNTER — Encounter (HOSPITAL_COMMUNITY): Payer: Self-pay | Admitting: Emergency Medicine

## 2021-10-17 DIAGNOSIS — M25562 Pain in left knee: Secondary | ICD-10-CM
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
## 2021-10-17 NOTE — Discharge Instructions (Addendum)
For your knee pain, continue with tylenol and ice after work on and off with elevation. If symptoms do no improve follow up with PCP or sports medicine.

## 2021-11-24 ENCOUNTER — Other Ambulatory Visit: Payer: Self-pay

## 2021-11-24 ENCOUNTER — Emergency Department (HOSPITAL_COMMUNITY): Payer: No Typology Code available for payment source

## 2021-11-24 ENCOUNTER — Encounter (HOSPITAL_COMMUNITY): Payer: Self-pay | Admitting: Emergency Medicine

## 2021-11-24 ENCOUNTER — Emergency Department (HOSPITAL_COMMUNITY)
Admission: EM | Admit: 2021-11-24 | Discharge: 2021-11-24 | Disposition: A | Discharge status: Home / Self Care | Payer: No Typology Code available for payment source | Attending: Emergency Medicine | Admitting: Emergency Medicine

## 2021-11-24 DIAGNOSIS — E119 Type 2 diabetes mellitus without complications: Secondary | ICD-10-CM | POA: Diagnosis not present

## 2021-11-24 DIAGNOSIS — R0682 Tachypnea, not elsewhere classified: Secondary | ICD-10-CM | POA: Diagnosis not present

## 2021-11-24 DIAGNOSIS — Z794 Long term (current) use of insulin: Secondary | ICD-10-CM | POA: Diagnosis not present

## 2021-11-24 DIAGNOSIS — Z7901 Long term (current) use of anticoagulants: Secondary | ICD-10-CM | POA: Diagnosis not present

## 2021-11-24 DIAGNOSIS — Z7984 Long term (current) use of oral hypoglycemic drugs: Secondary | ICD-10-CM | POA: Diagnosis not present

## 2021-11-24 DIAGNOSIS — Z79899 Other long term (current) drug therapy: Secondary | ICD-10-CM | POA: Diagnosis not present

## 2021-11-24 DIAGNOSIS — F1210 Cannabis abuse, uncomplicated: Secondary | ICD-10-CM | POA: Insufficient documentation

## 2021-11-24 DIAGNOSIS — I10 Essential (primary) hypertension: Secondary | ICD-10-CM | POA: Insufficient documentation

## 2021-11-24 DIAGNOSIS — R0789 Other chest pain: Secondary | ICD-10-CM | POA: Diagnosis present

## 2021-11-24 LAB — CBC
HCT: 37.7 % — ABNORMAL LOW (ref 39.0–52.0)
Hemoglobin: 13.4 g/dL (ref 13.0–17.0)
MCH: 33.6 pg (ref 26.0–34.0)
MCHC: 35.5 g/dL (ref 30.0–36.0)
MCV: 94.5 fL (ref 80.0–100.0)
Platelets: 226 10*3/uL (ref 150–400)
RBC: 3.99 MIL/uL — ABNORMAL LOW (ref 4.22–5.81)
RDW: 13 % (ref 11.5–15.5)
WBC: 4.8 10*3/uL (ref 4.0–10.5)
nRBC: 0 % (ref 0.0–0.2)

## 2021-11-24 LAB — RAPID URINE DRUG SCREEN, HOSP PERFORMED
Amphetamines: NOT DETECTED
Barbiturates: NOT DETECTED
Benzodiazepines: NOT DETECTED
Cocaine: NOT DETECTED
Opiates: NOT DETECTED
Tetrahydrocannabinol: POSITIVE — AB

## 2021-11-24 LAB — LIPASE, BLOOD: Lipase: 47 U/L (ref 11–51)

## 2021-11-24 LAB — BASIC METABOLIC PANEL
Anion gap: 10 (ref 5–15)
BUN: 15 mg/dL (ref 6–20)
CO2: 25 mmol/L (ref 22–32)
Calcium: 9.3 mg/dL (ref 8.9–10.3)
Chloride: 104 mmol/L (ref 98–111)
Creatinine, Ser: 1.37 mg/dL — ABNORMAL HIGH (ref 0.61–1.24)
GFR, Estimated: >60 mL/min (ref >60)
Glucose, Bld: 115 mg/dL — ABNORMAL HIGH (ref 70–99)
Potassium: 3.5 mmol/L (ref 3.5–5.1)
Sodium: 139 mmol/L (ref 135–145)

## 2021-11-24 LAB — HEPATIC FUNCTION PANEL
ALT: 26 U/L (ref 0–44)
AST: 28 U/L (ref 15–41)
Albumin: 4 g/dL (ref 3.5–5.0)
Alkaline Phosphatase: 75 U/L (ref 38–126)
Bilirubin, Direct: 0.1 mg/dL (ref 0.0–0.2)
Indirect Bilirubin: 0.6 mg/dL (ref 0.3–0.9)
Total Bilirubin: 0.7 mg/dL (ref 0.3–1.2)
Total Protein: 7 g/dL (ref 6.5–8.1)

## 2021-11-24 LAB — D-DIMER, QUANTITATIVE: D-Dimer, Quant: 0.39 ug/mL-FEU (ref 0.00–0.50)

## 2021-11-24 LAB — TROPONIN I (HIGH SENSITIVITY)
Troponin I (High Sensitivity): 5 ng/L (ref <18)
Troponin I (High Sensitivity): 5 ng/L (ref <18)

## 2021-11-24 MED ORDER — FAMOTIDINE 20 MG PO TABS
20.0000 mg | ORAL_TABLET | Freq: Once | ORAL | Status: AC
  Administered 2021-11-24: 20 mg via ORAL
  Filled 2021-11-24: qty 1

## 2021-11-24 MED ORDER — MORPHINE SULFATE (PF) 4 MG/ML IV SOLN
4.0000 mg | Freq: Once | INTRAVENOUS | Status: AC
  Administered 2021-11-24: 4 mg via INTRAVENOUS
  Filled 2021-11-24: qty 1

## 2021-11-24 MED ORDER — SUCRALFATE 1 G PO TABS
1.0000 g | ORAL_TABLET | Freq: Once | ORAL | Status: AC
  Administered 2021-11-24: 1 g via ORAL
  Filled 2021-11-24: qty 1

## 2021-11-24 MED ORDER — ACETAMINOPHEN 500 MG PO TABS
1000.0000 mg | ORAL_TABLET | Freq: Once | ORAL | Status: AC
  Administered 2021-11-24: 1000 mg via ORAL
  Filled 2021-11-24: qty 2

## 2021-11-24 MED ORDER — LIDOCAINE VISCOUS HCL 2 % MT SOLN: 15.0000 mL | Freq: Once | OROMUCOSAL | Status: DC

## 2021-11-24 MED ORDER — DICYCLOMINE HCL 20 MG PO TABS: 10.0000 mg | ORAL_TABLET | Freq: Once | ORAL | Status: DC

## 2021-11-24 MED ORDER — ALUM & MAG HYDROXIDE-SIMETH 200-200-20 MG/5ML PO SUSP: 30.0000 mL | Freq: Once | ORAL | Status: DC

## 2021-11-24 MED ORDER — ALUM & MAG HYDROXIDE-SIMETH 200-200-20 MG/5ML PO SUSP
30.0000 mL | Freq: Once | ORAL | Status: AC
  Administered 2021-11-24: 30 mL via ORAL
  Filled 2021-11-24: qty 30

## 2021-11-24 NOTE — Discharge Instructions (Addendum)
Please call your PCP tomorrow to schedule a follow up visit to readdress your symptoms.   If you develop worsening pain, feel free to return to the emergency department

## 2021-11-24 NOTE — ED Triage Notes (Signed)
Per [facility] pt coming from work c/o chest pain/ epigastric pain for the past year but worse around 1am. Initial CBG 85 ems administered 12.5 of D10 and CBG up to 212. Patient has tardive dyskinesia and has been having tremors and difficulty speaking for over a year now. Patient was given 1 nitro and 324 aspirin with no change in symptoms. EMS states patient was diaphoretic en route but has stopped since arrival.

## 2021-11-24 NOTE — ED Provider Notes (Signed)
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 male.  Patient is presenting to the ED with chest pain.  He says he started noticing this around 1 AM last night while he was working.  He says he was sitting at the time.  He says it is in the center of his chest and does not radiate.  He describes as a sharp stabbing pain.  He rates it 8 out of 10. the pain has been constant.  He was given nitroglycerin and aspirin on the way here, but he has not had any relief from this.  Never had pain like this before.  He states he has associated shortness of breath.  He does note that he has had some swelling in his left leg, but this has been going on for several months now and is not new.  Patient denies any abdominal pain, nausea, vomiting, dizziness, fevers, chills, cough, hemoptysis.   No history of active malignancy. He has developed tardive dyskinesia over the past year and has difficulty with speech and movements. This has remained unchanged.    Chest Pain Associated symptoms: shortness of breath        Home Medications Prior to Admission medications   Medication Sig Start Date End Date Taking? Authorizing Provider  acetaminophen (TYLENOL) 650 MG CR tablet Take 1,300 mg by mouth every 8 (eight) hours as needed for pain.    [provider]  capsaicin (ZOSTRIX) 0.025 % cream Apply 1 application topically 2 (two) times daily as needed (muscle pain).     [provider]  Cyanocobalamin (VITAMIN B-12 PO) Take 1 tablet by mouth daily.    [provider]  DULoxetine (CYMBALTA) 60 MG capsule Take 60 mg by mouth daily.    [provider]  finasteride (PROSCAR) 5 MG tablet Take 5 mg by mouth daily.     [provider]  glipiZIDE (GLUCOTROL) 5 MG tablet Take 5 mg by mouth 2 (two) times daily before a meal.    [provider]  insulin glargine (LANTUS) 100 UNIT/ML injection Inject 0.35 mLs (35 Units total) into the skin at bedtime. Patient taking differently: Inject 35 Units into the skin daily before breakfast.  08/19/15   Derrill Center, NP  lisinopril (PRINIVIL,ZESTRIL) 10 MG tablet Take 10 mg by mouth daily.    [provider]  metFORMIN (GLUCOPHAGE-XR) 750 MG 24 hr tablet Take 1,500 mg by mouth daily with breakfast.     [provider]  Multiple Vitamins-Minerals (MULTIVITAMIN WITH MINERALS) tablet Take 1 tablet by mouth daily. 09/08/13   Burkett, Claretta Fraise, NP  pravastatin (PRAVACHOL) 20 MG tablet Take 20 mg by mouth at bedtime.     [provider]  risperiDONE (RISPERDAL) 0.5 MG tablet Take 0.5 mg by mouth at bedtime.    [provider]  rivaroxaban (XARELTO) 20 MG TABS tablet Take 1 tablet (20 mg total) by mouth daily with supper. Patient taking differently: Take 20 mg by mouth daily with breakfast.  09/08/13   Burkett, Claretta Fraise, NP  tamsulosin (FLOMAX) 0.4 MG CAPS capsule Take 1 capsule (0.4 mg total) by mouth at bedtime. Patient taking differently: Take 0.8 mg by mouth at bedtime.  09/08/13  Burkett, Evanna, NP  ipratropium (ATROVENT) 0.03 % nasal spray Place 2 sprays into the nose 2 (two) times daily. PRN congestion 03/23/12 03/23/12  Muthersbaugh, Dahlia ClientHannah, PA-C      Allergies    Metformin    Review of Systems   Review of Systems  Respiratory:  Positive for shortness of breath.   Cardiovascular:  Positive for chest pain.  All other systems reviewed and are negative.   Physical Exam Updated Vital Signs BP (!) 146/80   Pulse (!) 51   Temp 98.4 F (36.9 C)   Resp 20   Ht 5\' 8"  (1.727 m)   Wt 72.6 kg   SpO2 100%   BMI 24.33 kg/m  Physical Exam Vitals and nursing note reviewed.  Constitutional:      General: He is not in acute  distress.    Appearance: Normal appearance. He is well-developed. He is not ill-appearing, toxic-appearing or diaphoretic.  HENT:     Head: Normocephalic and atraumatic.     Nose: No nasal deformity.     Mouth/Throat:     Lips: Pink. No lesions.  Eyes:     General: Gaze aligned appropriately. No scleral icterus.       Right eye: No discharge.        Left eye: No discharge.     Conjunctiva/sclera: Conjunctivae normal.     Right eye: Right conjunctiva is not injected. No exudate or hemorrhage.    Left eye: Left conjunctiva is not injected. No exudate or hemorrhage. Neck:     Vascular: No JVD.  Cardiovascular:     Rate and Rhythm: Normal rate and regular rhythm.     Pulses:          Radial pulses are 2+ on the right side and 2+ on the left side.       Posterior tibial pulses are 2+ on the right side and 2+ on the left side.     Heart sounds: Normal heart sounds. Heart sounds not distant. No murmur heard.    No systolic murmur is present.     No diastolic murmur is present.     No friction rub. No gallop. No S3 or S4 sounds.  Pulmonary:     Effort: Pulmonary effort is normal. Tachypnea present. No accessory muscle usage or respiratory distress.     Breath sounds: Normal breath sounds. No stridor. No decreased breath sounds, wheezing, rhonchi or rales.     Comments: Auscultation technically difficult due to tardive dyskinesia Seems mildly dyspneic Chest:     Chest wall: No tenderness.     Comments: CP not reproducible Abdominal:     Palpations: Abdomen is soft. There is no mass.     Tenderness: There is no abdominal tenderness. There is no guarding or rebound.     Comments: Negative Murphy Sign  Musculoskeletal:     Right lower leg: No tenderness. No edema.     Left lower leg: No tenderness. No edema.     Comments: I did not appreciate any assymetric edema in LLE per patients report  Skin:    General: Skin is warm and dry.  Neurological:     General: No focal deficit present.      Mental Status: He is alert and oriented to person, place, and time.     Comments: Patient has difficulty with speech. This is baseline.  Psychiatric:        Mood and Affect: Mood normal.  Speech: Speech normal.        Behavior: Behavior normal. Behavior is cooperative.     ED Results / Procedures / Treatments   Labs (all labs ordered are listed, but only abnormal results are displayed) Labs Reviewed  BASIC METABOLIC PANEL - Abnormal; Notable for the following components:      Result Value   Glucose, Bld 115 (*)    Creatinine, Ser 1.37 (*)    All other components within normal limits  CBC - Abnormal; Notable for the following components:   RBC 3.99 (*)    HCT 37.7 (*)    All other components within normal limits  RAPID URINE DRUG SCREEN, HOSP PERFORMED - Abnormal; Notable for the following components:   Tetrahydrocannabinol POSITIVE (*)    All other components within normal limits  HEPATIC FUNCTION PANEL  LIPASE, BLOOD  D-DIMER, QUANTITATIVE  TROPONIN I (HIGH SENSITIVITY)  TROPONIN I (HIGH SENSITIVITY)    EKG EKG Interpretation  Date/Time:  Sunday November 24 2021 08:24:57 EDT Ventricular Rate:  56 PR Interval:  198 QRS Duration: 103 QT Interval:  392 QTC Calculation: 379 R Axis:   31 Text Interpretation: Sinus rhythm Nonspecific ST abnormality Confirmed by Cathren Laine ([id number]) on 11/24/2021 9:22:04 AM  Radiology DG Chest Port 1 View  Result Date: 11/24/2021 CLINICAL DATA:  Chest pain EXAM: PORTABLE CHEST 1 VIEW COMPARISON:  04/10/2015 FINDINGS: Transverse diameter of heart is increased. There are no signs of pulmonary edema or focal pulmonary consolidation. There is no pleural effusion or pneumothorax. IMPRESSION: No active disease. Electronically Signed   By: Ernie Avena M.D.   On: 11/24/2021 09:17    Procedures Procedures  This patient was on telemetry or cardiac monitoring during their time in the ED.    Medications Ordered in  ED Medications  morphine (PF) 4 MG/ML injection 4 mg (4 mg Intravenous Given 11/24/21 0934)  alum & mag hydroxide-simeth (MAALOX/MYLANTA) 200-200-20 MG/5ML suspension 30 mL (30 mLs Oral Given 11/24/21 1123)  famotidine (PEPCID) tablet 20 mg (20 mg Oral Given 11/24/21 1123)  acetaminophen (TYLENOL) tablet 1,000 mg (1,000 mg Oral Given 11/24/21 1123)  sucralfate (CARAFATE) tablet 1 g (1 g Oral Given 11/24/21 1237)    ED Course/ Medical Decision Making/ A&P                           Medical Decision Making Amount and/or Complexity of Data Reviewed Labs: ordered. Radiology: ordered.  Risk OTC drugs. Prescription drug management.    MDM  This is a 56 y.o. male who presents to the ED with chest pain and shortness of breath The differential of this patient includes but is not limited to ACS/NSTEMI, Aortic Dissection, PE, PTX, Tamponade, GERD, MSK, Pericarditis, PNA, and CHF  Initial Impression  Patient is with baseline tardive dyskinesia neurological symptoms are unchanged from baseline.  He is slightly dyspneic at rest, but has overall unremarkable exam otherwise. He is mildly hypertensive, afebrile, minimally bradycardic, tachypenic to 22, O2 100% on RA  Plan to work up for ACS, obtain CXR, and basic labs.  I have lower suspicion for PE given that he has been compliant with home Xaralto and not hypoxic. Symptoms seem atypical for PE, but with shortness of breath accompanying chest pain and hx of PE, will obtain d dimer.  Pain refractory to NTG. Will give IV morphine as well as treat with GI cocktail.  I personally ordered, reviewed, and interpreted all laboratory work and imaging  and agree with radiologist interpretation. Results interpreted below: Creatinine 1.37 which is stable from baseline, LFTs normal, D-dimer normal, troponin negative x2, drug screen positive for THC.  Chest x-ray without any acute abnormalities.  EKG unchanged from baseline and non ischemic  pattern.  Assessment/Plan:  Patient presenting with chest pain. He is HDS and no acute distress. Exam unremarkable. Workup is largely unremarkable. ACS less likely at this point. Doubt PE with negative dimer. Doubt dissection. Symptoms have improved with GI cocktail. No emergent pathology identified today and pain seems reasonably controlled. I do not thin patient needs to be admitted as chest pain is atypical. I have recommended close outpatient follow up with PCP.    Charting Requirements Additional history is obtained from:  Independent historian External Records from outside source obtained and reviewed including[facility] Social Determinants of Health:  none Pertinant PMH that complicates patient's illness: tardive dyskinesia, hx of blood clots  Patient Care Problems that were addressed during this visit: - Atypical chest pain: Acute illness with systemic symptoms This patient was maintained on a cardiac monitor/telemetry. I personally viewed and interpreted the cardiac monitor which reveals an underlying rhythm of NSR Medications given in ED: Morphine, GI cocktail Reevaluation of the patient after these medicines showed that the patient improved I have reviewed home medications and made changes accordingly.  Critical Care Interventions: n/a Consultations: n/a Disposition: discharge  This is a shared visit with my attending physician, Dr. Denton Lank.  We have discussed this patient and they have independently evaluated this patient. The plan was altered or changed as needed.  Portions of this note were generated with Scientist, clinical (histocompatibility and immunogenetics). Dictation errors may occur despite best attempts at proofreading.     Final Clinical Impression(s) / ED Diagnoses Final diagnoses:  Atypical chest pain    Rx / DC Orders ED Discharge Orders     None         Claudie Leach, PA-C 11/24/21 1451    Cathren Laine, MD 11/25/21 1515

## 2022-10-08 ENCOUNTER — Encounter (HOSPITAL_COMMUNITY): Payer: Self-pay

## 2022-10-08 ENCOUNTER — Emergency Department (HOSPITAL_COMMUNITY): Payer: No Typology Code available for payment source

## 2022-10-08 ENCOUNTER — Emergency Department (HOSPITAL_COMMUNITY)
Admission: EM | Admit: 2022-10-08 | Discharge: 2022-10-08 | Disposition: A | Discharge status: Home / Self Care | Payer: No Typology Code available for payment source | Attending: Emergency Medicine | Admitting: Emergency Medicine

## 2022-10-08 DIAGNOSIS — Z79899 Other long term (current) drug therapy: Secondary | ICD-10-CM | POA: Insufficient documentation

## 2022-10-08 DIAGNOSIS — I10 Essential (primary) hypertension: Secondary | ICD-10-CM | POA: Insufficient documentation

## 2022-10-08 DIAGNOSIS — Z720 Tobacco use: Secondary | ICD-10-CM | POA: Diagnosis not present

## 2022-10-08 DIAGNOSIS — R0789 Other chest pain: Secondary | ICD-10-CM | POA: Diagnosis not present

## 2022-10-08 DIAGNOSIS — R079 Chest pain, unspecified: Secondary | ICD-10-CM

## 2022-10-08 DIAGNOSIS — E119 Type 2 diabetes mellitus without complications: Secondary | ICD-10-CM | POA: Diagnosis not present

## 2022-10-08 DIAGNOSIS — Z794 Long term (current) use of insulin: Secondary | ICD-10-CM | POA: Diagnosis not present

## 2022-10-08 DIAGNOSIS — Z7984 Long term (current) use of oral hypoglycemic drugs: Secondary | ICD-10-CM | POA: Insufficient documentation

## 2022-10-08 LAB — BASIC METABOLIC PANEL
Anion gap: 7 (ref 5–15)
BUN: 16 mg/dL (ref 6–20)
CO2: 25 mmol/L (ref 22–32)
Calcium: 9.1 mg/dL (ref 8.9–10.3)
Chloride: 105 mmol/L (ref 98–111)
Creatinine, Ser: 1.12 mg/dL (ref 0.61–1.24)
GFR, Estimated: >60 mL/min (ref >60)
Glucose, Bld: 74 mg/dL (ref 70–99)
Potassium: 4.2 mmol/L (ref 3.5–5.1)
Sodium: 137 mmol/L (ref 135–145)

## 2022-10-08 LAB — TROPONIN I (HIGH SENSITIVITY)
Troponin I (High Sensitivity): 3 ng/L (ref <18)
Troponin I (High Sensitivity): 3 ng/L (ref <18)

## 2022-10-08 LAB — CBC
HCT: 38 % — ABNORMAL LOW (ref 39.0–52.0)
Hemoglobin: 13.1 g/dL (ref 13.0–17.0)
MCH: 34.3 pg — ABNORMAL HIGH (ref 26.0–34.0)
MCHC: 34.5 g/dL (ref 30.0–36.0)
MCV: 99.5 fL (ref 80.0–100.0)
Platelets: 250 10*3/uL (ref 150–400)
RBC: 3.82 MIL/uL — ABNORMAL LOW (ref 4.22–5.81)
RDW: 12.9 % (ref 11.5–15.5)
WBC: 4.6 10*3/uL (ref 4.0–10.5)
nRBC: 0 % (ref 0.0–0.2)

## 2022-10-08 MED ORDER — LIDOCAINE VISCOUS HCL 2 % MT SOLN
15.0000 mL | Freq: Once | OROMUCOSAL | Status: AC
  Administered 2022-10-08: 15 mL via ORAL
  Filled 2022-10-08: qty 15

## 2022-10-08 MED ORDER — KETOROLAC TROMETHAMINE 15 MG/ML IJ SOLN
15.0000 mg | Freq: Once | INTRAMUSCULAR | Status: AC
  Administered 2022-10-08: 15 mg via INTRAMUSCULAR
  Filled 2022-10-08: qty 1

## 2022-10-08 MED ORDER — ALUM & MAG HYDROXIDE-SIMETH 200-200-20 MG/5ML PO SUSP
30.0000 mL | Freq: Once | ORAL | Status: AC
  Administered 2022-10-08: 30 mL via ORAL
  Filled 2022-10-08: qty 30

## 2022-10-08 NOTE — ED Provider Notes (Signed)
[place] EMERGENCY DEPARTMENT AT [facility] Provider Note   CSN: [id number] Arrival date & time: 10/08/22  0006     History  Chief Complaint  Patient presents with   Chest Pain    [name] is a 57 y.o. male with past medical history significant for hypertension, diabetes, depression, tobacco use, cocaine use, previous cocaine induced mood disorder who presents with concern for chest pain, abdominal pain, back pain which is acute on chronic in nature.  He reports the chest pain began at around 10 PM tonight, he reports that he was just sitting around when the pain started.  He had eaten a couple of hours before.  He denies any nausea, radiation of pain into the neck, left arm.  He has not taken anything for the pain.  He denies previous history of heart attack.   Chest Pain      Home Medications Prior to Admission medications   Medication Sig Start Date End Date Taking? Authorizing Provider  acetaminophen (TYLENOL) 650 MG CR tablet Take 1,300 mg by mouth every 8 (eight) hours as needed for pain.    [provider]  capsaicin (ZOSTRIX) 0.025 % cream Apply 1 application topically 2 (two) times daily as needed (muscle pain).     [provider]  Cyanocobalamin (VITAMIN B-12 PO) Take 1 tablet by mouth daily.    [provider]  DULoxetine (CYMBALTA) 60 MG capsule Take 60 mg by mouth daily.    [provider]  finasteride (PROSCAR) 5 MG tablet Take 5 mg by mouth daily.    [provider]  glipiZIDE (GLUCOTROL) 5 MG tablet Take 5 mg by mouth 2 (two) times daily before a meal.    [provider]  insulin glargine (LANTUS) 100 UNIT/ML injection Inject 0.35 mLs (35 Units total) into the skin at bedtime. Patient taking differently: Inject 35 Units into the skin daily before breakfast.  08/19/15   Oneta Rack, NP  lisinopril (PRINIVIL,ZESTRIL) 10 MG tablet Take 10 mg by mouth daily.    [provider]   metFORMIN (GLUCOPHAGE-XR) 750 MG 24 hr tablet Take 1,500 mg by mouth daily with breakfast.     [provider]  Multiple Vitamins-Minerals (MULTIVITAMIN WITH MINERALS) tablet Take 1 tablet by mouth daily. 09/08/13   Burkett, Gypsy Balsam, NP  pravastatin (PRAVACHOL) 20 MG tablet Take 20 mg by mouth at bedtime.     [provider]  risperiDONE (RISPERDAL) 0.5 MG tablet Take 0.5 mg by mouth at bedtime.    [provider]  rivaroxaban (XARELTO) 20 MG TABS tablet Take 1 tablet (20 mg total) by mouth daily with supper. Patient taking differently: Take 20 mg by mouth daily with breakfast.  09/08/13   Burkett, Gypsy Balsam, NP  tamsulosin (FLOMAX) 0.4 MG CAPS capsule Take 1 capsule (0.4 mg total) by mouth at bedtime. Patient taking differently: Take 0.8 mg by mouth at bedtime.  09/08/13   Burkett, Gypsy Balsam, NP  ipratropium (ATROVENT) 0.03 % nasal spray Place 2 sprays into the nose 2 (two) times daily. PRN congestion 03/23/12 03/23/12  Muthersbaugh, Dahlia Client, PA-C      Allergies    Metformin    Review of Systems   Review of Systems  Cardiovascular:  Positive for chest pain.  All other systems reviewed and are negative.   Physical Exam Updated Vital Signs BP 136/81   Pulse 65   Temp 98.3 F (36.8 C)   Resp 16   SpO2 100%  Physical  Exam Vitals and nursing note reviewed.  Constitutional:      General: He is not in acute distress.    Appearance: Normal appearance.  HENT:     Head: Normocephalic and atraumatic.  Eyes:     General:        Right eye: No discharge.        Left eye: No discharge.  Cardiovascular:     Rate and Rhythm: Normal rate and regular rhythm.     Heart sounds: No murmur heard.    No friction rub. No gallop.  Pulmonary:     Effort: Pulmonary effort is normal.     Breath sounds: Normal breath sounds.  Chest:     Comments: Significant tenderness to palpation of the chest wall Abdominal:     General: Bowel sounds are normal.     Palpations: Abdomen is soft.   Skin:    General: Skin is warm and dry.     Capillary Refill: Capillary refill takes less than 2 seconds.  Neurological:     Mental Status: He is alert and oriented to person, place, and time.  Psychiatric:        Mood and Affect: Mood normal.        Behavior: Behavior normal.     ED Results / Procedures / Treatments   Labs (all labs ordered are listed, but only abnormal results are displayed) Labs Reviewed  CBC - Abnormal; Notable for the following components:      Result Value   RBC 3.82 (*)    HCT 38.0 (*)    MCH 34.3 (*)    All other components within normal limits  BASIC METABOLIC PANEL  TROPONIN I (HIGH SENSITIVITY)  TROPONIN I (HIGH SENSITIVITY)    EKG EKG Interpretation Date/Time:  Wednesday October 08 2022 00:30:17 EDT Ventricular Rate:  64 PR Interval:  174 QRS Duration:  90 QT Interval:  366 QTC Calculation: 377 R Axis:   32  Text Interpretation: Normal sinus rhythm ST elevation, consider early repolarization, pericarditis, or injury Abnormal ECG When compared with ECG of 08-Oct-2022 00:14, No significant change was found Confirmed by Dione Booze ([id number]) on 10/08/2022 5:17:11 AM  Radiology DG Chest 2 View  Result Date: 10/08/2022 CLINICAL DATA:  Chest pain EXAM: CHEST - 2 VIEW COMPARISON:  11/24/2021 FINDINGS: The heart size and mediastinal contours are within normal limits. Both lungs are clear. The visualized skeletal structures are unremarkable. IMPRESSION: No active cardiopulmonary disease. Electronically Signed   By: Jasmine Pang M.D.   On: 10/08/2022 01:24    Procedures Procedures    Medications Ordered in ED Medications  alum & mag hydroxide-simeth (MAALOX/MYLANTA) 200-200-20 MG/5ML suspension 30 mL (30 mLs Oral Given 10/08/22 0421)    And  lidocaine (XYLOCAINE) 2 % viscous mouth solution 15 mL (15 mLs Oral Given 10/08/22 0421)  ketorolac (TORADOL) 15 MG/ML injection 15 mg (15 mg Intramuscular Given 10/08/22 0421)    ED Course/ Medical Decision  Making/ A&P                                 Medical Decision Making Amount and/or Complexity of Data Reviewed Labs: ordered. Radiology: ordered.  Risk OTC drugs. Prescription drug management.   This patient is a 57 y.o. male  who presents to the ED for concern of chest pain.   Differential diagnoses prior to evaluation: The emergent differential diagnosis includes, but is not limited to,  ACS, AAS, PE, Mallory-Weiss, Boerhaave's, Pneumonia, acute bronchitis, asthma or COPD exacerbation, anxiety, MSK pain or traumatic injury to the chest, acid reflux versus other . This is not an exhaustive differential.   Past Medical History / Co-morbidities / Social History:  hypertension, diabetes, depression, tobacco use, cocaine use, previous cocaine induced mood disorder  Additional history: Chart reviewed. Pertinent results include: Reviewed lab work, imaging from previous emergency department visits and evaluations for chest pain  Endorses still taking his anticoagulation  Physical Exam: Physical exam performed. The pertinent findings include: No significant tenderness to palpation of chest wall, no tachycardia, no abnormal respiration  Lab Tests/Imaging studies: I personally interpreted labs/imaging and the pertinent results include: CBC unremarkable, BMP unremarkable, troponin negative at 3 with no delta.  No longer having active chest pain on reevaluation..  I independently interpreted plain film chest x-ray which shows no evidence of acute intrathoracic abnormality.  I agree with the radiologist interpretation.  Cardiac monitoring: EKG obtained and interpreted by myself and attending physician which shows: Normal sinus rhythm with no significant change from baseline   Medications: I ordered medication including Maalox, Mylanta, Toradol for atypical chest pain, patient with improvement after the above.  I have reviewed the patients home medicines and have made adjustments as needed.    Disposition: After consideration of the diagnostic results and the patients response to treatment, I feel that patient stable for discharge with no evidence of acute ACS, his story is not overall compatible with typical chest pain with no exertional component, no shortness of breath, nausea, radiation, he does have a heart score of 2-3 based on risk factors alone, do not think additional cardiac workup is warranted at this time with chest pain resolved and unremarkable workup in the emergency department, suspect likely GERD versus musculoskeletal.   emergency department workup does not suggest an emergent condition requiring admission or immediate intervention beyond what has been performed at this time. The plan is: as above. The patient is safe for discharge and has been instructed to return immediately for worsening symptoms, change in symptoms or any other concerns.  Final Clinical Impression(s) / ED Diagnoses Final diagnoses:  Chest pain, unspecified type    Rx / DC Orders ED Discharge Orders     None         [place] 10/08/22 0535    Dione Booze, MD 10/08/22 [phone]

## 2022-10-08 NOTE — ED Notes (Signed)
Pt taken back to triage room to obtain a second EKG.

## 2022-10-08 NOTE — Discharge Instructions (Signed)
Please use Tylenol or ibuprofen for pain.  You may use 600 mg ibuprofen every 6 hours or 1000 mg of Tylenol every 6 hours.  You may choose to alternate between the 2.  This would be most effective.  Not to exceed 4 g of Tylenol within 24 hours.  Not to exceed 3200 mg ibuprofen 24 hours.  

## 2022-10-08 NOTE — ED Notes (Signed)
Second EKG obtain and given to Dr.Rees

## 2022-10-08 NOTE — ED Triage Notes (Signed)
Pt is coming in for chest pain, abdominal pain and back pain that has been a chronic ongoing issue for him for the last year, He states tonight it has worsened. He also attributes this increase/chronicity of his pain to his Schizo medication he takes. Pt is otherwise stable, and mentions no other complaints.   Medic Vitals   140/82 74hr 85bgl 95%ra 18rr

## 2024-03-29 ENCOUNTER — Other Ambulatory Visit (HOSPITAL_COMMUNITY): Payer: Self-pay | Admitting: Urology

## 2024-03-29 DIAGNOSIS — R972 Elevated prostate specific antigen [PSA]: Secondary | ICD-10-CM

## 2024-04-14 ENCOUNTER — Ambulatory Visit: Admitting: Orthopaedic Surgery

## 2024-04-21 ENCOUNTER — Ambulatory Visit (HOSPITAL_COMMUNITY)

## 2024-04-26 ENCOUNTER — Ambulatory Visit: Admitting: Orthopaedic Surgery
